# Patient Record
Sex: Female | Born: 1945
Health system: Southern US, Community
[De-identification: ages and names within clinical notes are randomized; demographics above are authoritative.]

## PROBLEM LIST (undated history)

## (undated) DIAGNOSIS — Z961 Presence of intraocular lens: Secondary | ICD-10-CM

## (undated) DIAGNOSIS — H43813 Vitreous degeneration, bilateral: Secondary | ICD-10-CM

## (undated) DIAGNOSIS — N3010 Interstitial cystitis (chronic) without hematuria: Secondary | ICD-10-CM

## (undated) DIAGNOSIS — H527 Unspecified disorder of refraction: Secondary | ICD-10-CM

## (undated) DIAGNOSIS — Z86010 Personal history of colonic polyps: Secondary | ICD-10-CM

## (undated) DIAGNOSIS — K5792 Diverticulitis of intestine, part unspecified, without perforation or abscess without bleeding: Secondary | ICD-10-CM

## (undated) DIAGNOSIS — H40003 Preglaucoma, unspecified, bilateral: Secondary | ICD-10-CM

## (undated) DIAGNOSIS — F329 Major depressive disorder, single episode, unspecified: Secondary | ICD-10-CM

## (undated) DIAGNOSIS — K219 Gastro-esophageal reflux disease without esophagitis: Secondary | ICD-10-CM

## (undated) DIAGNOSIS — H35372 Puckering of macula, left eye: Secondary | ICD-10-CM

## (undated) DIAGNOSIS — N951 Menopausal and female climacteric states: Secondary | ICD-10-CM

## (undated) DIAGNOSIS — T7840XA Allergy, unspecified, initial encounter: Secondary | ICD-10-CM

## (undated) DIAGNOSIS — M542 Cervicalgia: Secondary | ICD-10-CM

## (undated) DIAGNOSIS — M1990 Unspecified osteoarthritis, unspecified site: Secondary | ICD-10-CM

## (undated) DIAGNOSIS — C4360 Malignant melanoma of unspecified upper limb, including shoulder: Secondary | ICD-10-CM

## (undated) HISTORY — DX: Interstitial cystitis (chronic) without hematuria: N30.10

## (undated) HISTORY — DX: Preglaucoma, unspecified, bilateral: H40.003

## (undated) HISTORY — DX: Personal history of colonic polyps: Z86.010

## (undated) HISTORY — DX: Malignant melanoma of unspecified upper limb, including shoulder: C43.60

## (undated) HISTORY — DX: Diverticulitis of intestine, part unspecified, without perforation or abscess without bleeding: K57.92

## (undated) HISTORY — PX: COLONOSCOPY: SHX174

## (undated) HISTORY — DX: Unspecified disorder of refraction: H52.7

## (undated) HISTORY — DX: Gastro-esophageal reflux disease without esophagitis: K21.9

## (undated) HISTORY — PX: TONSILLECTOMY: SHX5217

## (undated) HISTORY — DX: Vitreous degeneration, bilateral: H43.813

## (undated) HISTORY — PX: INCISION AND DRAINAGE ABSCESS ANAL: SUR669

## (undated) HISTORY — DX: Unspecified osteoarthritis, unspecified site: M19.90

## (undated) HISTORY — PX: UPPER GASTROINTESTINAL ENDOSCOPY: SHX188

## (undated) HISTORY — DX: Presence of intraocular lens: Z96.1

## (undated) HISTORY — PX: POLYPECTOMY: SHX149

## (undated) HISTORY — PX: VAGINAL HYSTERECTOMY: SUR661

## (undated) HISTORY — DX: Puckering of macula, left eye: H35.372

## (undated) HISTORY — DX: Menopausal and female climacteric states: N95.1

## (undated) HISTORY — DX: Allergy, unspecified, initial encounter: T78.40XA

## (undated) HISTORY — DX: Major depressive disorder, single episode, unspecified: F32.9

## (undated) HISTORY — DX: Cervicalgia: M54.2

---

## 1998-08-01 ENCOUNTER — Ambulatory Visit (HOSPITAL_COMMUNITY): Admission: RE | Admit: 1998-08-01 | Discharge: 1998-08-01 | Payer: Self-pay | Admitting: *Deleted

## 1998-10-14 ENCOUNTER — Ambulatory Visit (HOSPITAL_COMMUNITY): Admission: RE | Admit: 1998-10-14 | Discharge: 1998-10-14 | Payer: Self-pay | Admitting: Internal Medicine

## 1999-06-25 ENCOUNTER — Ambulatory Visit (HOSPITAL_BASED_OUTPATIENT_CLINIC_OR_DEPARTMENT_OTHER): Admission: RE | Admit: 1999-06-25 | Discharge: 1999-06-25 | Payer: Self-pay | Admitting: *Deleted

## 1999-10-21 ENCOUNTER — Ambulatory Visit (HOSPITAL_COMMUNITY): Admission: RE | Admit: 1999-10-21 | Discharge: 1999-10-21 | Payer: Self-pay | Admitting: Internal Medicine

## 1999-10-21 ENCOUNTER — Encounter: Payer: Self-pay | Admitting: Internal Medicine

## 2000-07-06 ENCOUNTER — Encounter: Payer: Self-pay | Admitting: Internal Medicine

## 2000-07-15 ENCOUNTER — Encounter (INDEPENDENT_AMBULATORY_CARE_PROVIDER_SITE_OTHER): Payer: Self-pay | Admitting: Specialist

## 2000-07-15 ENCOUNTER — Ambulatory Visit (HOSPITAL_COMMUNITY): Admission: RE | Admit: 2000-07-15 | Discharge: 2000-07-15 | Payer: Self-pay | Admitting: *Deleted

## 2000-10-25 ENCOUNTER — Encounter: Payer: Self-pay | Admitting: Internal Medicine

## 2000-10-25 ENCOUNTER — Ambulatory Visit (HOSPITAL_COMMUNITY): Admission: RE | Admit: 2000-10-25 | Discharge: 2000-10-25 | Payer: Self-pay | Admitting: Internal Medicine

## 2003-03-28 ENCOUNTER — Other Ambulatory Visit: Admission: RE | Admit: 2003-03-28 | Discharge: 2003-03-28 | Payer: Self-pay | Admitting: *Deleted

## 2003-04-12 ENCOUNTER — Ambulatory Visit (HOSPITAL_COMMUNITY): Admission: RE | Admit: 2003-04-12 | Discharge: 2003-04-12 | Payer: Self-pay | Admitting: *Deleted

## 2004-11-04 ENCOUNTER — Ambulatory Visit: Payer: Self-pay | Admitting: Internal Medicine

## 2005-03-26 ENCOUNTER — Ambulatory Visit: Payer: Self-pay | Admitting: Internal Medicine

## 2005-04-13 ENCOUNTER — Ambulatory Visit: Payer: Self-pay | Admitting: Internal Medicine

## 2005-04-28 ENCOUNTER — Ambulatory Visit: Payer: Self-pay | Admitting: Family Medicine

## 2005-12-30 ENCOUNTER — Ambulatory Visit: Payer: Self-pay | Admitting: Internal Medicine

## 2006-04-01 ENCOUNTER — Ambulatory Visit: Payer: Self-pay | Admitting: Internal Medicine

## 2006-04-08 ENCOUNTER — Ambulatory Visit: Payer: Self-pay | Admitting: Internal Medicine

## 2007-02-06 ENCOUNTER — Encounter: Payer: Self-pay | Admitting: Internal Medicine

## 2007-04-07 ENCOUNTER — Ambulatory Visit: Payer: Self-pay | Admitting: Internal Medicine

## 2007-04-07 LAB — CONVERTED CEMR LAB
ALT: 30 units/L (ref 0–40)
AST: 30 units/L (ref 0–37)
Albumin: 4.2 g/dL (ref 3.5–5.2)
Alkaline Phosphatase: 69 units/L (ref 39–117)
BUN: 12 mg/dL (ref 6–23)
Basophils Absolute: 0 10*3/uL (ref 0.0–0.1)
Basophils Relative: 0.6 % (ref 0.0–1.0)
Bilirubin, Direct: 0.1 mg/dL (ref 0.0–0.3)
CO2: 31 meq/L (ref 19–32)
Calcium: 9.6 mg/dL (ref 8.4–10.5)
Chloride: 106 meq/L (ref 96–112)
Cholesterol: 212 mg/dL (ref 0–200)
Creatinine, Ser: 0.8 mg/dL (ref 0.4–1.2)
Direct LDL: 110.5 mg/dL
Eosinophils Absolute: 0.3 10*3/uL (ref 0.0–0.6)
Eosinophils Relative: 4.5 % (ref 0.0–5.0)
GFR calc Af Amer: 94 mL/min
GFR calc non Af Amer: 78 mL/min
Glucose, Bld: 82 mg/dL (ref 70–99)
HCT: 40.3 % (ref 36.0–46.0)
HDL: 68.6 mg/dL (ref >39.0)
Hemoglobin: 13.7 g/dL (ref 12.0–15.0)
Lymphocytes Relative: 28.5 % (ref 12.0–46.0)
MCHC: 34 g/dL (ref 30.0–36.0)
MCV: 93.3 fL (ref 78.0–100.0)
Monocytes Absolute: 0.5 10*3/uL (ref 0.2–0.7)
Monocytes Relative: 7.2 % (ref 3.0–11.0)
Neutro Abs: 4.4 10*3/uL (ref 1.4–7.7)
Neutrophils Relative %: 59.2 % (ref 43.0–77.0)
Platelets: 357 10*3/uL (ref 150–400)
Potassium: 3.9 meq/L (ref 3.5–5.1)
RBC: 4.32 M/uL (ref 3.87–5.11)
RDW: 11.9 % (ref 11.5–14.6)
Sodium: 143 meq/L (ref 135–145)
TSH: 2.48 microintl units/mL (ref 0.35–5.50)
Total Bilirubin: 0.9 mg/dL (ref 0.3–1.2)
Total CHOL/HDL Ratio: 3.1
Total Protein: 7.4 g/dL (ref 6.0–8.3)
Triglycerides: 75 mg/dL (ref 0–149)
VLDL: 15 mg/dL (ref 0–40)
WBC: 7.3 10*3/uL (ref 4.5–10.5)

## 2007-04-13 ENCOUNTER — Ambulatory Visit: Payer: Self-pay | Admitting: Internal Medicine

## 2007-04-21 ENCOUNTER — Encounter: Payer: Self-pay | Admitting: Internal Medicine

## 2007-04-21 DIAGNOSIS — M542 Cervicalgia: Secondary | ICD-10-CM

## 2007-04-21 DIAGNOSIS — K612 Anorectal abscess: Secondary | ICD-10-CM

## 2007-04-21 DIAGNOSIS — J309 Allergic rhinitis, unspecified: Secondary | ICD-10-CM | POA: Insufficient documentation

## 2007-04-21 DIAGNOSIS — Z860100 Personal history of colon polyps, unspecified: Secondary | ICD-10-CM

## 2007-04-21 DIAGNOSIS — N951 Menopausal and female climacteric states: Secondary | ICD-10-CM

## 2007-04-21 DIAGNOSIS — C4360 Malignant melanoma of unspecified upper limb, including shoulder: Secondary | ICD-10-CM

## 2007-04-21 HISTORY — DX: Menopausal and female climacteric states: N95.1

## 2007-04-21 HISTORY — DX: Personal history of colon polyps, unspecified: Z86.0100

## 2007-04-21 HISTORY — DX: Personal history of colonic polyps: Z86.010

## 2007-04-21 HISTORY — DX: Cervicalgia: M54.2

## 2007-04-21 HISTORY — DX: Malignant melanoma of unspecified upper limb, including shoulder: C43.60

## 2007-08-30 ENCOUNTER — Encounter: Payer: Self-pay | Admitting: Internal Medicine

## 2007-12-25 ENCOUNTER — Encounter: Payer: Self-pay | Admitting: Internal Medicine

## 2008-01-30 ENCOUNTER — Encounter: Payer: Self-pay | Admitting: Internal Medicine

## 2008-02-08 ENCOUNTER — Encounter: Payer: Self-pay | Admitting: Internal Medicine

## 2008-02-13 ENCOUNTER — Encounter: Payer: Self-pay | Admitting: Internal Medicine

## 2008-02-13 ENCOUNTER — Ambulatory Visit (HOSPITAL_COMMUNITY): Admission: RE | Admit: 2008-02-13 | Discharge: 2008-02-13 | Payer: Self-pay | Admitting: *Deleted

## 2008-02-13 ENCOUNTER — Encounter (INDEPENDENT_AMBULATORY_CARE_PROVIDER_SITE_OTHER): Payer: Self-pay | Admitting: *Deleted

## 2008-02-19 ENCOUNTER — Encounter: Payer: Self-pay | Admitting: Internal Medicine

## 2008-03-26 ENCOUNTER — Encounter: Payer: Self-pay | Admitting: Internal Medicine

## 2008-03-29 ENCOUNTER — Ambulatory Visit: Payer: Self-pay | Admitting: Internal Medicine

## 2008-03-29 LAB — CONVERTED CEMR LAB
ALT: 33 units/L (ref 0–35)
AST: 26 units/L (ref 0–37)
Albumin: 3.8 g/dL (ref 3.5–5.2)
Alkaline Phosphatase: 72 units/L (ref 39–117)
BUN: 13 mg/dL (ref 6–23)
Basophils Absolute: 0 10*3/uL (ref 0.0–0.1)
Basophils Relative: 0.1 % (ref 0.0–1.0)
Bilirubin Urine: NEGATIVE
Bilirubin, Direct: 0.1 mg/dL (ref 0.0–0.3)
CO2: 31 meq/L (ref 19–32)
Calcium: 9.4 mg/dL (ref 8.4–10.5)
Chloride: 106 meq/L (ref 96–112)
Cholesterol: 225 mg/dL (ref 0–200)
Creatinine, Ser: 0.7 mg/dL (ref 0.4–1.2)
Direct LDL: 134.9 mg/dL
Eosinophils Absolute: 0.4 10*3/uL (ref 0.0–0.7)
Eosinophils Relative: 5.6 % — ABNORMAL HIGH (ref 0.0–5.0)
GFR calc Af Amer: 109 mL/min
GFR calc non Af Amer: 90 mL/min
Glucose, Bld: 100 mg/dL — ABNORMAL HIGH (ref 70–99)
Glucose, Urine, Semiquant: NEGATIVE
HCT: 40.3 % (ref 36.0–46.0)
HDL: 64.1 mg/dL (ref >39.0)
Hemoglobin: 13.8 g/dL (ref 12.0–15.0)
Ketones, urine, test strip: NEGATIVE
Lymphocytes Relative: 26.8 % (ref 12.0–46.0)
MCHC: 34.2 g/dL (ref 30.0–36.0)
MCV: 92.3 fL (ref 78.0–100.0)
Monocytes Absolute: 0.5 10*3/uL (ref 0.1–1.0)
Monocytes Relative: 7.1 % (ref 3.0–12.0)
Neutro Abs: 3.9 10*3/uL (ref 1.4–7.7)
Neutrophils Relative %: 60.4 % (ref 43.0–77.0)
Nitrite: NEGATIVE
Platelets: 273 10*3/uL (ref 150–400)
Potassium: 4 meq/L (ref 3.5–5.1)
Protein, U semiquant: NEGATIVE
RBC: 4.36 M/uL (ref 3.87–5.11)
RDW: 11.4 % — ABNORMAL LOW (ref 11.5–14.6)
Sodium: 142 meq/L (ref 135–145)
Specific Gravity, Urine: 1.015
TSH: 1.7 microintl units/mL (ref 0.35–5.50)
Total Bilirubin: 0.7 mg/dL (ref 0.3–1.2)
Total CHOL/HDL Ratio: 3.5
Total Protein: 7.3 g/dL (ref 6.0–8.3)
Triglycerides: 107 mg/dL (ref 0–149)
Urobilinogen, UA: 0.2
VLDL: 21 mg/dL (ref 0–40)
WBC: 6.5 10*3/uL (ref 4.5–10.5)
pH: 6.5

## 2008-04-08 ENCOUNTER — Ambulatory Visit: Payer: Self-pay | Admitting: Internal Medicine

## 2008-04-08 DIAGNOSIS — K219 Gastro-esophageal reflux disease without esophagitis: Secondary | ICD-10-CM | POA: Insufficient documentation

## 2008-04-08 HISTORY — DX: Gastro-esophageal reflux disease without esophagitis: K21.9

## 2008-04-18 ENCOUNTER — Telehealth: Payer: Self-pay | Admitting: Internal Medicine

## 2009-05-19 ENCOUNTER — Ambulatory Visit: Payer: Self-pay | Admitting: Internal Medicine

## 2009-05-26 ENCOUNTER — Ambulatory Visit: Payer: Self-pay | Admitting: Internal Medicine

## 2009-05-26 DIAGNOSIS — F329 Major depressive disorder, single episode, unspecified: Secondary | ICD-10-CM

## 2009-05-26 DIAGNOSIS — F418 Other specified anxiety disorders: Secondary | ICD-10-CM | POA: Insufficient documentation

## 2009-05-26 DIAGNOSIS — F3289 Other specified depressive episodes: Secondary | ICD-10-CM

## 2009-05-26 HISTORY — DX: Major depressive disorder, single episode, unspecified: F32.9

## 2009-05-26 HISTORY — DX: Other specified depressive episodes: F32.89

## 2009-06-02 ENCOUNTER — Telehealth: Payer: Self-pay | Admitting: Internal Medicine

## 2010-05-11 ENCOUNTER — Encounter: Payer: Self-pay | Admitting: Internal Medicine

## 2010-05-20 ENCOUNTER — Encounter: Payer: Self-pay | Admitting: Internal Medicine

## 2010-05-21 ENCOUNTER — Encounter: Payer: Self-pay | Admitting: Internal Medicine

## 2010-08-25 ENCOUNTER — Telehealth: Payer: Self-pay

## 2010-08-27 ENCOUNTER — Encounter: Payer: Self-pay | Admitting: Internal Medicine

## 2010-08-27 ENCOUNTER — Ambulatory Visit: Payer: Self-pay | Admitting: Internal Medicine

## 2010-08-27 LAB — CONVERTED CEMR LAB
ALT: 22 units/L (ref 0–35)
AST: 25 units/L (ref 0–37)
Albumin: 4.1 g/dL (ref 3.5–5.2)
Alkaline Phosphatase: 80 units/L (ref 39–117)
BUN: 12 mg/dL (ref 6–23)
Basophils Absolute: 0 10*3/uL (ref 0.0–0.1)
Basophils Relative: 0.7 % (ref 0.0–3.0)
Bilirubin, Direct: 0 mg/dL (ref 0.0–0.3)
CO2: 29 meq/L (ref 19–32)
Calcium: 9.4 mg/dL (ref 8.4–10.5)
Chloride: 105 meq/L (ref 96–112)
Cholesterol: 223 mg/dL — ABNORMAL HIGH (ref 0–200)
Creatinine, Ser: 0.7 mg/dL (ref 0.4–1.2)
Direct LDL: 139.4 mg/dL
Eosinophils Absolute: 0.4 10*3/uL (ref 0.0–0.7)
Eosinophils Relative: 5.7 % — ABNORMAL HIGH (ref 0.0–5.0)
GFR calc non Af Amer: 89.19 mL/min (ref >60)
Glucose, Bld: 91 mg/dL (ref 70–99)
HCT: 39.5 % (ref 36.0–46.0)
HDL: 57.5 mg/dL (ref >39.00)
Hemoglobin: 13.5 g/dL (ref 12.0–15.0)
Lymphocytes Relative: 27.9 % (ref 12.0–46.0)
Lymphs Abs: 1.9 10*3/uL (ref 0.7–4.0)
MCHC: 34.3 g/dL (ref 30.0–36.0)
MCV: 94.2 fL (ref 78.0–100.0)
Monocytes Absolute: 0.5 10*3/uL (ref 0.1–1.0)
Monocytes Relative: 7.1 % (ref 3.0–12.0)
Neutro Abs: 4.1 10*3/uL (ref 1.4–7.7)
Neutrophils Relative %: 58.6 % (ref 43.0–77.0)
Platelets: 271 10*3/uL (ref 150.0–400.0)
Potassium: 4.9 meq/L (ref 3.5–5.1)
RBC: 4.19 M/uL (ref 3.87–5.11)
RDW: 12 % (ref 11.5–14.6)
Sodium: 140 meq/L (ref 135–145)
TSH: 3.06 microintl units/mL (ref 0.35–5.50)
Total Bilirubin: 0.3 mg/dL (ref 0.3–1.2)
Total CHOL/HDL Ratio: 4
Total Protein: 6.8 g/dL (ref 6.0–8.3)
Triglycerides: 144 mg/dL (ref 0.0–149.0)
VLDL: 28.8 mg/dL (ref 0.0–40.0)
Vit D, 25-Hydroxy: 52 ng/mL (ref 30–89)
WBC: 7 10*3/uL (ref 4.5–10.5)

## 2010-09-24 ENCOUNTER — Telehealth: Payer: Self-pay | Admitting: Internal Medicine

## 2011-01-03 LAB — CONVERTED CEMR LAB
ALT: 23 units/L (ref 0–35)
AST: 24 units/L (ref 0–37)
Albumin: 3.9 g/dL (ref 3.5–5.2)
Alkaline Phosphatase: 66 units/L (ref 39–117)
BUN: 15 mg/dL (ref 6–23)
Basophils Absolute: 0.1 10*3/uL (ref 0.0–0.1)
Basophils Relative: 1.3 % (ref 0.0–3.0)
Bilirubin Urine: NEGATIVE
Bilirubin, Direct: 0.1 mg/dL (ref 0.0–0.3)
CO2: 30 meq/L (ref 19–32)
Calcium: 9.3 mg/dL (ref 8.4–10.5)
Chloride: 106 meq/L (ref 96–112)
Cholesterol: 207 mg/dL — ABNORMAL HIGH (ref 0–200)
Creatinine, Ser: 0.7 mg/dL (ref 0.4–1.2)
Direct LDL: 117.3 mg/dL
Eosinophils Absolute: 0.3 10*3/uL (ref 0.0–0.7)
Eosinophils Relative: 6.5 % — ABNORMAL HIGH (ref 0.0–5.0)
GFR calc non Af Amer: 89.54 mL/min (ref >60)
Glucose, Bld: 97 mg/dL (ref 70–99)
Glucose, Urine, Semiquant: NEGATIVE
HCT: 37.4 % (ref 36.0–46.0)
HDL: 68.7 mg/dL (ref >39.00)
Hemoglobin: 13.2 g/dL (ref 12.0–15.0)
Ketones, urine, test strip: NEGATIVE
Lymphocytes Relative: 30.7 % (ref 12.0–46.0)
Lymphs Abs: 1.6 10*3/uL (ref 0.7–4.0)
MCHC: 35.2 g/dL (ref 30.0–36.0)
MCV: 92.8 fL (ref 78.0–100.0)
Monocytes Absolute: 0.4 10*3/uL (ref 0.1–1.0)
Monocytes Relative: 7.6 % (ref 3.0–12.0)
Neutro Abs: 2.8 10*3/uL (ref 1.4–7.7)
Neutrophils Relative %: 53.9 % (ref 43.0–77.0)
Nitrite: NEGATIVE
Platelets: 236 10*3/uL (ref 150.0–400.0)
Potassium: 4.6 meq/L (ref 3.5–5.1)
Protein, U semiquant: NEGATIVE
RBC: 4.03 M/uL (ref 3.87–5.11)
RDW: 12.4 % (ref 11.5–14.6)
Sodium: 137 meq/L (ref 135–145)
Specific Gravity, Urine: 1.015
TSH: 2.03 microintl units/mL (ref 0.35–5.50)
Total Bilirubin: 0.6 mg/dL (ref 0.3–1.2)
Total CHOL/HDL Ratio: 3
Total Protein: 7.4 g/dL (ref 6.0–8.3)
Triglycerides: 124 mg/dL (ref 0.0–149.0)
Urobilinogen, UA: 0.2
VLDL: 24.8 mg/dL (ref 0.0–40.0)
WBC: 5.2 10*3/uL (ref 4.5–10.5)
pH: 6

## 2011-01-07 NOTE — Assessment & Plan Note (Signed)
Summary: emp/pt coming in fasting/cjr   Vital Signs:  Patient profile:   65 year old female Height:      61.75 inches Weight:      150 pounds BMI:     27.76 Temp:     98.1 degrees F oral BP sitting:   120 / 72  (right arm) Cuff size:   regular  Vitals Entered By: Duard Brady LPN (August 27, 2010 8:44 AM) CC: cpx - doing well  Is Patient Diabetic? No   CC:  cpx - doing well .  History of Present Illness: 65 year old patient who is in today for a wellness exam.  She is followed by gynecology and has a scheduled appointment next month.  She has had a mammogram this year.  She has a history of colonic polyps allergic rhinitis and gastroesophageal reflux disease.  She remains on low-dose hormone replacement therapy and is now on Estrace 1 mg 3 times per week.  She anticipates this p.m., tapered at her next GYN visit. Here for Medicare AWV:  1.   Risk factors based on Past M, S, F history:  no significant cardiovascular risk factors. 2.   Physical Activities: enjoys a 3-minute exercise regimen, almost every day 3.   Depression/mood: history depression, which is now clinically stable has required treatment in the past.  Presently takes alprazolam p.r.n. anxiety 4.   Hearing: no impairment 5.   ADL's: independent in all aspects of daily living 6.   Fall Risk: low 7.   Home Safety: proms identifying 8.   Height, weight, &visual acuity:height and weight stable.  No problems with visual acuity 9.   Counseling: regular exercise, calcium supplementation with vitamin D.  All encouraged 10.   Labs ordered based on risk factors: laboratory profile, including lipid panel will be reviewed 11.           Referral Coordination- follow-up GYN as scheduled 12.           Care Plan- colonoscopy due in 3 years 13.            Cognitive Assessment- alert and oriented normal affect.  No memory disturbance.  No difficulty handling all executive functions   Allergies: 1)  ! Biaxin 2)  Amoxicillin  (Amoxicillin) 3)  Erythromycin Ethylsuccinate (Erythromycin Ethylsuccinate) 4)  Paxil (Paroxetine Hcl) 5)  Periostat (Doxycycline Hyclate)  Past History:  Past Medical History: Reviewed history from 05/26/2009 and no changes required. 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 details of her father's health unknown mother died age 21 from bladder cancer; also status post resection for lung cancer  One brother:  history of hypertension one sister.  History hypertension, obesity, and dyslipidemia, CNS hemorrhage  Aunt:  breast cancer, cerebral hemorrhage  Social History: Reviewed history from 04/08/2008 and no changes required. Married discontinued tobacco 20 years ago retired May 2009  Review of Systems  The patient denies anorexia, fever, weight loss, weight gain, vision loss, decreased hearing, hoarseness, chest pain, syncope, dyspnea on exertion, peripheral edema, prolonged cough, headaches, hemoptysis, abdominal pain, melena, hematochezia, severe indigestion/heartburn, hematuria, incontinence, genital sores, muscle weakness, suspicious skin lesions, transient blindness, difficulty walking, depression, unusual weight change, abnormal bleeding, enlarged lymph nodes, angioedema, and breast masses.    Physical Exam  General:  Well-developed,well-nourished,in no  acute distress; alert,appropriate and cooperative throughout examination Head:  Normocephalic and atraumatic without obvious abnormalities. No apparent alopecia or balding. Eyes:  No corneal or conjunctival inflammation noted. EOMI. Perrla. Funduscopic exam benign, without hemorrhages, exudates or papilledema. Vision grossly normal. Ears:   External ear exam shows no significant lesions or deformities.  Otoscopic examination reveals clear canals, tympanic membranes are intact bilaterally without bulging, retraction, inflammation or discharge. Hearing is grossly normal bilaterally. Nose:  External nasal examination shows no deformity or inflammation. Nasal mucosa are pink and moist without lesions or exudates. Mouth:  Oral mucosa and oropharynx without lesions or exudates.  Teeth in good repair. Neck:  No deformities, masses, or tenderness noted. Chest Wall:  No deformities, masses, or tenderness noted. Breasts:  No mass, nodules, thickening, tenderness, bulging, retraction, inflamation, nipple discharge or skin changes noted.   Lungs:  Normal respiratory effort, chest expands symmetrically. Lungs are clear to auscultation, no crackles or wheezes. Heart:  Normal rate and regular rhythm. S1 and S2 normal without gallop, murmur, click, rub or other extra sounds. Abdomen:  Bowel sounds positive,abdomen soft and non-tender without masses, organomegaly or hernias noted. Msk:  No deformity or scoliosis noted of thoracic or lumbar spine.   Pulses:  R and L carotid,radial,femoral,dorsalis pedis and posterior tibial pulses are full and equal bilaterally Extremities:  No clubbing, cyanosis, edema, or deformity noted with normal full range of motion of all joints.   Neurologic:  No cranial nerve deficits noted. Station and gait are normal. Plantar reflexes are down-going bilaterally. DTRs are symmetrical throughout. Sensory, motor and coordinative functions appear intact. Skin:  Intact without suspicious lesions or rashes Cervical Nodes:  No lymphadenopathy noted Axillary Nodes:  No palpable lymphadenopathy Inguinal Nodes:  No significant adenopathy Psych:  Cognition and judgment appear intact. Alert and cooperative with normal attention span and concentration. No apparent delusions, illusions, hallucinations   Impression &  Recommendations:  Problem # 1:  PHYSICAL EXAMINATION (ICD-V70.0)  Orders: EKG w/ Interpretation (93000) Medicare -1st Annual Wellness Visit [phone]) Venipuncture (60454) TLB-Lipid Panel (80061-LIPID) TLB-BMP (Basic Metabolic Panel-BMET) (80048-METABOL) TLB-CBC Platelet - w/Differential (85025-CBCD) TLB-Hepatic/Liver Function Pnl (80076-HEPATIC) TLB-TSH (Thyroid Stimulating Hormone) (84443-TSH)  Complete Medication List: 1)  Estrace 1 Mg Tabs (Estradiol) .... Take 1 tablet by mouth once a day 2)  Xanax 0.5 Mg Tabs (Alprazolam) .... Take 1 tablet by mouth once a day 3)  Prevacid 30 Mg Cpdr (Lansoprazole) .... One daily 4)  Flonase 50 Mcg/act Susp (Fluticasone propionate) .... Use daily 5)  Detrol La 4 Mg Xr24h-cap (Tolterodine tartrate) .... One daily  Other Orders: T-Vitamin D (25-Hydroxy) [phone])  Patient Instructions: 1)  Please schedule a follow-up appointment in 1 year. 2)  Avoid foods high in acid (tomatoes, citrus juices, spicy foods). Avoid eating within two hours of lying down or before exercising. Do not over eat; try smaller more frequent meals. Elevate head of bed twelve inches when sleeping. 3)  It is important that you exercise regularly at least 20 minutes 5 times a week. If you develop chest pain, have severe difficulty breathing, or feel very tired , stop exercising immediately and seek medical attention. 4)  Take calcium +Vitamin D daily. Prescriptions: DETROL LA 4 MG XR24H-CAP (TOLTERODINE TARTRATE) one daily  #90 x 4   Entered and Authorized by:   Gordy Savers  MD   Signed by:   Gordy Savers  MD on 08/27/2010   Method used:   Print then Give to Patient   RxID:  [id number] FLONASE 50 MCG/ACT  SUSP (FLUTICASONE PROPIONATE) use daily  #3 x 4   Entered and Authorized by:   Gordy Savers  MD   Signed by:   Gordy Savers  MD on 08/27/2010   Method used:   Print then Give to Patient   RxID:   [id number] PREVACID 30 MG   CPDR (LANSOPRAZOLE) one daily  #90 x 4   Entered and Authorized by:   Gordy Savers  MD   Signed by:   Gordy Savers  MD on 08/27/2010   Method used:   Print then Give to Patient   RxID:   [id number] Prudy Feeler 0.5 MG TABS (ALPRAZOLAM) Take 1 tablet by mouth once a day  #90 x 4   Entered and Authorized by:   Gordy Savers  MD   Signed by:   Gordy Savers  MD on 08/27/2010   Method used:   Print then Give to Patient   RxID:   [id number] ESTRACE 1 MG TABS (ESTRADIOL) Take 1 tablet by mouth once a day  #90 x 4   Entered and Authorized by:   Gordy Savers  MD   Signed by:   Gordy Savers  MD on 08/27/2010   Method used:   Print then Give to Patient   RxID:   [id number]   Appended Document: emp/pt coming in fasting/cjr  Flu Vaccine Consent Questions     Do you have a history of severe allergic reactions to this vaccine? no    Any prior history of allergic reactions to egg and/or gelatin? no    Do you have a sensitivity to the preservative Thimersol? no    Do you have a past history of Guillan-Barre Syndrome? no    Do you currently have an acute febrile illness? no    Have you ever had a severe reaction to latex? no    Vaccine information given and explained to patient? yes    Are you currently pregnant? no    Lot Number:[id number]   Exp Date:06/05/2011   Site Given  Left Deltoid IM    Allergies: 1)  ! Biaxin 2)  Amoxicillin (Amoxicillin) 3)  Erythromycin Ethylsuccinate (Erythromycin Ethylsuccinate) 4)  Paxil (Paroxetine Hcl) 5)  Periostat (Doxycycline Hyclate)   Complete Medication List: 1)  Estrace 1 Mg Tabs (Estradiol) .... Take 1 tablet by mouth once a day 2)  Xanax 0.5 Mg Tabs (Alprazolam) .... Take 1 tablet by mouth once a day 3)  Prevacid 30 Mg Cpdr (Lansoprazole) .... One daily 4)  Flonase 50 Mcg/act Susp (Fluticasone propionate) .... Use daily 5)  Detrol La 4 Mg Xr24h-cap (Tolterodine tartrate) .... One daily  Other  Orders: Flu Vaccine 59yrs + MEDICARE PATIENTS (O2703) Administration Flu vaccine - MCR (J0093)  Appended Document: Orders Update    Clinical Lists Changes  Orders: Added new Service order of Specimen Handling (81829) - Signed

## 2011-01-07 NOTE — Progress Notes (Signed)
Summary: Pt req to have Vit D lvl added to emp labs on 08/27/10  Phone Note Call from Patient Call back at [facility] Phone [phone] Call back at [phone] cell    pt says to call either number.    Caller: Patient Summary of Call: Pt called and is req to get Vit D lvl added to emp labs on 08/27/10. Pls advise. Pt said that theire last Vit D lvl was checked by [employer] June 2010 and it was lvl 16 and she was put on 50k units per week for 6 weeks.  Initial call taken by: Lucy Antigua,  August 25, 2010 11:42 AM  Follow-up for Phone Call        spoke with pt - will do at cpx on 9/22 . KIK Follow-up by: Duard Brady LPN,  August 25, 2010 12:11 PM

## 2011-01-07 NOTE — Letter (Signed)
Summary: [facility]  [facility]   Imported By: Maryln Gottron 05/19/2010 15:57:52  _____________________________________________________________________  External Attachment:    Type:   Image     Comment:   External Document

## 2011-01-07 NOTE — Procedures (Signed)
Summary: Upper GI Endoscopy/[facility]  Upper GI Endoscopy/[facility]   Imported By: Maryln Gottron 05/27/2010 11:27:58  _____________________________________________________________________  External Attachment:    Type:   Image     Comment:   External Document

## 2011-01-07 NOTE — Progress Notes (Signed)
Summary: bloodwork results  Phone Note Call from Patient Call back at Home Phone [phone]   Caller: Patient Call For: Gordy Savers  MD Summary of Call: pt needs blood work results Initial call taken by: Heron Sabins,  September 24, 2010 12:19 PM  Follow-up for Phone Call        pt informed Follow-up by: Willy Eddy, LPN,  September 24, 2010 12:43 PM    all ok including vit d level

## 2011-01-19 ENCOUNTER — Encounter: Payer: Self-pay | Admitting: Internal Medicine

## 2011-01-20 ENCOUNTER — Encounter: Payer: Self-pay | Admitting: Internal Medicine

## 2011-01-20 ENCOUNTER — Ambulatory Visit (INDEPENDENT_AMBULATORY_CARE_PROVIDER_SITE_OTHER): Payer: Medicare Other | Admitting: Internal Medicine

## 2011-01-20 DIAGNOSIS — J069 Acute upper respiratory infection, unspecified: Secondary | ICD-10-CM

## 2011-01-20 DIAGNOSIS — J309 Allergic rhinitis, unspecified: Secondary | ICD-10-CM

## 2011-01-20 NOTE — Progress Notes (Signed)
  Subjective:    Patient ID: Victoria Bryant, female    DOB: 24-Jul-1946, 65 y.o.   MRN: [id number]  HPI  And 65 year old patient who is seen today with aseveral day history of chest congestion and some minimal wheezing.  She does have a history of allergic rhinitis and has been using nasal steroids chronically.  No productive cough.  No fever no shortness of breath.  She has been using some Robitussin.    Review of Systems  Constitutional: Negative.   HENT: Negative for hearing loss, congestion, sore throat, rhinorrhea, dental problem, sinus pressure and tinnitus.   Eyes: Negative for pain, discharge and visual disturbance.  Respiratory: Positive for wheezing. Negative for cough and shortness of breath.   Cardiovascular: Negative for chest pain, palpitations and leg swelling.  Gastrointestinal: Negative for nausea, vomiting, abdominal pain, diarrhea, constipation, blood in stool and abdominal distention.  Genitourinary: Negative for dysuria, urgency, frequency, hematuria, flank pain, vaginal bleeding, vaginal discharge, difficulty urinating, vaginal pain and pelvic pain.  Musculoskeletal: Negative for joint swelling, arthralgias and gait problem.  Skin: Negative for rash.  Neurological: Negative for dizziness, syncope, speech difficulty, weakness, numbness and headaches.  Hematological: Negative for adenopathy.  Psychiatric/Behavioral: Negative for behavioral problems, dysphoric mood and agitation. The patient is not nervous/anxious.        Objective:   Physical Exam  Constitutional: She is oriented to person, place, and time. She appears well-developed and well-nourished.  HENT:  Head: Normocephalic.  Right Ear: External ear normal.  Left Ear: External ear normal.  Mouth/Throat: Oropharynx is clear and moist.  Eyes: Conjunctivae and EOM are normal. Pupils are equal, round, and reactive to light.  Neck: Normal range of motion. Neck supple. No thyromegaly present.  Cardiovascular:  Normal rate, regular rhythm, normal heart sounds and intact distal pulses.   Pulmonary/Chest: Effort normal and breath sounds normal. No respiratory distress. She has no wheezes. She has no rales. She exhibits no tenderness.  Abdominal: Soft. Bowel sounds are normal. She exhibits no mass. There is no tenderness.  Musculoskeletal: Normal range of motion.  Lymphadenopathy:    She has no cervical adenopathy.  Neurological: She is alert and oriented to person, place, and time.  Skin: Skin is warm and dry. No rash noted.  Psychiatric: She has a normal mood and affect. Her behavior is normal.          Assessment & Plan:  Viral URI- Will treat symptomatically will continue Robitussin will add a nonsedating antihistamine Allergic rhinitis

## 2011-01-20 NOTE — Patient Instructions (Signed)
Get plenty of rest, Drink lots of  clear liquids, and use Tylenol or ibuprofen for fever and discomfort.    Call or return to clinic prn if these symptoms worsen or fail to improve as anticipated.  

## 2011-04-20 NOTE — Letter (Signed)
Apr 13, 2007     RE:  RACHAL, DVORSKY  MRN:  [id number]  /  DOB:  08/20/1946   To whom it may concern:   Ms. Schermerhorn is a patient followed in our internal medicine practice.  She is 65 years old and has had in the past a history of neck pain.  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  She was last seen in our office  on Apr 13, 2007.  No complaints of neck pain.  She did have a cervical  spine x-ray series performed on Apr 12, 2006.  This revealed some mild  spondylosis with mild disk space narrowing and was really unremarkable.  The patient's cervical spine x-rays revealed no significant pathology,  and were compatible with her age.  Again, at the present time, she has  no symptoms referable to her neck.   I hope the above information is helpful.  If further details are  required, please do not hesitate to contact this office.    Sincerely,      Gordy Savers, MD  Electronically Signed    PFK/MedQ  DD: 04/13/2007  DT: 04/13/2007  Job #: [id number]

## 2011-04-20 NOTE — Op Note (Signed)
Victoria Bryant, Victoria Bryant            ACCOUNT NO.:  [id number]   MEDICAL RECORD NO.:  [id number]          PATIENT TYPE:  AMB   LOCATION:  ENDO                         FACILITY:  [facility]   PHYSICIAN:  Georgiana Spinner, M.D.    DATE OF BIRTH:  1946/01/17   DATE OF PROCEDURE:  02/13/2008  DATE OF DISCHARGE:                               OPERATIVE REPORT   PROCEDURE:  Upper endoscopy.   INDICATIONS:  GERD.   ANESTHESIA:  Demerol 75 mg; Versed 7.5 mg.   PROCEDURE:  With the patient mildly sedated in the left lateral  decubitus position, the Pentax videoscopic endoscope was inserted in the  mouth and passed under direct vision through the esophagus where there  was a [address] of what appeared to be Barrett's esophagus,  photographed and biopsied.  We entered into the stomach.  Fundus showed  erythematous changes scattered throughout the fundus that were  photographed and also biopsied.  We advanced to body, antrum, duodenal  bulb, second portion of duodenum, and all appeared normal.  From this  point the endoscope was slowly withdrawn, taking circumferential views  of the duodenal mucosa until the endoscope had been pulled back into the  stomach and placed in retroflexion to view the stomach from below.  The  endoscope was straightened and withdrawn, taking circumferential views  of the remaining gastric and esophageal mucosa.  The patient's vital  signs and pulse oximeter remained stable.  The patient tolerated the  procedure well without apparent complications.   FINDINGS:  Small island of what appeared to be Barrett's esophagus in  the distal esophagus photographed and biopsied.  Erythematous changes of  possible gastritis seen in the fundus of the stomach, somewhat scattered  in this in this area as well, also photographed and biopsied.  Await  biopsy reports.  The patient will call me for results and follow up with  me as an outpatient.  Proceed to colonoscopy as planned.      ______________________________  Georgiana Spinner, M.D.     GMO/MEDQ  D:  02/13/2008  T:  02/14/2008  Job:  [id number]   cc:   Gordy Savers, MD  [address] [place]  [place] [address]

## 2011-04-20 NOTE — Assessment & Plan Note (Signed)
[facility]                            [facility] OFFICE NOTE   NAME:Victoria Bryant, Victoria Bryant                   MRN:          [id number]  DATE:04/13/2007                            DOB:          05/23/1946    A 65 year old white female seen today for a wellness exam.  She is  followed by gynecology, also urology, and Dr. Virginia Rochester; is scheduled for  another colonoscopy in 2009.  She has a history of menopausal syndrome,  colonic polyps.  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   Medical regimen includes:  1. Hormone replacement therapy.  2. P.r.n. Xanax.   REVIEW OF SYSTEMS:  Exam is negative.  Did have a colonoscopy in 2004.   FAMILY HISTORY:  Unchanged.  Positive for hypertension and dyslipidemia.   EXAMINATION:  Revealed a healthy-appearing female, in no acute distress.  She was mildly overweight, blood pressure 130/80.  SKIN:  Negative.  FUNDI, EAR, NOSE, AND THROAT:  Clear.  NECK:  No adenopathy or bruits.  The right carotid upstroke was perhaps  slightly diminished.  CHEST:  Clear.  BREAST:  Negative.  CARDIOVASCULAR:  Normal heart sounds, no murmurs.  ABDOMEN:  Benign, no organomegaly.  EXTREMITIES:  Negative with intact peripheral pulses.  NEURO:  Negative.   IMPRESSION:  1. Colonic polyps.  2. Menopausal syndrome.  3. History of seasonal allergic rhinitis.   DISPOSITION:  Medical regimen unchanged, will recheck in one year.  Prescriptions dispensed.     Gordy Savers, MD  Electronically Signed    PFK/MedQ  DD: 04/13/2007  DT: 04/13/2007  Job #: [id number]

## 2011-04-20 NOTE — Op Note (Signed)
Victoria Bryant, FERGESON            ACCOUNT NO.:  [id number]   MEDICAL RECORD NO.:  [id number]          PATIENT TYPE:  AMB   LOCATION:  ENDO                         FACILITY:  [facility]   PHYSICIAN:  Georgiana Spinner, M.D.    DATE OF BIRTH:  [birth year]   DATE OF PROCEDURE:  DATE OF DISCHARGE:                               OPERATIVE REPORT   PROCEDURE:  Colonoscopy.   INDICATIONS:  Colon cancer screening and colon polyps.   ANESTHESIA:  Demerol 50 mg and Versed 5 mg.   PROCEDURE:  With the patient mildly sedated in the left lateral  decubitus position, the Pentax videoscopic colonoscope was inserted in  the rectum and passed under direct vision with pressure applied to reach  the cecum, identified by crow's foot of the cecum, ileocecal valve and  we entered into the terminal ileum through the ileocecal valve which  also appeared normal and was photographed.  From this point, the  colonoscope was slowly withdrawn, taking circumferential views of  colonic mucosa and stopping only in the rectum which appeared normal on  direct and showed hemorrhoids on retroflexed view.  The endoscope was  straightened and withdrawn.  The patient's vital signs and pulse  oximeter remained stable.  The patient tolerated the procedure well  without apparent complications.   FINDINGS:  Internal hemorrhoids.  Question of possibly early melanosis  coli.  Question laxative abuse.  We will discuss with the patient as an  outpatient.           ______________________________  Georgiana Spinner, M.D.     GMO/MEDQ  D:  02/13/2008  T:  02/14/2008  Job:  [id number]   cc:   Gordy Savers, MD  [address] [place]  [place] [address]

## 2011-04-23 NOTE — Op Note (Signed)
   NAME:  Victoria Bryant, Victoria Bryant                      ACCOUNT NO.:  [id number]   MEDICAL RECORD NO.:  [id number]                   PATIENT TYPE:  AMB   LOCATION:  ENDO                                 FACILITY:  [facility]   PHYSICIAN:  Georgiana Spinner, M.D.                 DATE OF BIRTH:  1946-10-30   DATE OF PROCEDURE:  04/12/2003  DATE OF DISCHARGE:                                 OPERATIVE REPORT   PROCEDURE:  Colonoscopy.   INDICATIONS FOR PROCEDURE:  History of colon polyps removed in the past.   ANESTHESIA:  Demerol 90, Versed 9 mg.   DESCRIPTION OF PROCEDURE:  With the patient mildly sedated in the left  lateral decubitus position, the Olympus videoscopic colonoscope was inserted  into the rectum and passed under direct vision to the cecum identified by  the ileocecal valve and appendiceal orifice. The prep was slightly  suboptimal. The patient spent an hour taking the prep only but visualization  was adequate. From this point, the colonoscope was slowly withdrawn taking  circumferential views of the entire colonic mucosa stopping then only in the  rectum which appeared normal on direct and showed large internal hemorrhoids  on retroflexed view. The endoscope was straightened and withdrawn. The  patient's vital signs and pulse oximeter remained stable. The patient  tolerated the procedure well without apparent complications.   FINDINGS:  Internal hemorrhoids, otherwise, unremarkable examination.   PLAN:  Repeat examination in five years.                                               Georgiana Spinner, M.D.    GMO/MEDQ  D:  04/12/2003  T:  04/12/2003  Job:  [id number]   cc:   Gordy Savers, M.D. [facility]

## 2011-04-23 NOTE — Procedures (Signed)
[facility]  Patient:    Victoria Bryant, BASTONE                   MRN: [id number] Proc. Date: 07/15/00 Adm. Date:  [id number] Attending:  Sabino Gasser CC:         Gordy Savers, M.D.   Procedure Report  PROCEDURE:  Upper endoscopy with biopsy.  INDICATIONS FOR PROCEDURE:  Abdominal pain.  ANESTHESIA:  Demerol 75 mg, Versed 6 mg given intravenously in divided dose.  See my clinical noted attached. The patient presented with abdominal pain not relieved by proton pump inhibitor different from her reflux pain.  DESCRIPTION OF PROCEDURE:  With the patient mildly sedated in the left lateral decubitus position, the Olympus video endoscope was inserted in the mouth and passed under direct vision through the esophagus which appeared normal until we reached the distal esophagus where there was a question of a small segment Barretts. The esophagus photographed and subsequently biopsied. We entered into the stomach and the fundus, body and antrum were well visualized. Photographs were taken. We entered into the duodenal bulb and second portion of the duodenum both of which appeared normal and were photographed. From this point, the endoscope was slowly withdrawn taking circumferential views of the entire duodenal mucosa until the endoscope was then pulled back into the stomach, placed in retroflexion to view the stomach from below and this showed a hiatal hernia. The endoscope was straightened and pulled back from distal to proximal stomach taking circumferential views of the entire gastric mucosa and there were changes in the fundus between the folds of villous type nature resembling intestinal metaplasia. This was photographed and biopsied and once obtained, the endoscope was then withdrawn taking circumferential views of the remaining gastric and esophageal mucosa.  The patients vital signs and pulse oximeter remained stable. The patient tolerated the  procedure well and there were no apparent complications.  FINDINGS:  Question of gastritis in the fundus of the stomach, photographed and biopsies taken. Await biopsy report. The patient will call me for results and follow-up with me as an outpatient. Continue present therapy. The patient continues to have abdominal pain.  PLAN:  As above. Proceed also to colonoscopy at the patients request. DD: 07/15/00 TD:  07/16/00 Job: [id number]

## 2011-04-23 NOTE — Procedures (Signed)
[facility]  Patient:    Victoria Bryant, Victoria Bryant                   MRN: [id number] Proc. Date: 07/15/00 Adm. Date:  [id number] Disc. Date: [id number] Attending:  Sabino Gasser                           Procedure Report  PROCEDURE PERFORMED:  Colonoscopy with biopsy.  ENDOSCOPIST:  Sabino Gasser, M.D.  INDICATIONS:  Colon polyps for followup.  ANESTHESIA:  Demerol an additional 25 mg and Versed 3 mg given intravenously in divided doses.  DESCRIPTION OF PROCEDURE:  With the patient mildly sedated in the left lateral decubitus position, the Olympus videoscopic colonoscope was inserted into the rectum and passed under direct vision to the cecum.  The cecum identified by the ileocecal valve and appendiceal orifice both of which were photographed. From this point, the colonoscope was slowly withdrawn taking circumferential views of the entire colonic mucosa stopping only then at approximately 10 cm from the anal verge at which point a small polyp was seen, photographed and removed using hot biopsy forceps technique with a setting of 20/20 blended current.  The endoscope was then withdrawn after taking a retroflex view of the rectum which appeared normal.  The patients vital signs and pulse oximeter remained stable.  The patient tolerated the procedure well without apparent complications.  FINDINGS:  Small polyp at 10 cm, await biopsy result.  PLAN:  The patient will call me for results and follow up with me as an outpatient. DD:  07/15/00 TD:  07/16/00 Job: [id number]

## 2011-09-20 ENCOUNTER — Encounter: Payer: Self-pay | Admitting: Internal Medicine

## 2011-09-20 ENCOUNTER — Ambulatory Visit (INDEPENDENT_AMBULATORY_CARE_PROVIDER_SITE_OTHER): Payer: Medicare Other | Admitting: Internal Medicine

## 2011-09-20 VITALS — BP 104/70 | HR 72 | Temp 98.2°F | Resp 16 | Ht 63.0 in | Wt 154.0 lb

## 2011-09-20 DIAGNOSIS — J309 Allergic rhinitis, unspecified: Secondary | ICD-10-CM

## 2011-09-20 DIAGNOSIS — Z136 Encounter for screening for cardiovascular disorders: Secondary | ICD-10-CM

## 2011-09-20 DIAGNOSIS — Z86010 Personal history of colonic polyps: Secondary | ICD-10-CM

## 2011-09-20 DIAGNOSIS — Z0000 Encounter for general adult medical examination without abnormal findings: Secondary | ICD-10-CM

## 2011-09-20 DIAGNOSIS — Z23 Encounter for immunization: Secondary | ICD-10-CM

## 2011-09-20 DIAGNOSIS — F329 Major depressive disorder, single episode, unspecified: Secondary | ICD-10-CM

## 2011-09-20 DIAGNOSIS — N951 Menopausal and female climacteric states: Secondary | ICD-10-CM

## 2011-09-20 DIAGNOSIS — K219 Gastro-esophageal reflux disease without esophagitis: Secondary | ICD-10-CM

## 2011-09-20 LAB — CBC WITH DIFFERENTIAL/PLATELET
Basophils Relative: 0.7 % (ref 0.0–3.0)
Eosinophils Absolute: 0.4 10*3/uL (ref 0.0–0.7)
Eosinophils Relative: 5.5 % — ABNORMAL HIGH (ref 0.0–5.0)
HCT: 39.9 % (ref 36.0–46.0)
Lymphs Abs: 2.2 10*3/uL (ref 0.7–4.0)
MCHC: 33.4 g/dL (ref 30.0–36.0)
MCV: 93.2 fl (ref 78.0–100.0)
Monocytes Absolute: 0.5 10*3/uL (ref 0.1–1.0)
Neutrophils Relative %: 56.7 % (ref 43.0–77.0)
Platelets: 275 10*3/uL (ref 150.0–400.0)
RBC: 4.28 Mil/uL (ref 3.87–5.11)
WBC: 7.4 10*3/uL (ref 4.5–10.5)

## 2011-09-20 LAB — COMPREHENSIVE METABOLIC PANEL
AST: 21 U/L (ref 0–37)
Albumin: 4.1 g/dL (ref 3.5–5.2)
Alkaline Phosphatase: 79 U/L (ref 39–117)
Glucose, Bld: 85 mg/dL (ref 70–99)
Potassium: 4.4 mEq/L (ref 3.5–5.1)
Sodium: 142 mEq/L (ref 135–145)
Total Bilirubin: 0.5 mg/dL (ref 0.3–1.2)
Total Protein: 7.3 g/dL (ref 6.0–8.3)

## 2011-09-20 LAB — LIPID PANEL
HDL: 62.7 mg/dL (ref >39.00)
Triglycerides: 167 mg/dL — ABNORMAL HIGH (ref 0.0–149.0)
VLDL: 33.4 mg/dL (ref 0.0–40.0)

## 2011-09-20 MED ORDER — FLUTICASONE PROPIONATE 50 MCG/ACT NA SUSP: 1.0000 | Freq: Every day | NASAL | Status: DC

## 2011-09-20 MED ORDER — ESTRADIOL 1 MG PO TABS: 1.0000 mg | ORAL_TABLET | Freq: Every day | ORAL | Status: DC

## 2011-09-20 MED ORDER — TOLTERODINE TARTRATE ER 4 MG PO CP24: 4.0000 mg | ORAL_CAPSULE | Freq: Every day | ORAL | Status: DC

## 2011-09-20 MED ORDER — PANTOPRAZOLE SODIUM 40 MG PO TBEC: 40.0000 mg | DELAYED_RELEASE_TABLET | Freq: Every day | ORAL | Status: DC

## 2011-09-20 MED ORDER — MELOXICAM 7.5 MG PO TABS: 7.5000 mg | ORAL_TABLET | Freq: Every day | ORAL | Status: AC

## 2011-09-20 MED ORDER — ALPRAZOLAM 0.5 MG PO TABS: 0.5000 mg | ORAL_TABLET | Freq: Every day | ORAL | Status: DC

## 2011-09-20 MED ORDER — MELOXICAM 7.5 MG PO TABS: 7.5000 mg | ORAL_TABLET | Freq: Every day | ORAL | Status: DC

## 2011-09-20 NOTE — Progress Notes (Signed)
Subjective:    Victoria Bryant ID: Victoria Bryant, female    DOB: 1946-06-13, 65 y.o.   MRN: [id number]  HPI  65 year old Victoria Bryant who is seen today for a preventative health examination. She enjoys excellent health. Her only complaint today is pain involving her left second toe this occurred after a considerable activity on her feet. She does have a history of hammertoe deformity and did have this cooperative on in the past. She has a history of anxiety depression and some chronic neck pain. She has a history of colonic polyps and allergic rhinitis she has remote history of a malignant melanoma. She had a mammogram last week and is scheduled for gynecologic followup tomorrow.  Here for Medicare AWV:   1. Risk factors based on Past M, S, F history: no significant cardiovascular risk factors.  2. Physical Activities: enjoys a 3-minute exercise regimen, almost every day  3. Depression/mood: history depression, which is now clinically stable has required treatment in the past. Presently takes alprazolam p.r.n. anxiety  4. Hearing: no impairment  5. ADL's: independent in all aspects of daily living  6. Fall Risk: low  7. Home Safety: No problems  identified  8. Height, weight, &visual acuity:height and weight stable. No problems with visual acuity  9. Counseling: regular exercise, calcium supplementation with vitamin D. All encouraged  10. Labs ordered based on risk factors: laboratory profile, including lipid panel will be reviewed  11. Referral Coordination- follow-up GYN as scheduled  12. Care Plan- colonoscopy due in 2 years  13. Cognitive Assessment- alert and oriented normal affect. No memory disturbance. No difficulty handling all executive functions   Allergies:  1) ! Biaxin  2) Amoxicillin (Amoxicillin)  3) Erythromycin Ethylsuccinate (Erythromycin Ethylsuccinate)  4) Paxil (Paroxetine Hcl)  5) Periostat (Doxycycline Hyclate)   Past History:  Past Medical History:  Reviewed history  from 05/26/2009 and no changes required.  Allergic rhinitis  Colonic polyps, hx of  GERD  Palpitations  Depression   Past Surgical History:  Hysterectomy 1976  Tonsillectomy  colonoscopy and upper gastroduodenoscopy 2009,  EGD 2011  melanoma resection, left arm, 1991  history rectal abscess   Family History:  Reviewed history from 05/26/2009 and no changes required.  details of her father's health unknown  mother died age 44 from bladder cancer; also status post resection for lung cancer  One brother: history of hypertension  one sister. History hypertension, obesity, and dyslipidemia, CNS hemorrhage  Aunt: breast cancer, cerebral hemorrhage  Social History:  Reviewed history from 04/08/2008 and no changes required.  Married  discontinued tobacco 20 years ago  retired May 2009    Past Medical History  Diagnosis Date  . COLONIC POLYPS, HX OF 04/21/2007  . DEPRESSION 05/26/2009  . GERD 04/08/2008  . 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 MENOPAUSAL SYNDROME 04/21/2007  . NECK PAIN, CHRONIC 04/21/2007   Past Surgical History  Procedure Date  . Abdominal hysterectomy   . Tonsillectomy   . Incision and drainage abscess anal     reports that she quit smoking about 37 years ago. She has never used smokeless tobacco. She reports that she does not drink alcohol or use illicit drugs. family history is not on file. Allergies  Allergen Reactions  . Amoxicillin     REACTION: unspecified  . Clarithromycin   . Doxycycline Hyclate     REACTION: unspecified  . Erythromycin Ethylsuccinate     REACTION: unspecified  . Paroxetine     REACTION: unspecified  Review of Systems  Constitutional: Negative for fever, appetite change, fatigue and unexpected weight change.  HENT: Negative for hearing loss, ear pain, nosebleeds, congestion, sore throat, mouth sores, trouble swallowing, neck stiffness, dental problem, voice change, sinus pressure and tinnitus.   Eyes: Negative for  photophobia, pain, redness and visual disturbance.  Respiratory: Negative for cough, chest tightness and shortness of breath.   Cardiovascular: Negative for chest pain, palpitations and leg swelling.  Gastrointestinal: Negative for nausea, vomiting, abdominal pain, diarrhea, constipation, blood in stool, abdominal distention and rectal pain.  Genitourinary: Negative for dysuria, urgency, frequency, hematuria, flank pain, vaginal bleeding, vaginal discharge, difficulty urinating, genital sores, vaginal pain, menstrual problem and pelvic pain.  Musculoskeletal: Negative for back pain and arthralgias.       Left second toe pain recent onset  Skin: Negative for rash.  Neurological: Negative for dizziness, syncope, speech difficulty, weakness, light-headedness, numbness and headaches.  Hematological: Negative for adenopathy. Does not bruise/bleed easily.  Psychiatric/Behavioral: Negative for suicidal ideas, behavioral problems, self-injury, dysphoric mood and agitation. The Victoria Bryant is not nervous/anxious.        Objective:   Physical Exam  Constitutional: She is oriented to person, place, and time. She appears well-developed and well-nourished.  HENT:  Head: Normocephalic and atraumatic.  Right Ear: External ear normal.  Left Ear: External ear normal.  Mouth/Throat: Oropharynx is clear and moist.  Eyes: Conjunctivae and EOM are normal.  Neck: Normal range of motion. Neck supple. No JVD present. No thyromegaly present.  Cardiovascular: Normal rate, regular rhythm, normal heart sounds and intact distal pulses.   No murmur heard.      Dorsalis pedis pulses faint posterior tibial pulses full  Pulmonary/Chest: Effort normal and breath sounds normal. She has no wheezes. She has no rales.  Abdominal: Soft. Bowel sounds are normal. She exhibits no distension and no mass. There is no tenderness. There is no rebound and no guarding.  Musculoskeletal: Normal range of motion. She exhibits no edema and no  tenderness.  Neurological: She is alert and oriented to person, place, and time. She has normal reflexes. No cranial nerve deficit. She exhibits normal muscle tone. Coordination normal.  Skin: Skin is warm and dry. No rash noted.  Psychiatric: She has a normal mood and affect. Her behavior is normal.          Assessment & Plan:   Preventive health examination History of gastroesophageal reflux disease stable Allergic rhinitis stable History of colonic polyps. Followup colonoscopy in 2 years  Gynecologic followup tomorrow. We'll consider taper and discontinuation of hormone replacement therapy

## 2011-09-20 NOTE — Patient Instructions (Signed)
It is important that you exercise regularly, at least 20 minutes 3 to 4 times per week.  If you develop chest pain or shortness of breath seek  medical attention.  Avoids foods high in acid such as tomatoes citrus juices, and spicy foods.  Avoid eating within two hours of lying down or before exercising.  Do not overheat.  Try smaller more frequent meals.  If symptoms persist, elevate the head of her bed 12 inches while sleeping.  Take a calcium supplement, plus [phone] units of vitamin D  Return in one year for follow-up

## 2011-09-21 LAB — HM PAP SMEAR: HM Pap smear: NORMAL

## 2011-11-03 ENCOUNTER — Telehealth: Payer: Self-pay | Admitting: Internal Medicine

## 2011-11-03 NOTE — Telephone Encounter (Signed)
Pt called req to get lab results from Oct CPX. Pls call. Pt req that these results be mailed to home address.

## 2011-11-03 NOTE — Telephone Encounter (Signed)
Mailed. KIK

## 2012-04-26 DIAGNOSIS — M545 Low back pain: Secondary | ICD-10-CM | POA: Diagnosis not present

## 2012-04-26 DIAGNOSIS — M76899 Other specified enthesopathies of unspecified lower limb, excluding foot: Secondary | ICD-10-CM | POA: Diagnosis not present

## 2012-05-24 DIAGNOSIS — M545 Low back pain: Secondary | ICD-10-CM | POA: Diagnosis not present

## 2012-05-29 DIAGNOSIS — M5430 Sciatica, unspecified side: Secondary | ICD-10-CM | POA: Diagnosis not present

## 2012-05-31 DIAGNOSIS — M5430 Sciatica, unspecified side: Secondary | ICD-10-CM | POA: Diagnosis not present

## 2012-06-02 DIAGNOSIS — M5430 Sciatica, unspecified side: Secondary | ICD-10-CM | POA: Diagnosis not present

## 2012-06-05 DIAGNOSIS — M5430 Sciatica, unspecified side: Secondary | ICD-10-CM | POA: Diagnosis not present

## 2012-06-07 DIAGNOSIS — M5430 Sciatica, unspecified side: Secondary | ICD-10-CM | POA: Diagnosis not present

## 2012-06-09 DIAGNOSIS — M5430 Sciatica, unspecified side: Secondary | ICD-10-CM | POA: Diagnosis not present

## 2012-06-12 DIAGNOSIS — M5430 Sciatica, unspecified side: Secondary | ICD-10-CM | POA: Diagnosis not present

## 2012-06-14 DIAGNOSIS — M5430 Sciatica, unspecified side: Secondary | ICD-10-CM | POA: Diagnosis not present

## 2012-06-16 DIAGNOSIS — M5430 Sciatica, unspecified side: Secondary | ICD-10-CM | POA: Diagnosis not present

## 2012-06-19 DIAGNOSIS — M5430 Sciatica, unspecified side: Secondary | ICD-10-CM | POA: Diagnosis not present

## 2012-06-22 DIAGNOSIS — M5430 Sciatica, unspecified side: Secondary | ICD-10-CM | POA: Diagnosis not present

## 2012-06-28 DIAGNOSIS — M5430 Sciatica, unspecified side: Secondary | ICD-10-CM | POA: Diagnosis not present

## 2012-06-30 DIAGNOSIS — M5430 Sciatica, unspecified side: Secondary | ICD-10-CM | POA: Diagnosis not present

## 2012-07-06 DIAGNOSIS — M5430 Sciatica, unspecified side: Secondary | ICD-10-CM | POA: Diagnosis not present

## 2012-07-07 DIAGNOSIS — M25559 Pain in unspecified hip: Secondary | ICD-10-CM | POA: Diagnosis not present

## 2012-09-12 DIAGNOSIS — Z1231 Encounter for screening mammogram for malignant neoplasm of breast: Secondary | ICD-10-CM | POA: Diagnosis not present

## 2012-09-25 ENCOUNTER — Encounter: Payer: Self-pay | Admitting: Internal Medicine

## 2012-09-25 ENCOUNTER — Ambulatory Visit (INDEPENDENT_AMBULATORY_CARE_PROVIDER_SITE_OTHER): Payer: Medicare Other | Admitting: Internal Medicine

## 2012-09-25 VITALS — BP 120/80 | HR 66 | Temp 98.2°F | Resp 18 | Ht 62.5 in | Wt 152.0 lb

## 2012-09-25 DIAGNOSIS — F3289 Other specified depressive episodes: Secondary | ICD-10-CM

## 2012-09-25 DIAGNOSIS — E785 Hyperlipidemia, unspecified: Secondary | ICD-10-CM | POA: Diagnosis not present

## 2012-09-25 DIAGNOSIS — J309 Allergic rhinitis, unspecified: Secondary | ICD-10-CM

## 2012-09-25 DIAGNOSIS — N951 Menopausal and female climacteric states: Secondary | ICD-10-CM | POA: Diagnosis not present

## 2012-09-25 DIAGNOSIS — Z23 Encounter for immunization: Secondary | ICD-10-CM | POA: Diagnosis not present

## 2012-09-25 DIAGNOSIS — F329 Major depressive disorder, single episode, unspecified: Secondary | ICD-10-CM

## 2012-09-25 DIAGNOSIS — Z0000 Encounter for general adult medical examination without abnormal findings: Secondary | ICD-10-CM

## 2012-09-25 DIAGNOSIS — Z860100 Personal history of colon polyps, unspecified: Secondary | ICD-10-CM

## 2012-09-25 LAB — CBC WITH DIFFERENTIAL/PLATELET
Eosinophils Relative: 4.8 % (ref 0.0–5.0)
HCT: 40.7 % (ref 36.0–46.0)
Hemoglobin: 13.3 g/dL (ref 12.0–15.0)
Lymphs Abs: 2.1 10*3/uL (ref 0.7–4.0)
MCV: 93.3 fl (ref 78.0–100.0)
Monocytes Absolute: 0.5 10*3/uL (ref 0.1–1.0)
Monocytes Relative: 6.8 % (ref 3.0–12.0)
Neutro Abs: 4.7 10*3/uL (ref 1.4–7.7)
Platelets: 264 10*3/uL (ref 150.0–400.0)
WBC: 7.8 10*3/uL (ref 4.5–10.5)

## 2012-09-25 LAB — POCT URINALYSIS DIPSTICK
Bilirubin, UA: NEGATIVE
Glucose, UA: NEGATIVE
Ketones, UA: NEGATIVE
Leukocytes, UA: NEGATIVE
Protein, UA: NEGATIVE
Spec Grav, UA: 1.01

## 2012-09-25 LAB — BASIC METABOLIC PANEL
BUN: 12 mg/dL (ref 6–23)
CO2: 30 mEq/L (ref 19–32)
Calcium: 9.5 mg/dL (ref 8.4–10.5)
Creatinine, Ser: 0.7 mg/dL (ref 0.4–1.2)
GFR: 85.79 mL/min (ref >60.00)
Glucose, Bld: 84 mg/dL (ref 70–99)

## 2012-09-25 LAB — HEPATIC FUNCTION PANEL
AST: 18 U/L (ref 0–37)
Bilirubin, Direct: 0 mg/dL (ref 0.0–0.3)
Total Bilirubin: 0.5 mg/dL (ref 0.3–1.2)

## 2012-09-25 LAB — TSH: TSH: 2.86 u[IU]/mL (ref 0.35–5.50)

## 2012-09-25 MED ORDER — FLUTICASONE PROPIONATE 50 MCG/ACT NA SUSP: 1.0000 | Freq: Every day | NASAL | Status: DC

## 2012-09-25 MED ORDER — PANTOPRAZOLE SODIUM 40 MG PO TBEC: 40.0000 mg | DELAYED_RELEASE_TABLET | Freq: Every day | ORAL | Status: DC

## 2012-09-25 MED ORDER — TOLTERODINE TARTRATE ER 4 MG PO CP24: 4.0000 mg | ORAL_CAPSULE | Freq: Every day | ORAL | Status: DC

## 2012-09-25 MED ORDER — ESTRADIOL 0.05 MG/24HR TD PTTW: 1.0000 | MEDICATED_PATCH | TRANSDERMAL | Status: DC

## 2012-09-25 MED ORDER — ALPRAZOLAM 0.5 MG PO TABS: 0.5000 mg | ORAL_TABLET | Freq: Every day | ORAL | Status: DC

## 2012-09-25 MED ORDER — MELOXICAM 15 MG PO TABS: 15.0000 mg | ORAL_TABLET | Freq: Every day | ORAL | Status: DC

## 2012-09-25 NOTE — Addendum Note (Signed)
Addended by: Rita Ohara R on: 09/25/2012 01:52 PM   Modules accepted: Orders

## 2012-09-25 NOTE — Progress Notes (Signed)
Patient ID: Victoria Bryant, female   DOB: [birth year], 66 y.o.   MRN: [id number]  Subjective:    Patient ID: Victoria Bryant, female    DOB: 17-Dec-1945, 66 y.o.   MRN: [id number]  HPI  34 -year-old patient who is seen today for a preventative health examination. She enjoys excellent health.  She does have a history of hammertoe deformity and did have surgery since her last annual exam. She has a history of anxiety depression and some chronic neck pain. She has a history of colonic polyps and allergic rhinitis she has remote history of a malignant melanoma. She had a mammogram last week and is scheduled for gynecologic followup soon  Here for Medicare AWV:   1. Risk factors based on Past M, S, F history: no significant cardiovascular risk factors.  2. Physical Activities: enjoys a 3-minute exercise regimen, almost every day  3. Depression/mood: history depression, which is now clinically stable has required treatment in the past. Presently takes alprazolam p.r.n. anxiety  4. Hearing: no impairment  5. ADL's: independent in all aspects of daily living  6. Fall Risk: low  7. Home Safety: No problems  identified  8. Height, weight, &visual acuity:height and weight stable. No problems with visual acuity  9. Counseling: regular exercise, calcium supplementation with vitamin D. All encouraged  10. Labs ordered based on risk factors: laboratory profile, including lipid panel will be reviewed  11. Referral Coordination- follow-up GYN as scheduled  12. Care Plan- colonoscopy due in 2 years  13. Cognitive Assessment- alert and oriented normal affect. No memory disturbance. No difficulty handling all executive functions   Allergies:  1) ! Biaxin  2) Amoxicillin (Amoxicillin)  3) Erythromycin Ethylsuccinate (Erythromycin Ethylsuccinate)  4) Paxil (Paroxetine Hcl)  5) Periostat (Doxycycline Hyclate)   Past History:  Past Medical History:  Reviewed history from 05/26/2009 and no changes required.   Allergic rhinitis  Colonic polyps, hx of  GERD  Palpitations  Depression   Past Surgical History:  Hysterectomy 1976  Tonsillectomy  colonoscopy and upper gastroduodenoscopy 2009,  EGD 2011  melanoma resection, left arm, 1991  history rectal abscess  Surgery for hammertoe deformity  Family History:  Reviewed history from 05/26/2009 and no changes required.  details of her father's health unknown  mother died age 68 from bladder cancer; also status post resection for lung cancer  One brother: history of hypertension  one sister. History hypertension, obesity, and dyslipidemia, CNS hemorrhage  Aunt: breast cancer, cerebral hemorrhage  Social History:  Reviewed history from 04/08/2008 and no changes required.  Married  discontinued tobacco 20 years ago  retired May 2009    Past Medical History  Diagnosis Date  . COLONIC POLYPS, HX OF 04/21/2007  . DEPRESSION 05/26/2009  . GERD 04/08/2008  . 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 MENOPAUSAL SYNDROME 04/21/2007  . NECK PAIN, CHRONIC 04/21/2007   Past Surgical History  Procedure Date  . Abdominal hysterectomy   . Tonsillectomy   . Incision and drainage abscess anal     reports that she quit smoking about 38 years ago. She has never used smokeless tobacco. She reports that she does not drink alcohol or use illicit drugs. family history is not on file. Allergies  Allergen Reactions  . Amoxicillin     REACTION: unspecified  . Clarithromycin   . Doxycycline Hyclate     REACTION: unspecified  . Erythromycin Ethylsuccinate     REACTION: unspecified  . Paroxetine     REACTION: unspecified  Review of Systems  Constitutional: Negative for fever, appetite change, fatigue and unexpected weight change.  HENT: Negative for hearing loss, ear pain, nosebleeds, congestion, sore throat, mouth sores, trouble swallowing, neck stiffness, dental problem, voice change, sinus pressure and tinnitus.   Eyes: Negative for  photophobia, pain, redness and visual disturbance.  Respiratory: Negative for cough, chest tightness and shortness of breath.   Cardiovascular: Negative for chest pain, palpitations and leg swelling.  Gastrointestinal: Negative for nausea, vomiting, abdominal pain, diarrhea, constipation, blood in stool, abdominal distention and rectal pain.  Genitourinary: Negative for dysuria, urgency, frequency, hematuria, flank pain, vaginal bleeding, vaginal discharge, difficulty urinating, genital sores, vaginal pain, menstrual problem and pelvic pain.  Musculoskeletal: Negative for back pain and arthralgias.       Left second toe pain recent onset  Skin: Negative for rash.  Neurological: Negative for dizziness, syncope, speech difficulty, weakness, light-headedness, numbness and headaches.  Hematological: Negative for adenopathy. Does not bruise/bleed easily.  Psychiatric/Behavioral: Negative for suicidal ideas, behavioral problems, self-injury, dysphoric mood and agitation. The patient is not nervous/anxious.        Objective:   Physical Exam  Constitutional: She is oriented to person, place, and time. She appears well-developed and well-nourished.  HENT:  Head: Normocephalic and atraumatic.  Right Ear: External ear normal.  Left Ear: External ear normal.  Mouth/Throat: Oropharynx is clear and moist.  Eyes: Conjunctivae normal and EOM are normal.  Neck: Normal range of motion. Neck supple. No JVD present. No thyromegaly present.  Cardiovascular: Normal rate, regular rhythm, normal heart sounds and intact distal pulses.   No murmur heard.      Dorsalis pedis pulses faint posterior tibial pulses full  Pulmonary/Chest: Effort normal and breath sounds normal. She has no wheezes. She has no rales.  Abdominal: Soft. Bowel sounds are normal. She exhibits no distension and no mass. There is no tenderness. There is no rebound and no guarding.  Musculoskeletal: Normal range of motion. She exhibits no edema  and no tenderness.  Neurological: She is alert and oriented to person, place, and time. She has normal reflexes. No cranial nerve deficit. She exhibits normal muscle tone. Coordination normal.  Skin: Skin is warm and dry. No rash noted.  Psychiatric: She has a normal mood and affect. Her behavior is normal.          Assessment & Plan:   Preventive health examination History of gastroesophageal reflux disease stable Allergic rhinitis stable History of colonic polyps. Followup colonoscopy in 2 years  Gynecologic followup . We'll consider taper and discontinuation of hormone replacement therapy

## 2012-09-25 NOTE — Patient Instructions (Signed)
Limit your sodium (Salt) intake    It is important that you exercise regularly, at least 20 minutes 3 to 4 times per week.  If you develop chest pain or shortness of breath seek  medical attention.  Take a calcium supplement, plus 800-1200 units of vitamin D  Return in one year for follow-up  

## 2012-09-26 ENCOUNTER — Encounter: Payer: Self-pay | Admitting: Internal Medicine

## 2012-09-27 ENCOUNTER — Encounter: Payer: Self-pay | Admitting: Internal Medicine

## 2012-10-05 ENCOUNTER — Telehealth: Payer: Self-pay

## 2012-10-05 MED ORDER — ESTRADIOL 0.05 MG/24HR TD PTTW: 1.0000 | MEDICATED_PATCH | TRANSDERMAL | Status: DC

## 2012-10-05 MED ORDER — MELOXICAM 15 MG PO TABS: 15.0000 mg | ORAL_TABLET | Freq: Every day | ORAL | Status: DC

## 2012-10-05 NOTE — Telephone Encounter (Signed)
Please change prescription to twice weekly

## 2012-10-05 NOTE — Telephone Encounter (Signed)
Faxed to meds by mail.

## 2012-10-05 NOTE — Telephone Encounter (Signed)
rev'd fax from mail order - "[facility] - [place] , [place]"  Estradiol Patch -clarification - rx'd for 1 patch qwk - they atate it is usually rx'd for change twice weekly Please advise  Also pt requesting 90 rx for mobic

## 2012-10-27 ENCOUNTER — Encounter: Payer: Medicare Other | Admitting: Internal Medicine

## 2013-02-07 DIAGNOSIS — J309 Allergic rhinitis, unspecified: Secondary | ICD-10-CM | POA: Diagnosis not present

## 2013-04-16 DIAGNOSIS — Z86010 Personal history of colonic polyps: Secondary | ICD-10-CM | POA: Diagnosis not present

## 2013-04-16 DIAGNOSIS — K2270 Barrett's esophagus without dysplasia: Secondary | ICD-10-CM | POA: Diagnosis not present

## 2013-04-19 ENCOUNTER — Encounter: Payer: Self-pay | Admitting: Internal Medicine

## 2013-04-19 DIAGNOSIS — Z86010 Personal history of colonic polyps: Secondary | ICD-10-CM | POA: Diagnosis not present

## 2013-04-19 DIAGNOSIS — K228 Other specified diseases of esophagus: Secondary | ICD-10-CM | POA: Diagnosis not present

## 2013-04-19 DIAGNOSIS — K5730 Diverticulosis of large intestine without perforation or abscess without bleeding: Secondary | ICD-10-CM | POA: Diagnosis not present

## 2013-04-19 DIAGNOSIS — Z09 Encounter for follow-up examination after completed treatment for conditions other than malignant neoplasm: Secondary | ICD-10-CM | POA: Diagnosis not present

## 2013-09-11 DIAGNOSIS — N899 Noninflammatory disorder of vagina, unspecified: Secondary | ICD-10-CM | POA: Diagnosis not present

## 2013-09-11 DIAGNOSIS — N951 Menopausal and female climacteric states: Secondary | ICD-10-CM | POA: Diagnosis not present

## 2013-09-13 DIAGNOSIS — Z1231 Encounter for screening mammogram for malignant neoplasm of breast: Secondary | ICD-10-CM | POA: Diagnosis not present

## 2013-09-13 DIAGNOSIS — Z780 Asymptomatic menopausal state: Secondary | ICD-10-CM | POA: Diagnosis not present

## 2013-09-26 ENCOUNTER — Ambulatory Visit (INDEPENDENT_AMBULATORY_CARE_PROVIDER_SITE_OTHER): Payer: Medicare Other | Admitting: Internal Medicine

## 2013-09-26 ENCOUNTER — Encounter: Payer: Self-pay | Admitting: Internal Medicine

## 2013-09-26 VITALS — BP 122/80 | HR 66 | Temp 98.1°F | Resp 20 | Ht 61.5 in | Wt 154.0 lb

## 2013-09-26 DIAGNOSIS — E559 Vitamin D deficiency, unspecified: Secondary | ICD-10-CM

## 2013-09-26 DIAGNOSIS — Z0000 Encounter for general adult medical examination without abnormal findings: Secondary | ICD-10-CM | POA: Diagnosis not present

## 2013-09-26 DIAGNOSIS — Z23 Encounter for immunization: Secondary | ICD-10-CM | POA: Diagnosis not present

## 2013-09-26 DIAGNOSIS — E785 Hyperlipidemia, unspecified: Secondary | ICD-10-CM | POA: Diagnosis not present

## 2013-09-26 DIAGNOSIS — J309 Allergic rhinitis, unspecified: Secondary | ICD-10-CM | POA: Diagnosis not present

## 2013-09-26 DIAGNOSIS — K219 Gastro-esophageal reflux disease without esophagitis: Secondary | ICD-10-CM

## 2013-09-26 DIAGNOSIS — Z86010 Personal history of colonic polyps: Secondary | ICD-10-CM

## 2013-09-26 DIAGNOSIS — I499 Cardiac arrhythmia, unspecified: Secondary | ICD-10-CM | POA: Diagnosis not present

## 2013-09-26 LAB — CBC WITH DIFFERENTIAL/PLATELET
Basophils Absolute: 0 10*3/uL (ref 0.0–0.1)
Eosinophils Absolute: 0.3 10*3/uL (ref 0.0–0.7)
HCT: 38.3 % (ref 36.0–46.0)
Hemoglobin: 13 g/dL (ref 12.0–15.0)
Lymphocytes Relative: 31.2 % (ref 12.0–46.0)
Lymphs Abs: 2 10*3/uL (ref 0.7–4.0)
MCHC: 34 g/dL (ref 30.0–36.0)
Monocytes Relative: 7.1 % (ref 3.0–12.0)
Neutro Abs: 3.7 10*3/uL (ref 1.4–7.7)
Neutrophils Relative %: 56.5 % (ref 43.0–77.0)
Platelets: 267 10*3/uL (ref 150.0–400.0)
RDW: 12.6 % (ref 11.5–14.6)

## 2013-09-26 LAB — LIPID PANEL
Cholesterol: 237 mg/dL — ABNORMAL HIGH (ref 0–200)
HDL: 56.7 mg/dL (ref >39.00)
VLDL: 24.6 mg/dL (ref 0.0–40.0)

## 2013-09-26 LAB — COMPREHENSIVE METABOLIC PANEL
AST: 19 U/L (ref 0–37)
Alkaline Phosphatase: 74 U/L (ref 39–117)
BUN: 11 mg/dL (ref 6–23)
Creatinine, Ser: 0.6 mg/dL (ref 0.4–1.2)
Total Bilirubin: 0.6 mg/dL (ref 0.3–1.2)

## 2013-09-26 LAB — TSH: TSH: 2.75 u[IU]/mL (ref 0.35–5.50)

## 2013-09-26 LAB — LDL CHOLESTEROL, DIRECT: Direct LDL: 164.2 mg/dL

## 2013-09-26 MED ORDER — PANTOPRAZOLE SODIUM 40 MG PO TBEC: 40.0000 mg | DELAYED_RELEASE_TABLET | Freq: Every day | ORAL | Status: DC

## 2013-09-26 MED ORDER — ESTRADIOL 0.05 MG/24HR TD PTTW: 1.0000 | MEDICATED_PATCH | TRANSDERMAL | Status: DC

## 2013-09-26 MED ORDER — TOLTERODINE TARTRATE ER 4 MG PO CP24: 4.0000 mg | ORAL_CAPSULE | Freq: Every day | ORAL | Status: DC

## 2013-09-26 MED ORDER — FLUTICASONE PROPIONATE 50 MCG/ACT NA SUSP: 1.0000 | Freq: Every day | NASAL | Status: DC

## 2013-09-26 MED ORDER — MELOXICAM 15 MG PO TABS: 15.0000 mg | ORAL_TABLET | Freq: Every day | ORAL | Status: DC

## 2013-09-26 MED ORDER — ALPRAZOLAM 0.5 MG PO TABS: 0.5000 mg | ORAL_TABLET | Freq: Every day | ORAL | Status: DC

## 2013-09-26 NOTE — Progress Notes (Signed)
Patient ID: Victoria Bryant, female   DOB: Feb 21, 1946, 67 y.o.   MRN: [id number] Patient ID: Victoria Bryant, female   DOB: Mar 01, 1946, 67 y.o.   MRN: [id number]  Subjective:    Patient ID: Victoria Bryant, female    DOB: December 14, 1945, 67 y.o.   MRN: [id number]  HPI  74 -year-old patient who is seen today for a preventative health examination. She enjoys excellent health.  She does have a history of hammertoe deformity and did have surgery since her last annual exam. She has a history of anxiety depression and some chronic neck pain. She has a history of colonic polyps and allergic rhinitis she has remote history of a malignant melanoma. She had a mammogram last week and is scheduled for gynecologic followup soon  Here for Medicare AWV:   1. Risk factors based on Past M, S, F history: no significant cardiovascular risk factors.  2. Physical Activities: enjoys a 3-minute exercise regimen, almost every day  3. Depression/mood: history depression, which is now clinically stable has required treatment in the past. Presently takes alprazolam p.r.n. anxiety  4. Hearing: no impairment  5. ADL's: independent in all aspects of daily living  6. Fall Risk: low  7. Home Safety: No problems  identified  8. Height, weight, &visual acuity:height and weight stable. No problems with visual acuity  9. Counseling: regular exercise, calcium supplementation with vitamin D. All encouraged  10. Labs ordered based on risk factors: laboratory profile, including lipid panel will be reviewed  11. Referral Coordination- follow-up GYN as scheduled  12. Care Plan- colonoscopy due in 2 years  13. Cognitive Assessment- alert and oriented normal affect. No memory disturbance. No difficulty handling all executive functions   Allergies:  1) ! Biaxin  2) Amoxicillin (Amoxicillin)  3) Erythromycin Ethylsuccinate (Erythromycin Ethylsuccinate)  4) Paxil (Paroxetine Hcl)  5) Periostat (Doxycycline Hyclate)   Past History:   Past Medical History:  Reviewed history from 05/26/2009 and no changes required.  Allergic rhinitis  Colonic polyps, hx of  GERD  Palpitations  Depression   Past Surgical History:  Hysterectomy 1976  Tonsillectomy  colonoscopy and upper gastroduodenoscopy 2009, 2014 EGD 2011  melanoma resection, left arm, 1991  history rectal abscess  Surgery for hammertoe deformity  Family History:  Reviewed history from 05/26/2009 and no changes required.  details of her father's health unknown  mother died age 61 from bladder cancer; also status post resection for lung cancer  One brother: history of hypertension  one sister. History hypertension, obesity, and dyslipidemia, CNS hemorrhage  Aunt: breast cancer, cerebral hemorrhage  Social History:  Reviewed history from 04/08/2008 and no changes required.  Married  discontinued tobacco 20 years ago  retired May 2009    Past Medical History  Diagnosis Date  . COLONIC POLYPS, HX OF 04/21/2007  . DEPRESSION 05/26/2009  . GERD 04/08/2008  . 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 MENOPAUSAL SYNDROME 04/21/2007  . NECK PAIN, CHRONIC 04/21/2007   Past Surgical History  Procedure Laterality Date  . Abdominal hysterectomy    . Tonsillectomy    . Incision and drainage abscess anal      reports that she quit smoking about 39 years ago. She has never used smokeless tobacco. She reports that she does not drink alcohol or use illicit drugs. family history is not on file. Allergies  Allergen Reactions  . Clarithromycin   . Doxycycline Hyclate     REACTION: unspecified  . Erythromycin Ethylsuccinate  REACTION: unspecified  . Paroxetine     REACTION: unspecified     Review of Systems  Constitutional: Negative for fever, appetite change, fatigue and unexpected weight change.  HENT: Negative for congestion, dental problem, ear pain, hearing loss, mouth sores, nosebleeds, sinus pressure, sore throat, tinnitus, trouble swallowing and  voice change.   Eyes: Negative for photophobia, pain, redness and visual disturbance.  Respiratory: Negative for cough, chest tightness and shortness of breath.   Cardiovascular: Negative for chest pain, palpitations and leg swelling.  Gastrointestinal: Negative for nausea, vomiting, abdominal pain, diarrhea, constipation, blood in stool, abdominal distention and rectal pain.  Genitourinary: Negative for dysuria, urgency, frequency, hematuria, flank pain, vaginal bleeding, vaginal discharge, difficulty urinating, genital sores, vaginal pain, menstrual problem and pelvic pain.  Musculoskeletal: Negative for arthralgias, back pain and neck stiffness.       Left second toe pain recent onset  Skin: Negative for rash.  Neurological: Negative for dizziness, syncope, speech difficulty, weakness, light-headedness, numbness and headaches.  Hematological: Negative for adenopathy. Does not bruise/bleed easily.  Psychiatric/Behavioral: Negative for suicidal ideas, behavioral problems, self-injury, dysphoric mood and agitation. The patient is not nervous/anxious.        Objective:   Physical Exam  Constitutional: She is oriented to person, place, and time. She appears well-developed and well-nourished.  HENT:  Head: Normocephalic and atraumatic.  Right Ear: External ear normal.  Left Ear: External ear normal.  Mouth/Throat: Oropharynx is clear and moist.  Eyes: Conjunctivae and EOM are normal.  Neck: Normal range of motion. Neck supple. No JVD present. No thyromegaly present.  Cardiovascular: Normal rate, regular rhythm, normal heart sounds and intact distal pulses.   No murmur heard. Dorsalis pedis pulses faint ; decreased right posterior tibial pulse  Pulmonary/Chest: Effort normal and breath sounds normal. She has no wheezes. She has no rales.  Abdominal: Soft. Bowel sounds are normal. She exhibits no distension and no mass. There is no tenderness. There is no rebound and no guarding.   Musculoskeletal: Normal range of motion. She exhibits no edema and no tenderness.  Neurological: She is alert and oriented to person, place, and time. She has normal reflexes. No cranial nerve deficit. She exhibits normal muscle tone. Coordination normal.  Skin: Skin is warm and dry. No rash noted.  Psychiatric: She has a normal mood and affect. Her behavior is normal.          Assessment & Plan:   Preventive health examination History of gastroesophageal reflux disease stable Allergic rhinitis stable History of colonic polyps. Followup colonoscopy in 5 years  Gynecologic followup . We'll consider taper and discontinuation of hormone replacement therapy

## 2013-09-26 NOTE — Patient Instructions (Signed)
Limit your sodium (Salt) intake    It is important that you exercise regularly, at least 20 minutes 3 to 4 times per week.  If you develop chest pain or shortness of breath seek  medical attention.  You need to lose weight.  Consider a lower calorie diet and regular exercise.  Take a calcium supplement, plus 800-1200 units of vitamin D  Return in one year for follow-up  

## 2013-09-27 LAB — VITAMIN D 25 HYDROXY (VIT D DEFICIENCY, FRACTURES): Vit D, 25-Hydroxy: 45 ng/mL (ref 30–89)

## 2013-10-08 ENCOUNTER — Telehealth: Payer: Self-pay | Admitting: Internal Medicine

## 2013-10-08 NOTE — Telephone Encounter (Signed)
Spoke to pt told her lab results were normal except Cholesterol was elevated at 237. Pt verbalized understanding. Asked pt if she would like a copy? Pt stated yes. Told her I will mail copy to her. Pt verbalized understanding.

## 2013-10-08 NOTE — Telephone Encounter (Signed)
Pt would like blood work results °

## 2014-04-10 ENCOUNTER — Telehealth: Payer: Self-pay | Admitting: Internal Medicine

## 2014-04-10 NOTE — Telephone Encounter (Signed)
Patient Information:  Caller Name: Victoria Bryant  Phone: [phone]  Patient: Victoria Bryant, Victoria Bryant  Gender: Female  DOB: September 19, 1946  Age: 68 Years  PCP: Bluford Kaufmann (Family Practice > 17yrs old)  Office Follow Up:  Does the office need to follow up with this patient?: No  Instructions For The Office: N/A  RN Note:  Pulse 76 and regular at time of call.  BP 143/81 left arm at 1130.  Suggested to keep a diary of event frequency to show MD at office visit.  Requests appointment for 03/12/14 to see Dr Burnice Logan and because repair man is coming to home this afternoon.    Symptoms  Reason For Call & Symptoms: Heart palpitations or fluttering waking her at night.  Episodes are intermittent lasting 3-4 seconds but occuring multiple times during day.  Noted more fluttering when sitting or lying down.  Reviewed Health History In EMR: Yes  Reviewed Medications In EMR: Yes  Reviewed Allergies In EMR: Yes  Reviewed Surgeries / Procedures: Yes  Date of Onset of Symptoms: 03/13/2014  Treatments Tried: walking  Treatments Tried Worked: No  Guideline(s) Used:  Heart Rate and Heartbeat Questions  Disposition Per Guideline:   See Today in Office  Reason For Disposition Reached:   Age > 60 years  Advice Given:  Reassurance  Everybody has palpitations at some point in their lives. Sometimes it is simply a heightened awareness of the heart's normal beating.  Occasional extra heart beats are experienced by most everyone. Lack of sleep, stress, and caffeinated beverages can make palpitations worse.  Patients with anxiety or stress may describe a "rapid heartbeat" or "pounding" in their chest from their heart beating.  Health Basics  Sleep: Try to get sufficient amount of sleep. Lack of sleep can aggravate palpitations. Most people need 7-8 hours of sleep each night.  Exercise: Regular exercise will improve your overall health, improve your mood, and is a simple method to reduce stress.  Diet:  Eat a balanced healthy diet.  Liquid Intake: Drink adequate liquids, 6-8 glasses of water daily.  Call Back If:  Chest pain, lightheadedness, or difficulty breathing occurs  Heart beating more than 130 beats / minute  More than 3 extra or skipped beats / minute  You become worse.  Patient Will Follow Care Advice:  YES  Appointment Scheduled:  04/11/2014 10:30:00 Appointment Scheduled Provider:  Bluford Kaufmann (Family Practice > 55yrs old)

## 2014-04-10 NOTE — Telephone Encounter (Signed)
Noted  

## 2014-04-11 ENCOUNTER — Ambulatory Visit (INDEPENDENT_AMBULATORY_CARE_PROVIDER_SITE_OTHER): Payer: Medicare Other | Admitting: Internal Medicine

## 2014-04-11 ENCOUNTER — Ambulatory Visit: Payer: Self-pay | Admitting: Internal Medicine

## 2014-04-11 ENCOUNTER — Encounter: Payer: Self-pay | Admitting: Internal Medicine

## 2014-04-11 VITALS — BP 126/78 | HR 74 | Temp 98.2°F | Resp 20 | Ht 61.5 in | Wt 155.0 lb

## 2014-04-11 DIAGNOSIS — R002 Palpitations: Secondary | ICD-10-CM | POA: Diagnosis not present

## 2014-04-11 DIAGNOSIS — J309 Allergic rhinitis, unspecified: Secondary | ICD-10-CM | POA: Diagnosis not present

## 2014-04-11 DIAGNOSIS — F411 Generalized anxiety disorder: Secondary | ICD-10-CM | POA: Insufficient documentation

## 2014-04-11 MED ORDER — HYOSCYAMINE SULFATE 0.125 MG SL SUBL: 0.1250 mg | SUBLINGUAL_TABLET | Freq: Four times a day (QID) | SUBLINGUAL | Status: DC | PRN

## 2014-04-11 NOTE — Progress Notes (Signed)
Pre-visit discussion using our clinic review tool. No additional management support is needed unless otherwise documented below in the visit note.  

## 2014-04-11 NOTE — Progress Notes (Signed)
Subjective:    Patient ID: Victoria Bryant, female    DOB: 28-Aug-1946, 68 y.o.   MRN: [id number]  HPI   68 year old patient, who presents today with a chief complaint of palpitations.  These have been intermittent over the past 3 weeks.  She describes a brief fluttering of the heart that is nonsustained.  Symptoms seem to occur more at rest and relieved by exercise.  She states she has had these in the remote past, but have reoccurred over the past 3-4 weeks.  EKG performed today and was normal.  Denies any exertional chest pain. She does complain of increasing anxiety.  She does have a history of anxiety disorder and does use alprazolam when necessary.  She also complains of some abdominal fullness.  Has used Levsin for mild IBS.  Also in the past. She does have a history of allergic rhinitis, but has not used any decongestants.  Her caffeine load is very modest  Past Medical History  Diagnosis Date  . COLONIC POLYPS, HX OF 04/21/2007  . DEPRESSION 05/26/2009  . GERD 04/08/2008  . 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 MENOPAUSAL SYNDROME 04/21/2007  . NECK PAIN, CHRONIC 04/21/2007    History   Social History  . Marital Status: Married    Spouse Name: N/A    Number of Children: N/A  . Years of Education: N/A   Occupational History  . Not on file.   Social History Main Topics  . Smoking status: Former Smoker    Quit date: 12/06/1973  . Smokeless tobacco: Never Used  . Alcohol Use: No  . Drug Use: No  . Sexual Activity: Not on file   Other Topics Concern  . Not on file   Social History Narrative  . No narrative on file    Past Surgical History  Procedure Laterality Date  . Abdominal hysterectomy    . Tonsillectomy    . Incision and drainage abscess anal      No family history on file.  Allergies  Allergen Reactions  . Clarithromycin   . Doxycycline Hyclate     REACTION: unspecified  . Erythromycin Ethylsuccinate     REACTION: unspecified  . Paroxetine       REACTION: unspecified    Current Outpatient Prescriptions on File Prior to Visit  Medication Sig Dispense Refill  . ALPRAZolam (XANAX) 0.5 MG tablet Take 1 tablet (0.5 mg total) by mouth daily.  30 tablet  5  . estradiol (VIVELLE-DOT) 0.05 MG/24HR patch Place 1 patch (0.05 mg total) onto the skin 2 (two) times a week.  8 patch  5  . fluticasone (FLONASE) 50 MCG/ACT nasal spray Place 1 spray into the nose daily.  16 g  5  . meloxicam (MOBIC) 15 MG tablet Take 1 tablet (15 mg total) by mouth daily.  90 tablet  3  . pantoprazole (PROTONIX) 40 MG tablet Take 1 tablet (40 mg total) by mouth daily.  90 tablet  3  . [DISCONTINUED] tolterodine (DETROL LA) 4 MG 24 hr capsule Take 1 capsule (4 mg total) by mouth daily.  90 capsule  6   No current facility-administered medications on file prior to visit.    BP 126/78  Pulse 74  Temp(Src) 98.2 F (36.8 C) (Oral)  Resp 20  Ht 5' 1.5" (1.562 m)  Wt 155 lb (70.308 kg)  BMI 28.82 kg/m2  SpO2 96%       Review of Systems  Constitutional: Negative.   HENT: Negative  for congestion, dental problem, hearing loss, rhinorrhea, sinus pressure, sore throat and tinnitus.   Eyes: Negative for pain, discharge and visual disturbance.  Respiratory: Negative for cough and shortness of breath.   Cardiovascular: Positive for palpitations. Negative for chest pain and leg swelling.  Gastrointestinal: Negative for nausea, vomiting, abdominal pain, diarrhea, constipation, blood in stool and abdominal distention.  Genitourinary: Negative for dysuria, urgency, frequency, hematuria, flank pain, vaginal bleeding, vaginal discharge, difficulty urinating, vaginal pain and pelvic pain.  Musculoskeletal: Negative for arthralgias, gait problem and joint swelling.  Skin: Negative for rash.  Neurological: Negative for dizziness, syncope, speech difficulty, weakness, numbness and headaches.  Hematological: Negative for adenopathy.  Psychiatric/Behavioral: Negative for  behavioral problems, dysphoric mood and agitation. The patient is nervous/anxious.        Objective:   Physical Exam  Constitutional: She is oriented to person, place, and time. She appears well-developed and well-nourished.  HENT:  Head: Normocephalic.  Right Ear: External ear normal.  Left Ear: External ear normal.  Mouth/Throat: Oropharynx is clear and moist.  Eyes: Conjunctivae and EOM are normal. Pupils are equal, round, and reactive to light.  Neck: Normal range of motion. Neck supple. No thyromegaly present.  Cardiovascular: Normal rate, regular rhythm, normal heart sounds and intact distal pulses.   Pulmonary/Chest: Effort normal and breath sounds normal.  Abdominal: Soft. Bowel sounds are normal. She exhibits no mass. There is no tenderness.  Musculoskeletal: Normal range of motion.  Lymphadenopathy:    She has no cervical adenopathy.  Neurological: She is alert and oriented to person, place, and time.  Skin: Skin is warm and dry. No rash noted.  Psychiatric: She has a normal mood and affect. Her behavior is normal.          Assessment & Plan:   Palpitations.  Patient has a normal cardiac exam and normal EKG.  Patient reassured Mild anxiety disorder Nonspecific abdominal pain.  Patient requests a refill of levsin   Will return if symptoms intensify or fail to improve

## 2014-04-11 NOTE — Patient Instructions (Signed)
Palpitations   A palpitation is the feeling that your heartbeat is irregular or is faster than normal. It may feel like your heart is fluttering or skipping a beat. Palpitations are usually not a serious problem. However, in some cases, you may need further medical evaluation.  CAUSES   Palpitations can be caused by:   Smoking.   Caffeine or other stimulants, such as diet pills or energy drinks.   Alcohol.   Stress and anxiety.   Strenuous physical activity.   Fatigue.   Certain medicines.   Heart disease, especially if you have a history of arrhythmias. This includes atrial fibrillation, atrial flutter, or supraventricular tachycardia.   An improperly working pacemaker or defibrillator.  DIAGNOSIS   To find the cause of your palpitations, your caregiver will take your history and perform a physical exam. Tests may also be done, including:   Electrocardiography (ECG). This test records the heart's electrical activity.   Cardiac monitoring. This allows your caregiver to monitor your heart rate and rhythm in real time.   Holter monitor. This is a portable device that records your heartbeat and can help diagnose heart arrhythmias. It allows your caregiver to track your heart activity for several days, if needed.   Stress tests by exercise or by giving medicine that makes the heart beat faster.  TREATMENT   Treatment of palpitations depends on the cause of your symptoms and can vary greatly. Most cases of palpitations do not require any treatment other than time, relaxation, and monitoring your symptoms. Other causes, such as atrial fibrillation, atrial flutter, or supraventricular tachycardia, usually require further treatment.  HOME CARE INSTRUCTIONS    Avoid:   Caffeinated coffee, tea, soft drinks, diet pills, and energy drinks.   Chocolate.   Alcohol.   Stop smoking if you smoke.   Reduce your stress and anxiety. Things that can help you relax include:   A method that measures bodily functions so  you can learn to control them (biofeedback).   Yoga.   Meditation.   Physical activity such as swimming, jogging, or walking.   Get plenty of rest and sleep.  SEEK MEDICAL CARE IF:    You continue to have a fast or irregular heartbeat beyond 24 hours.   Your palpitations occur more often.  SEEK IMMEDIATE MEDICAL CARE IF:   You develop chest pain or shortness of breath.   You have a severe headache.   You feel dizzy, or you faint.  MAKE SURE YOU:   Understand these instructions.   Will watch your condition.   Will get help right away if you are not doing well or get worse.  Document Released: 11/19/2000 Document Revised: 03/19/2013 Document Reviewed: 01/21/2012  Exit[employer] Patient Information 2014 [employer]

## 2014-05-07 ENCOUNTER — Telehealth: Payer: Self-pay | Admitting: Internal Medicine

## 2014-05-07 NOTE — Telephone Encounter (Signed)
Patient Information:  Caller Name: Chanah  Phone: [phone]  Patient: Victoria Bryant, Victoria Bryant  Gender: Female  DOB: [birth year]  Age: 68 Years  PCP: Bluford Kaufmann (Family Practice > 73yrs old)  Office Follow Up:  Does the office need to follow up with this patient?: No  Instructions For The Office: N/A   Symptoms  Reason For Call & Symptoms: Calling about started with headache, congestion, thick drainage in throat with yellow sputum on 05/02/14. She started with cough on 05/05/14 and fever on 05/06/14. She is needing to sleep with head elevated. She is having frequent urination and accidents. Neck muscles hurt and she feels wheezy and is starting with all over body aches and fatigue.  Reviewed Health History In EMR: Yes  Reviewed Medications In EMR: Yes  Reviewed Allergies In EMR: Yes  Reviewed Surgeries / Procedures: Yes  Date of Onset of Symptoms: 05/30/2014  Treatments Tried: Tylenol 250mg  1 tab PO - did not help with headache and eye pressure.  Treatments Tried Worked: Yes  Guideline(s) Used:  Sinus Pain and Congestion  Urination Pain - Female  Disposition Per Guideline:   Go to Office Now  Reason For Disposition Reached:   Fever > 100.5 F (38.1 C)  Advice Given:  For a Runny Nose With Profuse Discharge:  Nasal mucus and discharge helps to wash viruses and bacteria out of the nose and sinuses.  Blowing the nose is all that is needed.  Medicines for a Stuffy or Runny Nose:  Most cold medicines that are available over-the-counter (OTC) are not helpful.  Pain and Fever Medicines:  Acetaminophen (e.g., Tylenol):  Regular Strength Tylenol: Take 650 mg (two 325 mg pills) by mouth every 4-6 hours as needed. Each Regular Strength Tylenol pill has 325 mg of acetaminophen.  Extra Strength Tylenol: Take 1,000 mg (two 500 mg pills) every 8 hours as needed. Each Extra Strength Tylenol pill has 500 mg of acetaminophen.  Hydration:  Drink plenty of liquids (6-8 glasses of water  daily). If the air in your home is dry, use a cool mist humidifier  Expected Course:  Sinus congestion from viral upper respiratory infections (colds) usually lasts 5-10 days.  Occasionally a cold can worsen and turn into bacterial sinusitis. Clues to this are sinus symptoms lasting longer than 10 days, fever lasting longer than 3 days, and worsening pain. Bacterial sinusitis may need antibiotic treatment.  Call Back If:   Severe pain lasts longer than 2 hours after pain medicine  Sinus pain lasts longer than 1 day after starting treatment using nasal washes  Sinus congestion (fullness) lasts longer than 10 days  Fever lasts longer than 3 days  You become worse.  Fluids:   Drink extra fluids. Drink 8-10 glasses of liquids a day (Reason: to produce a dilute, non-irritating urine).  Cranberry Juice:   Some people think that drinking cranberry juice may help in fighting urinary tract infections. However, there is no good research that has ever proved this.  Dosage Cranberry Juice Cocktail: 8 oz (240 ml) twice a day.  Dosage 100% Cranberry Juice: 1 oz (30 ml) twice a day.  Call Back If:  You become worse.  Patient Will Follow Care Advice:No - refused appointment today 05/07/14 at another location or to go to UC. Insists on being seen in the office on 05/08/14 by Dr. Beverely Risen   Appointment Scheduled:  05/08/2014 10:15:00 Appointment Scheduled Provider:  Bluford Kaufmann (Family Practice > 33yrs old)

## 2014-05-08 ENCOUNTER — Ambulatory Visit (INDEPENDENT_AMBULATORY_CARE_PROVIDER_SITE_OTHER): Payer: Medicare Other | Admitting: Internal Medicine

## 2014-05-08 ENCOUNTER — Encounter: Payer: Self-pay | Admitting: Internal Medicine

## 2014-05-08 VITALS — BP 130/74 | HR 91 | Temp 98.9°F | Resp 20 | Ht 61.5 in | Wt 154.0 lb

## 2014-05-08 DIAGNOSIS — J309 Allergic rhinitis, unspecified: Secondary | ICD-10-CM

## 2014-05-08 DIAGNOSIS — N390 Urinary tract infection, site not specified: Secondary | ICD-10-CM | POA: Diagnosis not present

## 2014-05-08 DIAGNOSIS — K219 Gastro-esophageal reflux disease without esophagitis: Secondary | ICD-10-CM

## 2014-05-08 DIAGNOSIS — R300 Dysuria: Secondary | ICD-10-CM

## 2014-05-08 LAB — POCT URINALYSIS DIPSTICK
BILIRUBIN UA: NEGATIVE
Glucose, UA: NEGATIVE
Ketones, UA: NEGATIVE
NITRITE UA: NEGATIVE
Protein, UA: NEGATIVE
Spec Grav, UA: 1.01
Urobilinogen, UA: 0.2
pH, UA: 6.5

## 2014-05-08 MED ORDER — HYDROCODONE-HOMATROPINE 5-1.5 MG/5ML PO SYRP: 5.0000 mL | ORAL_SOLUTION | Freq: Four times a day (QID) | ORAL | Status: DC | PRN

## 2014-05-08 MED ORDER — AMOXICILLIN-POT CLAVULANATE 875-125 MG PO TABS: 1.0000 | ORAL_TABLET | Freq: Two times a day (BID) | ORAL | Status: DC

## 2014-05-08 NOTE — Progress Notes (Signed)
Subjective:    Patient ID: Victoria Bryant, female    DOB: 09-08-1946, 68 y.o.   MRN: [id number]  HPI 68 year old patient has chronic medical problems include allergic rhinitis and gastroesophageal reflux disease.  She has been quite ill for the past 5 days.  She's had fever, nasal chest congestion, and productive cough.  She has developed a worsening headaches and facial pain.  More recently has had some burning dysuria.  Past Medical History  Diagnosis Date  . COLONIC POLYPS, HX OF 04/21/2007  . DEPRESSION 05/26/2009  . GERD 04/08/2008  . 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 MENOPAUSAL SYNDROME 04/21/2007  . NECK PAIN, CHRONIC 04/21/2007    History   Social History  . Marital Status: Married    Spouse Name: N/A    Number of Children: N/A  . Years of Education: N/A   Occupational History  . Not on file.   Social History Main Topics  . Smoking status: Former Smoker    Quit date: 12/06/1973  . Smokeless tobacco: Never Used  . Alcohol Use: No  . Drug Use: No  . Sexual Activity: Not on file   Other Topics Concern  . Not on file   Social History Narrative  . No narrative on file    Past Surgical History  Procedure Laterality Date  . Abdominal hysterectomy    . Tonsillectomy    . Incision and drainage abscess anal      No family history on file.  Allergies  Allergen Reactions  . Clarithromycin   . Doxycycline Hyclate     REACTION: unspecified  . Erythromycin Ethylsuccinate     REACTION: unspecified  . Paroxetine     REACTION: unspecified    Current Outpatient Prescriptions on File Prior to Visit  Medication Sig Dispense Refill  . ALPRAZolam (XANAX) 0.5 MG tablet Take 1 tablet (0.5 mg total) by mouth daily.  30 tablet  5  . estradiol (VIVELLE-DOT) 0.05 MG/24HR patch Place 1 patch (0.05 mg total) onto the skin 2 (two) times a week.  8 patch  5  . fluticasone (FLONASE) 50 MCG/ACT nasal spray Place 1 spray into the nose daily.  16 g  5  . hyoscyamine  (LEVSIN/SL) 0.125 MG SL tablet Place 1 tablet (0.125 mg total) under the tongue every 6 (six) hours as needed.  60 tablet  1  . meloxicam (MOBIC) 15 MG tablet Take 1 tablet (15 mg total) by mouth daily.  90 tablet  3  . pantoprazole (PROTONIX) 40 MG tablet Take 1 tablet (40 mg total) by mouth daily.  90 tablet  3  . tolterodine (DETROL LA) 4 MG 24 hr capsule Take 4 mg by mouth daily as needed.      . [DISCONTINUED] tolterodine (DETROL LA) 4 MG 24 hr capsule Take 1 capsule (4 mg total) by mouth daily.  90 capsule  6   No current facility-administered medications on file prior to visit.    BP 130/74  Pulse 91  Temp(Src) 98.9 F (37.2 C) (Oral)  Resp 20  Ht 5' 1.5" (1.562 m)  Wt 154 lb (69.854 kg)  BMI 28.63 kg/m2  SpO2 95%       Review of Systems  Constitutional: Positive for fever, activity change, appetite change and fatigue.  HENT: Positive for congestion, rhinorrhea and sinus pressure. Negative for dental problem, hearing loss, sore throat and tinnitus.   Eyes: Negative for pain, discharge and visual disturbance.  Respiratory: Positive for cough and wheezing.  Negative for shortness of breath.   Cardiovascular: Negative for chest pain, palpitations and leg swelling.  Gastrointestinal: Negative for nausea, vomiting, abdominal pain, diarrhea, constipation, blood in stool and abdominal distention.  Genitourinary: Negative for dysuria, urgency, frequency, hematuria, flank pain, vaginal bleeding, vaginal discharge, difficulty urinating, vaginal pain and pelvic pain.  Musculoskeletal: Negative for arthralgias, gait problem and joint swelling.  Skin: Negative for rash.  Neurological: Positive for headaches. Negative for dizziness, syncope, speech difficulty, weakness and numbness.  Hematological: Negative for adenopathy.  Psychiatric/Behavioral: Negative for behavioral problems, dysphoric mood and agitation. The patient is not nervous/anxious.        Objective:   Physical Exam    Constitutional: She is oriented to person, place, and time. She appears well-developed and well-nourished.  Appears unwell, but in no acute distress Afebrile  HENT:  Head: Normocephalic.  Right Ear: External ear normal.  Left Ear: External ear normal.  Mouth/Throat: Oropharynx is clear and moist.  Eyes: Conjunctivae and EOM are normal. Pupils are equal, round, and reactive to light.  Neck: Normal range of motion. Neck supple. No thyromegaly present.  Cardiovascular: Normal rate, regular rhythm, normal heart sounds and intact distal pulses.   Pulse 90  Pulmonary/Chest: Effort normal and breath sounds normal.  O2 saturation 95% Few scattered faint wheezes Few crackles right base  Abdominal: Soft. Bowel sounds are normal. She exhibits no mass. There is no tenderness.  Musculoskeletal: Normal range of motion.  Lymphadenopathy:    She has no cervical adenopathy.  Neurological: She is alert and oriented to person, place, and time.  Skin: Skin is warm and dry. No rash noted.  Psychiatric: She has a normal mood and affect. Her behavior is normal.          Assessment & Plan:   URI Sinusitis Early UTI with dysuria and mild pyuria Allergic rhinitis  Will treat with  Augmentin for 7 days expectorants Patient requests antitussive

## 2014-05-08 NOTE — Progress Notes (Signed)
Pre-visit discussion using our clinic review tool. No additional management support is needed unless otherwise documented below in the visit note.  

## 2014-05-08 NOTE — Patient Instructions (Signed)
    Use saline irrigation, warm  moist compresses and over-the-counter decongestants only as directed.  Call if there is no improvement in 5 to 7 days, or sooner if you develop increasing pain, fever, or any new symptoms.  Take your antibiotic as prescribed until ALL of it is gone, but stop if you develop a rash, swelling, or any side effects of the medication.  Contact our office as soon as possible if  there are side effects of the medication. 

## 2014-05-27 DIAGNOSIS — H43399 Other vitreous opacities, unspecified eye: Secondary | ICD-10-CM | POA: Diagnosis not present

## 2014-07-16 ENCOUNTER — Ambulatory Visit: Payer: Medicare Other | Admitting: Internal Medicine

## 2014-08-16 ENCOUNTER — Telehealth: Payer: Self-pay | Admitting: Internal Medicine

## 2014-08-16 NOTE — Telephone Encounter (Signed)
Pt needs her cpx before end dec 2015. Can I create 30 min slot?

## 2014-08-16 NOTE — Telephone Encounter (Signed)
Yes, you can

## 2014-08-19 NOTE — Telephone Encounter (Signed)
Pt has been sch

## 2014-09-05 LAB — HM MAMMOGRAPHY

## 2014-09-17 DIAGNOSIS — Z124 Encounter for screening for malignant neoplasm of cervix: Secondary | ICD-10-CM | POA: Diagnosis not present

## 2014-09-17 DIAGNOSIS — N3010 Interstitial cystitis (chronic) without hematuria: Secondary | ICD-10-CM | POA: Diagnosis not present

## 2014-09-17 DIAGNOSIS — L292 Pruritus vulvae: Secondary | ICD-10-CM | POA: Diagnosis not present

## 2014-09-17 DIAGNOSIS — Z1231 Encounter for screening mammogram for malignant neoplasm of breast: Secondary | ICD-10-CM | POA: Diagnosis not present

## 2014-09-17 DIAGNOSIS — N951 Menopausal and female climacteric states: Secondary | ICD-10-CM | POA: Diagnosis not present

## 2014-09-17 DIAGNOSIS — Z23 Encounter for immunization: Secondary | ICD-10-CM | POA: Diagnosis not present

## 2014-09-27 ENCOUNTER — Ambulatory Visit (INDEPENDENT_AMBULATORY_CARE_PROVIDER_SITE_OTHER): Payer: Medicare Other | Admitting: Internal Medicine

## 2014-09-27 ENCOUNTER — Encounter: Payer: Self-pay | Admitting: Internal Medicine

## 2014-09-27 VITALS — BP 140/80 | HR 76 | Temp 98.5°F | Resp 20 | Ht 62.0 in | Wt 156.0 lb

## 2014-09-27 DIAGNOSIS — J3089 Other allergic rhinitis: Secondary | ICD-10-CM

## 2014-09-27 DIAGNOSIS — K219 Gastro-esophageal reflux disease without esophagitis: Secondary | ICD-10-CM | POA: Diagnosis not present

## 2014-09-27 DIAGNOSIS — M542 Cervicalgia: Secondary | ICD-10-CM | POA: Diagnosis not present

## 2014-09-27 DIAGNOSIS — Z86010 Personal history of colonic polyps: Secondary | ICD-10-CM

## 2014-09-27 DIAGNOSIS — N951 Menopausal and female climacteric states: Secondary | ICD-10-CM

## 2014-09-27 DIAGNOSIS — E785 Hyperlipidemia, unspecified: Secondary | ICD-10-CM

## 2014-09-27 DIAGNOSIS — R300 Dysuria: Secondary | ICD-10-CM

## 2014-09-27 DIAGNOSIS — Z0000 Encounter for general adult medical examination without abnormal findings: Secondary | ICD-10-CM

## 2014-09-27 DIAGNOSIS — R7989 Other specified abnormal findings of blood chemistry: Secondary | ICD-10-CM

## 2014-09-27 DIAGNOSIS — R946 Abnormal results of thyroid function studies: Secondary | ICD-10-CM | POA: Diagnosis not present

## 2014-09-27 LAB — COMPREHENSIVE METABOLIC PANEL
ALT: 19 U/L (ref 0–35)
AST: 19 U/L (ref 0–37)
Albumin: 3.7 g/dL (ref 3.5–5.2)
Alkaline Phosphatase: 75 U/L (ref 39–117)
BILIRUBIN TOTAL: 0.5 mg/dL (ref 0.2–1.2)
BUN: 10 mg/dL (ref 6–23)
CALCIUM: 9.4 mg/dL (ref 8.4–10.5)
CO2: 26 mEq/L (ref 19–32)
Chloride: 103 mEq/L (ref 96–112)
Creatinine, Ser: 0.8 mg/dL (ref 0.4–1.2)
GFR: 78.92 mL/min (ref >60.00)
Glucose, Bld: 88 mg/dL (ref 70–99)
Potassium: 4.6 mEq/L (ref 3.5–5.1)
Sodium: 137 mEq/L (ref 135–145)
Total Protein: 7.6 g/dL (ref 6.0–8.3)

## 2014-09-27 LAB — CBC WITH DIFFERENTIAL/PLATELET
BASOS PCT: 0.5 % (ref 0.0–3.0)
Basophils Absolute: 0 10*3/uL (ref 0.0–0.1)
EOS PCT: 3.2 % (ref 0.0–5.0)
Eosinophils Absolute: 0.3 10*3/uL (ref 0.0–0.7)
HEMATOCRIT: 40.8 % (ref 36.0–46.0)
HEMOGLOBIN: 13.7 g/dL (ref 12.0–15.0)
LYMPHS ABS: 2.7 10*3/uL (ref 0.7–4.0)
LYMPHS PCT: 28.3 % (ref 12.0–46.0)
MCHC: 33.6 g/dL (ref 30.0–36.0)
MCV: 91.3 fl (ref 78.0–100.0)
MONOS PCT: 7 % (ref 3.0–12.0)
Monocytes Absolute: 0.7 10*3/uL (ref 0.1–1.0)
NEUTROS ABS: 5.8 10*3/uL (ref 1.4–7.7)
Neutrophils Relative %: 61 % (ref 43.0–77.0)
Platelets: 269 10*3/uL (ref 150.0–400.0)
RBC: 4.46 Mil/uL (ref 3.87–5.11)
RDW: 12.6 % (ref 11.5–15.5)
WBC: 9.5 10*3/uL (ref 4.0–10.5)

## 2014-09-27 LAB — LIPID PANEL
Cholesterol: 230 mg/dL — ABNORMAL HIGH (ref 0–200)
HDL: 53.9 mg/dL (ref >39.00)
LDL CALC: 138 mg/dL — AB (ref 0–99)
NONHDL: 176.1
Total CHOL/HDL Ratio: 4
Triglycerides: 189 mg/dL — ABNORMAL HIGH (ref 0.0–149.0)
VLDL: 37.8 mg/dL (ref 0.0–40.0)

## 2014-09-27 LAB — POCT URINALYSIS DIPSTICK
BILIRUBIN UA: NEGATIVE
Blood, UA: NEGATIVE
GLUCOSE UA: NEGATIVE
Ketones, UA: NEGATIVE
NITRITE UA: NEGATIVE
Protein, UA: NEGATIVE
Spec Grav, UA: 1.01
Urobilinogen, UA: 0.2
pH, UA: 6

## 2014-09-27 LAB — TSH: TSH: 3.33 u[IU]/mL (ref 0.35–4.50)

## 2014-09-27 MED ORDER — PANTOPRAZOLE SODIUM 40 MG PO TBEC: 40.0000 mg | DELAYED_RELEASE_TABLET | Freq: Every day | ORAL | Status: DC

## 2014-09-27 MED ORDER — FLUTICASONE PROPIONATE 50 MCG/ACT NA SUSP: 1.0000 | Freq: Every day | NASAL | Status: DC

## 2014-09-27 MED ORDER — ALPRAZOLAM 0.5 MG PO TABS: 0.5000 mg | ORAL_TABLET | Freq: Every day | ORAL | Status: DC

## 2014-09-27 MED ORDER — HYOSCYAMINE SULFATE 0.125 MG SL SUBL: 0.1250 mg | SUBLINGUAL_TABLET | Freq: Four times a day (QID) | SUBLINGUAL | Status: DC | PRN

## 2014-09-27 MED ORDER — MELOXICAM 15 MG PO TABS: 15.0000 mg | ORAL_TABLET | Freq: Every day | ORAL | Status: DC

## 2014-09-27 NOTE — Progress Notes (Signed)
Patient ID: Victoria Bryant, female   DOB: 05-18-1946, 68 y.o.   MRN: [id number] Patient ID: Victoria Bryant, female   DOB: 02-Nov-1946, 68 y.o.   MRN: [id number]  Subjective:    Patient ID: Victoria Bryant, female    DOB: 1946/01/08, 68 y.o.   MRN: [id number]  HPI 68 year old patient who is seen today for a preventative health examination.  She enjoys excellent health.  She does have a history of hammertoe deformity and did have surgery. She has a history of anxiety depression and some chronic neck pain. She has a history of colonic polyps and allergic rhinitis she has remote history of a malignant melanoma.  She has annual mammograms and gynecologic evaluation.  She has also been seen by GI.  This year and has had upper and lower endoscopy by Dr. Amedeo Plenty  Here for Medicare AWV:   1. Risk factors based on Past M, S, F history: no significant cardiovascular risk factors.  2. Physical Activities: enjoys a 3-minute exercise regimen, almost every day  3. Depression/mood: history depression, which is now clinically stable has required treatment in the past. Presently takes alprazolam p.r.n. anxiety  4. Hearing: no impairment  5. ADL's: independent in all aspects of daily living  6. Fall Risk: low  7. Home Safety: No problems  identified  8. Height, weight, &visual acuity:height and weight stable. No problems with visual acuity  9. Counseling: regular exercise, calcium supplementation with vitamin D. All encouraged  10. Labs ordered based on risk factors: laboratory profile, including lipid panel will be reviewed  11. Referral Coordination- follow-up GYN as scheduled  12. Care Plan- colonoscopy due in 2 years  68. Cognitive Assessment- alert and oriented normal affect. No memory disturbance. No difficulty handling all executive functions  14.  Preventive services include annual health assessment.  She'll receive annual mammograms and gynecologic evaluations.  She is seen by GI periodically for  surveillance due to the history of colonic polyps.  Patient was provided with a written and personalized care plan 15.  Provider list includes gynecology primary care GI and dermatology, as well as radiology   Allergies:  1) ! Biaxin  2) Amoxicillin (Amoxicillin)  3) Erythromycin Ethylsuccinate (Erythromycin Ethylsuccinate)  4) Paxil (Paroxetine Hcl)  5) Periostat (Doxycycline Hyclate)   Past History:  Past Medical History:   Allergic rhinitis  Colonic polyps, hx of  GERD  Palpitations  Depression   Past Surgical History:  Hysterectomy 1976  Tonsillectomy  colonoscopy and upper gastroduodenoscopy 2009, 2014 EGD 2011  melanoma resection, left arm, 1991  history rectal abscess  Surgery for hammertoe deformity  Family History:   details of her father's health unknown  mother died age 85 from bladder cancer; also status post resection for lung cancer  One brother: history of hypertension  one sister. History hypertension, obesity, and dyslipidemia, CNS hemorrhage  Aunt: breast cancer, cerebral hemorrhage  Social History:   Married  discontinued tobacco 20 years ago  retired May 2009    Past Medical History  Diagnosis Date  . COLONIC POLYPS, HX OF 04/21/2007  . DEPRESSION 05/26/2009  . GERD 04/08/2008  . 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 MENOPAUSAL SYNDROME 04/21/2007  . NECK PAIN, CHRONIC 04/21/2007   Past Surgical History  Procedure Laterality Date  . Abdominal hysterectomy    . Tonsillectomy    . Incision and drainage abscess anal      reports that she quit smoking about 40 years ago. She has never used smokeless  tobacco. She reports that she does not drink alcohol or use illicit drugs. family history is not on file. Allergies  Allergen Reactions  . Clarithromycin   . Doxycycline Hyclate     REACTION: unspecified  . Erythromycin Ethylsuccinate     REACTION: unspecified  . Paroxetine     REACTION: unspecified     Review of Systems   Constitutional: Negative for fever, appetite change, fatigue and unexpected weight change.  HENT: Positive for sore throat. Negative for congestion, dental problem, ear pain, hearing loss, mouth sores, nosebleeds, sinus pressure, tinnitus, trouble swallowing and voice change.   Eyes: Negative for photophobia, pain, redness and visual disturbance.  Respiratory: Negative for cough, chest tightness and shortness of breath.   Cardiovascular: Negative for chest pain, palpitations and leg swelling.  Gastrointestinal: Negative for nausea, vomiting, abdominal pain, diarrhea, constipation, blood in stool, abdominal distention and rectal pain.  Genitourinary: Negative for dysuria, urgency, frequency, hematuria, flank pain, vaginal bleeding, vaginal discharge, difficulty urinating, genital sores, vaginal pain, menstrual problem and pelvic pain.  Musculoskeletal: Positive for arthralgias. Negative for back pain and neck stiffness.       Left second toe pain recent onset  Skin: Negative for rash.  Neurological: Negative for dizziness, syncope, speech difficulty, weakness, light-headedness, numbness and headaches.  Hematological: Negative for adenopathy. Does not bruise/bleed easily.  Psychiatric/Behavioral: Negative for suicidal ideas, behavioral problems, self-injury, dysphoric mood and agitation. The patient is not nervous/anxious.        Objective:   Physical Exam  Constitutional: She is oriented to person, place, and time. She appears well-developed and well-nourished.  HENT:  Head: Normocephalic and atraumatic.  Right Ear: External ear normal.  Left Ear: External ear normal.  Mouth/Throat: Oropharynx is clear and moist.  Eyes: Conjunctivae and EOM are normal.  Neck: Normal range of motion. Neck supple. No JVD present. No thyromegaly present.  Cardiovascular: Normal rate, regular rhythm, normal heart sounds and intact distal pulses.   No murmur heard. Dorsalis pedis pulses faint ; decreased  right posterior tibial pulse  Pulmonary/Chest: Effort normal and breath sounds normal. She has no wheezes. She has no rales.  Abdominal: Soft. Bowel sounds are normal. She exhibits no distension and no mass. There is no tenderness. There is no rebound and no guarding.  Musculoskeletal: Normal range of motion. She exhibits no edema and no tenderness.  Neurological: She is alert and oriented to person, place, and time. She has normal reflexes. No cranial nerve deficit. She exhibits normal muscle tone. Coordination normal.  Skin: Skin is warm and dry. No rash noted.  Psychiatric: She has a normal mood and affect. Her behavior is normal.          Assessment & Plan:   Preventive health examination History of gastroesophageal reflux disease stable Allergic rhinitis stable History of colonic polyps. Followup colonoscopy in 5 years  Gynecologic followup .

## 2014-09-27 NOTE — Progress Notes (Signed)
Pre visit review using our clinic review tool, if applicable. No additional management support is needed unless otherwise documented below in the visit note. 

## 2014-09-27 NOTE — Patient Instructions (Addendum)
Limit your sodium (Salt) intake    It is important that you exercise regularly, at least 20 minutes 3 to 4 times per week.  If you develop chest pain or shortness of breath seek  medical attention.  Take a calcium supplement, plus 800-1200 units of vitamin D  Health Maintenance Adopting a healthy lifestyle and getting preventive care can go a long way to promote health and wellness. Talk with your health care provider about what schedule of regular examinations is right for you. This is a good chance for you to check in with your provider about disease prevention and staying healthy. In between checkups, there are plenty of things you can do on your own. Experts have done a lot of research about which lifestyle changes and preventive measures are most likely to keep you healthy. Ask your health care provider for more information. WEIGHT AND DIET  Eat a healthy diet  Be sure to include plenty of vegetables, fruits, low-fat dairy products, and lean protein.  Do not eat a lot of foods high in solid fats, added sugars, or salt.  Get regular exercise. This is one of the most important things you can do for your health.  Most adults should exercise for at least 150 minutes each week. The exercise should increase your heart rate and make you sweat (moderate-intensity exercise).  Most adults should also do strengthening exercises at least twice a week. This is in addition to the moderate-intensity exercise.  Maintain a healthy weight  Body mass index (BMI) is a measurement that can be used to identify possible weight problems. It estimates body fat based on height and weight. Your health care provider can help determine your BMI and help you achieve or maintain a healthy weight.  For females 20 years of age and older:   A BMI below 18.5 is considered underweight.  A BMI of 18.5 to 24.9 is normal.  A BMI of 25 to 29.9 is considered overweight.  A BMI of 30 and above is considered obese.   Watch levels of cholesterol and blood lipids  You should start having your blood tested for lipids and cholesterol at 69 years of age, then have this test every 5 years.  You may need to have your cholesterol levels checked more often if:  Your lipid or cholesterol levels are high.  You are older than 69 years of age.  You are at high risk for heart disease.  CANCER SCREENING   Lung Cancer  Lung cancer screening is recommended for adults 55-80 years old who are at high risk for lung cancer because of a history of smoking.  A yearly low-dose CT scan of the lungs is recommended for people who:  Currently smoke.  Have quit within the past 15 years.  Have at least a 30-pack-year history of smoking. A pack year is smoking an average of one pack of cigarettes a day for 1 year.  Yearly screening should continue until it has been 15 years since you quit.  Yearly screening should stop if you develop a health problem that would prevent you from having lung cancer treatment.  Breast Cancer  Practice breast self-awareness. This means understanding how your breasts normally appear and feel.  It also means doing regular breast self-exams. Let your health care provider know about any changes, no matter how small.  If you are in your 20s or 30s, you should have a clinical breast exam (CBE) by a health care provider every 1-3 years   as part of a regular health exam.  If you are 40 or older, have a CBE every year. Also consider having a breast X-ray (mammogram) every year.  If you have a family history of breast cancer, talk to your health care provider about genetic screening.  If you are at high risk for breast cancer, talk to your health care provider about having an MRI and a mammogram every year.  Breast cancer gene (BRCA) assessment is recommended for women who have family members with BRCA-related cancers. BRCA-related cancers  include:  Breast.  Ovarian.  Tubal.  Peritoneal cancers.  Results of the assessment will determine the need for genetic counseling and BRCA1 and BRCA2 testing. Cervical Cancer Routine pelvic examinations to screen for cervical cancer are no longer recommended for nonpregnant women who are considered low risk for cancer of the pelvic organs (ovaries, uterus, and vagina) and who do not have symptoms. A pelvic examination may be necessary if you have symptoms including those associated with pelvic infections. Ask your health care provider if a screening pelvic exam is right for you.   The Pap test is the screening test for cervical cancer for women who are considered at risk.  If you had a hysterectomy for a problem that was not cancer or a condition that could lead to cancer, then you no longer need Pap tests.  If you are older than 65 years, and you have had normal Pap tests for the past 10 years, you no longer need to have Pap tests.  If you have had past treatment for cervical cancer or a condition that could lead to cancer, you need Pap tests and screening for cancer for at least 20 years after your treatment.  If you no longer get a Pap test, assess your risk factors if they change (such as having a new sexual partner). This can affect whether you should start being screened again.  Some women have medical problems that increase their chance of getting cervical cancer. If this is the case for you, your health care provider may recommend more frequent screening and Pap tests.  The human papillomavirus (HPV) test is another test that may be used for cervical cancer screening. The HPV test looks for the virus that can cause cell changes in the cervix. The cells collected during the Pap test can be tested for HPV.  The HPV test can be used to screen women 30 years of age and older. Getting tested for HPV can extend the interval between normal Pap tests from three to five years.  An HPV  test also should be used to screen women of any age who have unclear Pap test results.  After 69 years of age, women should have HPV testing as often as Pap tests.  Colorectal Cancer  This type of cancer can be detected and often prevented.  Routine colorectal cancer screening usually begins at 69 years of age and continues through 69 years of age.  Your health care provider may recommend screening at an earlier age if you have risk factors for colon cancer.  Your health care provider may also recommend using home test kits to check for hidden blood in the stool.  A small camera at the end of a tube can be used to examine your colon directly (sigmoidoscopy or colonoscopy). This is done to check for the earliest forms of colorectal cancer.  Routine screening usually begins at age 50.  Direct examination of the colon should be repeated every   5-10 years through 69 years of age. However, you may need to be screened more often if early forms of precancerous polyps or small growths are found. Skin Cancer  Check your skin from head to toe regularly.  Tell your health care provider about any new moles or changes in moles, especially if there is a change in a mole's shape or color.  Also tell your health care provider if you have a mole that is larger than the size of a pencil eraser.  Always use sunscreen. Apply sunscreen liberally and repeatedly throughout the day.  Protect yourself by wearing long sleeves, pants, a wide-brimmed hat, and sunglasses whenever you are outside. HEART DISEASE, DIABETES, AND HIGH BLOOD PRESSURE   Have your blood pressure checked at least every 1-2 years. High blood pressure causes heart disease and increases the risk of stroke.  If you are between 55 years and 79 years old, ask your health care provider if you should take aspirin to prevent strokes.  Have regular diabetes screenings. This involves taking a blood sample to check your fasting blood sugar  level.  If you are at a normal weight and have a low risk for diabetes, have this test once every three years after 69 years of age.  If you are overweight and have a high risk for diabetes, consider being tested at a younger age or more often. PREVENTING INFECTION  Hepatitis B  If you have a higher risk for hepatitis B, you should be screened for this virus. You are considered at high risk for hepatitis B if:  You were born in a country where hepatitis B is common. Ask your health care provider which countries are considered high risk.  Your parents were born in a high-risk country, and you have not been immunized against hepatitis B (hepatitis B vaccine).  You have HIV or AIDS.  You use needles to inject street drugs.  You live with someone who has hepatitis B.  You have had sex with someone who has hepatitis B.  You get hemodialysis treatment.  You take certain medicines for conditions, including cancer, organ transplantation, and autoimmune conditions. Hepatitis C  Blood testing is recommended for:  Everyone born from 1945 through 1965.  Anyone with known risk factors for hepatitis C. Sexually transmitted infections (STIs)  You should be screened for sexually transmitted infections (STIs) including gonorrhea and chlamydia if:  You are sexually active and are younger than 69 years of age.  You are older than 69 years of age and your health care provider tells you that you are at risk for this type of infection.  Your sexual activity has changed since you were last screened and you are at an increased risk for chlamydia or gonorrhea. Ask your health care provider if you are at risk.  If you do not have HIV, but are at risk, it may be recommended that you take a prescription medicine daily to prevent HIV infection. This is called pre-exposure prophylaxis (PrEP). You are considered at risk if:  You are sexually active and do not regularly use condoms or know the HIV status  of your partner(s).  You take drugs by injection.  You are sexually active with a partner who has HIV. Talk with your health care provider about whether you are at high risk of being infected with HIV. If you choose to begin PrEP, you should first be tested for HIV. You should then be tested every 3 months for as long as you   are taking PrEP.  PREGNANCY   If you are premenopausal and you may become pregnant, ask your health care provider about preconception counseling.  If you may become pregnant, take 400 to 800 micrograms (mcg) of folic acid every day.  If you want to prevent pregnancy, talk to your health care provider about birth control (contraception). OSTEOPOROSIS AND MENOPAUSE   Osteoporosis is a disease in which the bones lose minerals and strength with aging. This can result in serious bone fractures. Your risk for osteoporosis can be identified using a bone density scan.  If you are 65 years of age or older, or if you are at risk for osteoporosis and fractures, ask your health care provider if you should be screened.  Ask your health care provider whether you should take a calcium or vitamin D supplement to lower your risk for osteoporosis.  Menopause may have certain physical symptoms and risks.  Hormone replacement therapy may reduce some of these symptoms and risks. Talk to your health care provider about whether hormone replacement therapy is right for you.  HOME CARE INSTRUCTIONS   Schedule regular health, dental, and eye exams.  Stay current with your immunizations.   Do not use any tobacco products including cigarettes, chewing tobacco, or electronic cigarettes.  If you are pregnant, do not drink alcohol.  If you are breastfeeding, limit how much and how often you drink alcohol.  Limit alcohol intake to no more than 1 drink per day for nonpregnant women. One drink equals 12 ounces of beer, 5 ounces of wine, or 1 ounces of hard liquor.  Do not use street  drugs.  Do not share needles.  Ask your health care provider for help if you need support or information about quitting drugs.  Tell your health care provider if you often feel depressed.  Tell your health care provider if you have ever been abused or do not feel safe at home. Document Released: 06/07/2011 Document Revised: 04/08/2014 Document Reviewed: 10/24/2013 [employer] Patient Information 2015 [employer]. This information is not intended to replace advice given to you by your health care provider. Make sure you discuss any questions you have with your health care provider.  

## 2014-10-21 ENCOUNTER — Ambulatory Visit: Payer: Medicare Other | Admitting: Family Medicine

## 2014-10-21 ENCOUNTER — Encounter: Payer: Self-pay | Admitting: Family Medicine

## 2014-10-21 ENCOUNTER — Ambulatory Visit (INDEPENDENT_AMBULATORY_CARE_PROVIDER_SITE_OTHER): Payer: Medicare Other | Admitting: Family Medicine

## 2014-10-21 VITALS — BP 144/83 | HR 85 | Temp 99.2°F | Resp 18 | Ht 62.0 in | Wt 155.0 lb

## 2014-10-21 DIAGNOSIS — R3915 Urgency of urination: Secondary | ICD-10-CM | POA: Diagnosis not present

## 2014-10-21 DIAGNOSIS — R1030 Lower abdominal pain, unspecified: Secondary | ICD-10-CM

## 2014-10-21 DIAGNOSIS — J209 Acute bronchitis, unspecified: Secondary | ICD-10-CM

## 2014-10-21 DIAGNOSIS — J018 Other acute sinusitis: Secondary | ICD-10-CM

## 2014-10-21 DIAGNOSIS — J029 Acute pharyngitis, unspecified: Secondary | ICD-10-CM

## 2014-10-21 DIAGNOSIS — R109 Unspecified abdominal pain: Secondary | ICD-10-CM | POA: Diagnosis not present

## 2014-10-21 LAB — POCT URINALYSIS DIPSTICK
BILIRUBIN UA: NEGATIVE
GLUCOSE UA: NEGATIVE
Ketones, UA: NEGATIVE
LEUKOCYTES UA: NEGATIVE
NITRITE UA: NEGATIVE
PH UA: 7
Protein, UA: NEGATIVE
Spec Grav, UA: 1.015
UROBILINOGEN UA: 0.2

## 2014-10-21 LAB — POCT RAPID STREP A (OFFICE): Rapid Strep A Screen: NEGATIVE

## 2014-10-21 MED ORDER — AZITHROMYCIN 250 MG PO TABS: ORAL_TABLET | ORAL | Status: DC

## 2014-10-21 MED ORDER — PREDNISONE 20 MG PO TABS: ORAL_TABLET | ORAL | Status: DC

## 2014-10-21 NOTE — Progress Notes (Signed)
Pre visit review using our clinic review tool, if applicable. No additional management support is needed unless otherwise documented below in the visit note. 

## 2014-10-21 NOTE — Progress Notes (Signed)
OFFICE NOTE  10/21/2014  CC:  Chief Complaint  Patient presents with  . Sore Throat  . Neck Pain  . Cough   HPI: Patient is a 68 y.o. Caucasian female who is here for ST and cough.   Has had several weeks of gradually worsening ST, cough, fatigue, nasal congestion/PND, feels wheezing.   Achy but no fever.  Taking OTC cough/cold med and benadryl. Got flu vaccine about 1 mo ago.  Not a smoker.  Pertinent PMH:  Past medical, surgical, social, and family history reviewed and no changes are noted since last office visit.  MEDS:  Outpatient Prescriptions Prior to Visit  Medication Sig Dispense Refill  . ALPRAZolam (XANAX) 0.5 MG tablet Take 1 tablet (0.5 mg total) by mouth daily. 30 tablet 5  . estradiol (VIVELLE-DOT) 0.05 MG/24HR patch Place 1 patch (0.05 mg total) onto the skin 2 (two) times a week. 8 patch 5  . fluticasone (FLONASE) 50 MCG/ACT nasal spray Place 1 spray into both nostrils daily. 16 g 5  . hyoscyamine (LEVSIN/SL) 0.125 MG SL tablet Place 1 tablet (0.125 mg total) under the tongue every 6 (six) hours as needed. 90 tablet 3  . meloxicam (MOBIC) 15 MG tablet Take 1 tablet (15 mg total) by mouth daily. 90 tablet 3  . pantoprazole (PROTONIX) 40 MG tablet Take 1 tablet (40 mg total) by mouth daily. 90 tablet 3  . tolterodine (DETROL LA) 4 MG 24 hr capsule Take 4 mg by mouth daily as needed.     No facility-administered medications prior to visit.    PE: Blood pressure 144/83, pulse 85, temperature 99.2 F (37.3 C), temperature source Temporal, resp. rate 18, height 5\' 2"  (1.575 m), weight 155 lb (70.308 kg), SpO2 94 %. VS: noted--normal. Gen: alert, NAD, NONTOXIC APPEARING. HEENT: eyes without injection, drainage, or swelling.  Ears: EACs clear, TMs with normal light reflex and landmarks.  Nose: Clear rhinorrhea, with some dried, crusty exudate adherent to mildly injected mucosa.  No purulent d/c.  No paranasal sinus TTP.  No facial swelling.  Throat and mouth without  focal lesion.  No pharyngial swelling, erythema, or exudate.   Neck: supple, no LAD.   LUNGS: CTA bilat, nonlabored resps.  Trace coarse end exp wheezing with mild post-exhalation coughing and mild prolongation of exp phase. CV: RRR, no m/r/g. EXT: no c/c/e SKIN: no rash  LAB: rapid strep: NEG CC UA trace intact blood  IMPRESSION AND PLAN:  Acute sinusitis/prolonged URI, with prolonged acute bronchitis. Sx's gradually worsening. Z-pack. Prednisone 40mg  qd x 5d, then 20mg  qd x 5d. Pt with mildly abnl UA at CPE exam 09/27/14, says she still has some lower abd discomfort and urinary urgency but also mentions hx of IC. UA done today: trace intact blood, will send for c/s.  An After Visit Summary was printed and given to the patient.  FOLLOW UP: prn

## 2014-10-21 NOTE — Addendum Note (Signed)
Addended by: Jannette Spanner on: 10/21/2014 11:43 AM   Modules accepted: Orders

## 2014-10-21 NOTE — Patient Instructions (Signed)
Buy OTC generic robitussin DM and use as directed on packaging.

## 2014-10-22 LAB — URINE CULTURE
COLONY COUNT: NO GROWTH
Organism ID, Bacteria: NO GROWTH

## 2014-10-23 LAB — CULTURE, GROUP A STREP: Organism ID, Bacteria: NORMAL

## 2014-11-20 ENCOUNTER — Encounter: Payer: Medicare Other | Admitting: Internal Medicine

## 2014-12-11 ENCOUNTER — Ambulatory Visit (INDEPENDENT_AMBULATORY_CARE_PROVIDER_SITE_OTHER): Payer: Medicare Other | Admitting: Nurse Practitioner

## 2014-12-11 ENCOUNTER — Encounter: Payer: Self-pay | Admitting: Nurse Practitioner

## 2014-12-11 VITALS — BP 129/77 | HR 79 | Temp 97.5°F | Ht 62.0 in | Wt 157.0 lb

## 2014-12-11 DIAGNOSIS — R599 Enlarged lymph nodes, unspecified: Secondary | ICD-10-CM

## 2014-12-11 DIAGNOSIS — J069 Acute upper respiratory infection, unspecified: Secondary | ICD-10-CM

## 2014-12-11 DIAGNOSIS — J029 Acute pharyngitis, unspecified: Secondary | ICD-10-CM | POA: Diagnosis not present

## 2014-12-11 DIAGNOSIS — R590 Localized enlarged lymph nodes: Secondary | ICD-10-CM

## 2014-12-11 MED ORDER — HYDROCODONE-HOMATROPINE 5-1.5 MG/5ML PO SYRP: 5.0000 mL | ORAL_SOLUTION | Freq: Every evening | ORAL | Status: DC | PRN

## 2014-12-11 NOTE — Progress Notes (Signed)
   Subjective:    Patient ID: Victoria Bryant, female    DOB: 03-10-1946, 69 y.o.   MRN: [id number]  Sore Throat  This is a new problem. The current episode started 1 to 4 weeks ago (1wk). The problem has been unchanged. Neither side of throat is experiencing more pain than the other. There has been no fever. The pain is mild. Associated symptoms include congestion, coughing, a plugged ear sensation and swollen glands. Pertinent negatives include no abdominal pain, ear pain, headaches, shortness of breath, trouble swallowing or vomiting. She has tried nothing for the symptoms.      Review of Systems  Constitutional: Negative for fever, chills and fatigue.  HENT: Positive for congestion, postnasal drip and sore throat. Negative for ear pain and trouble swallowing.   Respiratory: Positive for cough. Negative for choking, chest tightness, shortness of breath and wheezing.   Cardiovascular: Negative for chest pain.  Gastrointestinal: Negative for vomiting and abdominal pain.  Neurological: Negative for headaches.       Objective:   Physical Exam  Constitutional: She is oriented to person, place, and time. She appears well-developed and well-nourished. No distress.  HENT:  Head: Normocephalic and atraumatic.  Right Ear: External ear normal.  Left Ear: External ear normal.  Mouth/Throat: Oropharynx is clear and moist. No oropharyngeal exudate.  Bubbles & clear fluid bilat TM, bones visible  Eyes: Conjunctivae are normal. Right eye exhibits no discharge. Left eye exhibits no discharge.  Neck: Normal range of motion. Neck supple. No thyromegaly present.  Cardiovascular: Normal rate, regular rhythm and normal heart sounds.   No murmur heard. Pulmonary/Chest: Effort normal and breath sounds normal. No respiratory distress. She has no wheezes. She has no rales.  Lymphadenopathy:    She has cervical adenopathy (L anterior cervical adenopathy-base of nexk, tender, moveable, 3 nodes).    Neurological: She is alert and oriented to person, place, and time.  Skin: Skin is warm and dry.  Psychiatric: She has a normal mood and affect. Her behavior is normal. Thought content normal.  Vitals reviewed.         Assessment & Plan:  1. Sore throat Likely viral URI, post-nasal drip - Upper Respiratory Culture - POCT rapid strep A-neg  2. Upper respiratory infection with cough and congestion Clear BS - HYDROcodone-homatropine (HYCODAN) 5-1.5 MG/5ML syrup; Take 5 mLs by mouth at bedtime as needed for cough.  Dispense: 120 mL; Refill: 0 Sinus rinses  3. Lymphadenopathy of left cervical region Likely due to viral URI  See pt instructions F/u PRN

## 2014-12-11 NOTE — Patient Instructions (Addendum)
You have a cold virus causing your symptoms. The average duration of cold symptoms is 14 days. Start daily sinus rinses (neilmed Sinus Rinse) t decrease post nasal drip & cough.  Tylenol or ibuprophen for headache. Vicks vapor rub under nose to help breathe. Benzocaine throat lozenges for sore throat.  Use cough syrup for night time. It will make you drowsy. Sip fluids every hour. Rest.  Lymph node swelling should be resolved in about 2 weeks. We will call with throat culture results & start antibiotic therapy if needed. If you are not feeling better in  2 weeks or develop fever or chest pain, call us for re-evaluation. Feel better!  Upper Respiratory Infection, Adult An upper respiratory infection (URI) is also sometimes known as the common cold. The upper respiratory tract includes the nose, sinuses, throat, trachea, and bronchi. Bronchi are the airways leading to the lungs. Most people improve within 1 week, but symptoms can last up to 2 weeks. A residual cough may last even longer.  CAUSES Many different viruses can infect the tissues lining the upper respiratory tract. The tissues become irritated and inflamed and often become very moist. Mucus production is also common. A cold is contagious. You can easily spread the virus to others by oral contact. This includes kissing, sharing a glass, coughing, or sneezing. Touching your mouth or nose and then touching a surface, which is then touched by another person, can also spread the virus. SYMPTOMS  Symptoms typically develop 1 to 3 days after you come in contact with a cold virus. Symptoms vary from person to person. They may include:  Runny nose.  Sneezing.  Nasal congestion.  Sinus irritation.  Sore throat.  Loss of voice (laryngitis).  Cough.  Fatigue.  Muscle aches.  Loss of appetite.  Headache.  Low-grade fever. DIAGNOSIS  You might diagnose your own cold based on familiar symptoms, since most people get a cold 2 to 3  times a year. Your caregiver can confirm this based on your exam. Most importantly, your caregiver can check that your symptoms are not due to another disease such as strep throat, sinusitis, pneumonia, asthma, or epiglottitis. Blood tests, throat tests, and X-rays are not necessary to diagnose a common cold, but they may sometimes be helpful in excluding other more serious diseases. Your caregiver will decide if any further tests are required. RISKS AND COMPLICATIONS  You may be at risk for a more severe case of the common cold if you smoke cigarettes, have chronic heart disease (such as heart failure) or lung disease (such as asthma), or if you have a weakened immune system. The very young and very old are also at risk for more serious infections. Bacterial sinusitis, middle ear infections, and bacterial pneumonia can complicate the common cold. The common cold can worsen asthma and chronic obstructive pulmonary disease (COPD). Sometimes, these complications can require emergency medical care and may be life-threatening. PREVENTION  The best way to protect against getting a cold is to practice good hygiene. Avoid oral or hand contact with people with cold symptoms. Wash your hands often if contact occurs. There is no clear evidence that vitamin C, vitamin E, echinacea, or exercise reduces the chance of developing a cold. However, it is always recommended to get plenty of rest and practice good nutrition. TREATMENT  Treatment is directed at relieving symptoms. There is no cure. Antibiotics are not effective, because the infection is caused by a virus, not by bacteria. Treatment may include:  Increased fluid  intake. Sports drinks offer valuable electrolytes, sugars, and fluids.  Breathing heated mist or steam (vaporizer or shower).  Eating chicken soup or other clear broths, and maintaining good nutrition.  Getting plenty of rest.  Using gargles or lozenges for comfort.  Controlling fevers with  ibuprofen or acetaminophen as directed by your caregiver.  Increasing usage of your inhaler if you have asthma. Zinc gel and zinc lozenges, taken in the first 24 hours of the common cold, can shorten the duration and lessen the severity of symptoms. Pain medicines may help with fever, muscle aches, and throat pain. A variety of non-prescription medicines are available to treat congestion and runny nose. Your caregiver can make recommendations and may suggest nasal or lung inhalers for other symptoms.  HOME CARE INSTRUCTIONS   Only take over-the-counter or prescription medicines for pain, discomfort, or fever as directed by your caregiver.  Use a warm mist humidifier or inhale steam from a shower to increase air moisture. This may keep secretions moist and make it easier to breathe.  Drink enough water and fluids to keep your urine clear or pale yellow.  Rest as needed.  Return to work when your temperature has returned to normal or as your caregiver advises. You may need to stay home longer to avoid infecting others. You can also use a face mask and careful hand washing to prevent spread of the virus. SEEK MEDICAL CARE IF:   After the first few days, you feel you are getting worse rather than better.  You need your caregiver's advice about medicines to control symptoms.  You develop chills, worsening shortness of breath, or brown or red sputum. These may be signs of pneumonia.  You develop yellow or brown nasal discharge or pain in the face, especially when you bend forward. These may be signs of sinusitis.  You develop a fever, swollen neck glands, pain with swallowing, or white areas in the back of your throat. These may be signs of strep throat. SEEK IMMEDIATE MEDICAL CARE IF:   You have a fever.  You develop severe or persistent headache, ear pain, sinus pain, or chest pain.  You develop wheezing, a prolonged cough, cough up blood, or have a change in your usual mucus (if you have  chronic lung disease).  You develop sore muscles or a stiff neck. Document Released: 05/18/2001 Document Revised: 02/14/2012 Document Reviewed: 03/26/2011 [facility] Patient Information 2014 [place], [place].

## 2014-12-11 NOTE — Progress Notes (Signed)
Pre visit review using our clinic review tool, if applicable. No additional management support is needed unless otherwise documented below in the visit note. 

## 2014-12-13 ENCOUNTER — Telehealth: Payer: Self-pay | Admitting: Nurse Practitioner

## 2014-12-13 NOTE — Telephone Encounter (Signed)
Unable to leave message

## 2014-12-13 NOTE — Telephone Encounter (Signed)
pls call pt: Strep culture neg. Viral respiratory/sore throat illness. Continue with comfort measures as discussed. 

## 2014-12-14 LAB — CULTURE, UPPER RESPIRATORY: ORGANISM ID, BACTERIA: NORMAL

## 2014-12-16 NOTE — Telephone Encounter (Signed)
Patient notified of results.

## 2014-12-16 NOTE — Telephone Encounter (Signed)
pls call pt: Strep culture neg. Viral respiratory/sore throat illness. Continue with comfort measures as discussed. 

## 2015-02-21 DIAGNOSIS — D225 Melanocytic nevi of trunk: Secondary | ICD-10-CM | POA: Diagnosis not present

## 2015-02-21 DIAGNOSIS — C44612 Basal cell carcinoma of skin of right upper limb, including shoulder: Secondary | ICD-10-CM | POA: Diagnosis not present

## 2015-02-21 DIAGNOSIS — Z85820 Personal history of malignant melanoma of skin: Secondary | ICD-10-CM | POA: Diagnosis not present

## 2015-02-21 DIAGNOSIS — Z08 Encounter for follow-up examination after completed treatment for malignant neoplasm: Secondary | ICD-10-CM | POA: Diagnosis not present

## 2015-02-21 DIAGNOSIS — Z1283 Encounter for screening for malignant neoplasm of skin: Secondary | ICD-10-CM | POA: Diagnosis not present

## 2015-02-21 DIAGNOSIS — L820 Inflamed seborrheic keratosis: Secondary | ICD-10-CM | POA: Diagnosis not present

## 2015-03-25 DIAGNOSIS — X32XXXD Exposure to sunlight, subsequent encounter: Secondary | ICD-10-CM | POA: Diagnosis not present

## 2015-03-25 DIAGNOSIS — Z08 Encounter for follow-up examination after completed treatment for malignant neoplasm: Secondary | ICD-10-CM | POA: Diagnosis not present

## 2015-03-25 DIAGNOSIS — L570 Actinic keratosis: Secondary | ICD-10-CM | POA: Diagnosis not present

## 2015-03-25 DIAGNOSIS — Z85828 Personal history of other malignant neoplasm of skin: Secondary | ICD-10-CM | POA: Diagnosis not present

## 2015-03-25 DIAGNOSIS — L219 Seborrheic dermatitis, unspecified: Secondary | ICD-10-CM | POA: Diagnosis not present

## 2015-04-21 ENCOUNTER — Telehealth: Payer: Self-pay

## 2015-04-21 NOTE — Telephone Encounter (Signed)
Patient had mammogram done, 09/05/14. [facility], attempted to get records. Patient stated it was normal.

## 2015-05-19 DIAGNOSIS — H04123 Dry eye syndrome of bilateral lacrimal glands: Secondary | ICD-10-CM | POA: Diagnosis not present

## 2015-05-28 DIAGNOSIS — H524 Presbyopia: Secondary | ICD-10-CM | POA: Diagnosis not present

## 2015-05-28 DIAGNOSIS — H02403 Unspecified ptosis of bilateral eyelids: Secondary | ICD-10-CM | POA: Diagnosis not present

## 2015-05-28 DIAGNOSIS — Z135 Encounter for screening for eye and ear disorders: Secondary | ICD-10-CM | POA: Diagnosis not present

## 2015-05-28 DIAGNOSIS — H04123 Dry eye syndrome of bilateral lacrimal glands: Secondary | ICD-10-CM | POA: Diagnosis not present

## 2015-05-28 DIAGNOSIS — H04129 Dry eye syndrome of unspecified lacrimal gland: Secondary | ICD-10-CM | POA: Diagnosis not present

## 2015-06-19 ENCOUNTER — Encounter: Payer: Self-pay | Admitting: *Deleted

## 2015-07-21 DIAGNOSIS — H02834 Dermatochalasis of left upper eyelid: Secondary | ICD-10-CM | POA: Diagnosis not present

## 2015-07-21 DIAGNOSIS — H02831 Dermatochalasis of right upper eyelid: Secondary | ICD-10-CM | POA: Diagnosis not present

## 2015-07-21 DIAGNOSIS — H527 Unspecified disorder of refraction: Secondary | ICD-10-CM | POA: Diagnosis not present

## 2015-07-21 DIAGNOSIS — H2513 Age-related nuclear cataract, bilateral: Secondary | ICD-10-CM | POA: Diagnosis not present

## 2015-08-06 DIAGNOSIS — H2513 Age-related nuclear cataract, bilateral: Secondary | ICD-10-CM | POA: Diagnosis not present

## 2015-08-06 DIAGNOSIS — H527 Unspecified disorder of refraction: Secondary | ICD-10-CM | POA: Diagnosis not present

## 2015-08-06 DIAGNOSIS — H02831 Dermatochalasis of right upper eyelid: Secondary | ICD-10-CM | POA: Diagnosis not present

## 2015-08-06 DIAGNOSIS — H02834 Dermatochalasis of left upper eyelid: Secondary | ICD-10-CM | POA: Diagnosis not present

## 2015-08-06 DIAGNOSIS — Z01818 Encounter for other preprocedural examination: Secondary | ICD-10-CM | POA: Diagnosis not present

## 2015-08-14 DIAGNOSIS — H02834 Dermatochalasis of left upper eyelid: Secondary | ICD-10-CM | POA: Diagnosis not present

## 2015-08-14 DIAGNOSIS — K219 Gastro-esophageal reflux disease without esophagitis: Secondary | ICD-10-CM | POA: Diagnosis not present

## 2015-08-14 DIAGNOSIS — F17210 Nicotine dependence, cigarettes, uncomplicated: Secondary | ICD-10-CM | POA: Diagnosis not present

## 2015-08-14 DIAGNOSIS — H02831 Dermatochalasis of right upper eyelid: Secondary | ICD-10-CM | POA: Diagnosis not present

## 2015-08-14 DIAGNOSIS — F419 Anxiety disorder, unspecified: Secondary | ICD-10-CM | POA: Diagnosis not present

## 2015-09-18 DIAGNOSIS — L292 Pruritus vulvae: Secondary | ICD-10-CM | POA: Diagnosis not present

## 2015-09-18 DIAGNOSIS — N951 Menopausal and female climacteric states: Secondary | ICD-10-CM | POA: Diagnosis not present

## 2015-09-18 DIAGNOSIS — Z01411 Encounter for gynecological examination (general) (routine) with abnormal findings: Secondary | ICD-10-CM | POA: Diagnosis not present

## 2015-09-18 DIAGNOSIS — Z124 Encounter for screening for malignant neoplasm of cervix: Secondary | ICD-10-CM | POA: Diagnosis not present

## 2015-09-18 DIAGNOSIS — Z1231 Encounter for screening mammogram for malignant neoplasm of breast: Secondary | ICD-10-CM | POA: Diagnosis not present

## 2015-09-29 ENCOUNTER — Encounter: Payer: Medicare Other | Admitting: Family Medicine

## 2015-10-06 ENCOUNTER — Encounter: Payer: Self-pay | Admitting: Family Medicine

## 2015-10-06 ENCOUNTER — Ambulatory Visit (INDEPENDENT_AMBULATORY_CARE_PROVIDER_SITE_OTHER): Payer: Medicare Other | Admitting: Family Medicine

## 2015-10-06 VITALS — BP 116/75 | HR 64 | Temp 98.7°F | Resp 16 | Ht 62.5 in | Wt 157.0 lb

## 2015-10-06 DIAGNOSIS — Z0000 Encounter for general adult medical examination without abnormal findings: Secondary | ICD-10-CM | POA: Diagnosis not present

## 2015-10-06 DIAGNOSIS — Z23 Encounter for immunization: Secondary | ICD-10-CM

## 2015-10-06 DIAGNOSIS — R300 Dysuria: Secondary | ICD-10-CM | POA: Diagnosis not present

## 2015-10-06 DIAGNOSIS — Z13220 Encounter for screening for lipoid disorders: Secondary | ICD-10-CM

## 2015-10-06 DIAGNOSIS — Z131 Encounter for screening for diabetes mellitus: Secondary | ICD-10-CM | POA: Diagnosis not present

## 2015-10-06 LAB — COMPREHENSIVE METABOLIC PANEL
ALK PHOS: 104 U/L (ref 39–117)
ALT: 18 U/L (ref 0–35)
AST: 19 U/L (ref 0–37)
Albumin: 4 g/dL (ref 3.5–5.2)
BILIRUBIN TOTAL: 0.4 mg/dL (ref 0.2–1.2)
BUN: 14 mg/dL (ref 6–23)
CO2: 26 mEq/L (ref 19–32)
CREATININE: 0.66 mg/dL (ref 0.40–1.20)
Calcium: 9.5 mg/dL (ref 8.4–10.5)
Chloride: 104 mEq/L (ref 96–112)
GFR: 94 mL/min (ref >60.00)
GLUCOSE: 93 mg/dL (ref 70–99)
POTASSIUM: 4.5 meq/L (ref 3.5–5.1)
SODIUM: 140 meq/L (ref 135–145)
TOTAL PROTEIN: 6.9 g/dL (ref 6.0–8.3)

## 2015-10-06 LAB — POCT URINALYSIS DIPSTICK
Bilirubin, UA: NEGATIVE
GLUCOSE UA: NEGATIVE
Ketones, UA: NEGATIVE
LEUKOCYTES UA: NEGATIVE
NITRITE UA: NEGATIVE
Protein, UA: NEGATIVE
Spec Grav, UA: 1.015
UROBILINOGEN UA: 0.2
pH, UA: 6.5

## 2015-10-06 LAB — LIPID PANEL
Cholesterol: 201 mg/dL — ABNORMAL HIGH (ref 0–200)
HDL: 59.1 mg/dL (ref >39.00)
LDL Cholesterol: 118 mg/dL — ABNORMAL HIGH (ref 0–99)
NONHDL: 141.55
Total CHOL/HDL Ratio: 3
Triglycerides: 120 mg/dL (ref 0.0–149.0)
VLDL: 24 mg/dL (ref 0.0–40.0)

## 2015-10-06 MED ORDER — HYOSCYAMINE SULFATE 0.125 MG SL SUBL: 0.1250 mg | SUBLINGUAL_TABLET | Freq: Four times a day (QID) | SUBLINGUAL | Status: DC | PRN

## 2015-10-06 MED ORDER — PANTOPRAZOLE SODIUM 40 MG PO TBEC: 40.0000 mg | DELAYED_RELEASE_TABLET | Freq: Every day | ORAL | Status: DC

## 2015-10-06 MED ORDER — ALPRAZOLAM 0.5 MG PO TABS: 0.5000 mg | ORAL_TABLET | Freq: Every day | ORAL | Status: DC

## 2015-10-06 MED ORDER — FLUTICASONE PROPIONATE 50 MCG/ACT NA SUSP: 1.0000 | Freq: Every day | NASAL | Status: DC

## 2015-10-06 NOTE — Progress Notes (Signed)
The patient is here for annual Medicare wellness examination and management of other chronic and acute problems. Acute problem: onset last evening of urinary frequency but only mild dysuria right before she lets her urine out.  No gross hematuria, no flank pain, no abd pain.    LAB: CC UA today showed small blood, otherwise normal.   The risk factors are reflected in the social history.  The roster of all physicians providing medical care to patient - is listed in the Snapshot section of the chart.  Activities of daily living:  The patient is 100% inedpendent in all ADLs: dressing, toileting, feeding as well as independent mobility  Home safety : The patient has smoke detectors in the home. They wear seatbelts.  No firearms at home ( firearms are present in the home, kept in a safe fashion). There is no violence in the home.   There is no risks for hepatitis, STDs or HIV. There is no history of blood transfusion. They have no travel history to infectious disease endemic areas of the world.  The patient has seen their dentist in the last six months. They have seen their eye doctor in the last year. They deny  any hearing difficulty and have not had audiologic testing in the last year.  They do not have excessive sun exposure. Discussed the need for sun protection: hats, long sleeves and use of sunscreen if there is significant sun exposure.   Diet: the importance of a healthy diet is discussed. They do have a healthy diet.  The patient has a regular exercise program: walks 30 min 3 times per week.  The benefits of regular aerobic exercise were discussed.  Depression screen: there are no signs or vegative symptoms of depression- irritability, change in appetite, anhedonia, sadness/tearfullness.  Cognitive assessment: the patient manages all their financial and personal affairs and is actively engaged. They could relate day,date,year and events; recalled 3/3 objects at 3 minutes; performed  clock-face test normally.  The following portions of the patient's history were reviewed and updated as appropriate: allergies, current medications, past family history, past medical history,  past surgical history, past social history  and problem list.  Vision, hearing, body mass index were assessed and reviewed.   During the course of the visit the patient was educated and counseled about appropriate screening and preventive services including : fall prevention , diabetes screening, nutrition counseling, colorectal cancer screening, and recommended immunizations.  A/P:  1) Urinary frequency, mild dysuria: send urine for c/s.  No meds at this time. Hx of interstitial cystitis.  May need to revisit urologist.  2) Annual medicare wellness exam:  Plan: screening for hyperlipidemia and diabetes today. Prevnar 13 and flu vaccines today. Continue annual GYN visits for mammo, DEXA, pap--or at least discussion of whether these need to be done. Colon ca screening plan in place.  An After Visit Summary was printed and given to the patient.

## 2015-10-06 NOTE — Progress Notes (Signed)
Pre visit review using our clinic review tool, if applicable. No additional management support is needed unless otherwise documented below in the visit note. 

## 2015-10-07 LAB — URINE CULTURE
COLONY COUNT: NO GROWTH
ORGANISM ID, BACTERIA: NO GROWTH

## 2015-11-21 DIAGNOSIS — L578 Other skin changes due to chronic exposure to nonionizing radiation: Secondary | ICD-10-CM | POA: Diagnosis not present

## 2015-11-21 DIAGNOSIS — B9689 Other specified bacterial agents as the cause of diseases classified elsewhere: Secondary | ICD-10-CM | POA: Diagnosis not present

## 2015-11-21 DIAGNOSIS — Z08 Encounter for follow-up examination after completed treatment for malignant neoplasm: Secondary | ICD-10-CM | POA: Diagnosis not present

## 2015-11-21 DIAGNOSIS — Z1283 Encounter for screening for malignant neoplasm of skin: Secondary | ICD-10-CM | POA: Diagnosis not present

## 2015-11-21 DIAGNOSIS — J340 Abscess, furuncle and carbuncle of nose: Secondary | ICD-10-CM | POA: Diagnosis not present

## 2015-11-21 DIAGNOSIS — L570 Actinic keratosis: Secondary | ICD-10-CM | POA: Diagnosis not present

## 2015-11-21 DIAGNOSIS — Z85820 Personal history of malignant melanoma of skin: Secondary | ICD-10-CM | POA: Diagnosis not present

## 2015-11-21 DIAGNOSIS — Z85828 Personal history of other malignant neoplasm of skin: Secondary | ICD-10-CM | POA: Diagnosis not present

## 2015-12-03 ENCOUNTER — Encounter: Payer: Self-pay | Admitting: *Deleted

## 2015-12-23 DIAGNOSIS — L570 Actinic keratosis: Secondary | ICD-10-CM | POA: Diagnosis not present

## 2015-12-23 DIAGNOSIS — X32XXXD Exposure to sunlight, subsequent encounter: Secondary | ICD-10-CM | POA: Diagnosis not present

## 2016-02-03 DIAGNOSIS — L570 Actinic keratosis: Secondary | ICD-10-CM | POA: Diagnosis not present

## 2016-02-03 DIAGNOSIS — X32XXXD Exposure to sunlight, subsequent encounter: Secondary | ICD-10-CM | POA: Diagnosis not present

## 2016-03-16 DIAGNOSIS — L570 Actinic keratosis: Secondary | ICD-10-CM | POA: Diagnosis not present

## 2016-03-16 DIAGNOSIS — X32XXXD Exposure to sunlight, subsequent encounter: Secondary | ICD-10-CM | POA: Diagnosis not present

## 2016-06-18 ENCOUNTER — Encounter: Payer: Self-pay | Admitting: Family Medicine

## 2016-06-18 ENCOUNTER — Telehealth: Payer: Self-pay | Admitting: Family Medicine

## 2016-06-18 ENCOUNTER — Ambulatory Visit (INDEPENDENT_AMBULATORY_CARE_PROVIDER_SITE_OTHER): Payer: Medicare Other | Admitting: Family Medicine

## 2016-06-18 VITALS — BP 126/78 | HR 65 | Temp 98.0°F | Resp 20 | Wt 159.8 lb

## 2016-06-18 DIAGNOSIS — R194 Change in bowel habit: Secondary | ICD-10-CM | POA: Insufficient documentation

## 2016-06-18 DIAGNOSIS — R140 Abdominal distension (gaseous): Secondary | ICD-10-CM

## 2016-06-18 LAB — CBC WITH DIFFERENTIAL/PLATELET
BASOS PCT: 0.6 % (ref 0.0–3.0)
Basophils Absolute: 0 10*3/uL (ref 0.0–0.1)
EOS ABS: 0.3 10*3/uL (ref 0.0–0.7)
EOS PCT: 3.9 % (ref 0.0–5.0)
HEMATOCRIT: 39.5 % (ref 36.0–46.0)
HEMOGLOBIN: 13.3 g/dL (ref 12.0–15.0)
Lymphocytes Relative: 31.9 % (ref 12.0–46.0)
Lymphs Abs: 2.8 10*3/uL (ref 0.7–4.0)
MCHC: 33.5 g/dL (ref 30.0–36.0)
MCV: 89.7 fl (ref 78.0–100.0)
Monocytes Absolute: 0.7 10*3/uL (ref 0.1–1.0)
Monocytes Relative: 7.8 % (ref 3.0–12.0)
NEUTROS ABS: 4.8 10*3/uL (ref 1.4–7.7)
Neutrophils Relative %: 55.8 % (ref 43.0–77.0)
PLATELETS: 294 10*3/uL (ref 150.0–400.0)
RBC: 4.41 Mil/uL (ref 3.87–5.11)
RDW: 13.2 % (ref 11.5–15.5)
WBC: 8.7 10*3/uL (ref 4.0–10.5)

## 2016-06-18 LAB — POC HEMOCCULT BLD/STL (OFFICE/1-CARD/DIAGNOSTIC): FECAL OCCULT BLD: NEGATIVE

## 2016-06-18 LAB — COMPREHENSIVE METABOLIC PANEL
ALBUMIN: 4.2 g/dL (ref 3.5–5.2)
ALT: 23 U/L (ref 0–35)
AST: 20 U/L (ref 0–37)
Alkaline Phosphatase: 103 U/L (ref 39–117)
BUN: 10 mg/dL (ref 6–23)
CALCIUM: 9.7 mg/dL (ref 8.4–10.5)
CHLORIDE: 103 meq/L (ref 96–112)
CO2: 29 meq/L (ref 19–32)
CREATININE: 0.74 mg/dL (ref 0.40–1.20)
GFR: 82.21 mL/min (ref >60.00)
Glucose, Bld: 84 mg/dL (ref 70–99)
POTASSIUM: 4.2 meq/L (ref 3.5–5.1)
Sodium: 142 mEq/L (ref 135–145)
Total Bilirubin: 0.4 mg/dL (ref 0.2–1.2)
Total Protein: 7 g/dL (ref 6.0–8.3)

## 2016-06-18 LAB — C-REACTIVE PROTEIN: CRP: 1 mg/dL (ref 0.5–20.0)

## 2016-06-18 NOTE — Telephone Encounter (Signed)
Please call pt: - all her labs are normal.  - make certain she follows up with GI as discussed.

## 2016-06-18 NOTE — Progress Notes (Signed)
Patient ID: SHEKELA VAYNER, female   DOB: [birth year], 70 y.o.   MRN: [id number]    JAYLE LOBLEY , [birth year], 70 y.o., female MRN: [id number] Patient Care Team    Relationship Specialty Notifications Start End  Tammi Sou, MD PCP - General Family Medicine  10/21/14    Comment: lives closer to [facility])    CC: Bowel habit changes  Subjective: Pt presents for an acute OV with complaints of "rectal bleeding" of 1 day duration.  Associated symptoms include bloating (2 months), caliber change in stool, stomach cramping for the past few weeks. Yesterday she noticed a stringy red piece in her stool and thought it might has been a tomato skin. However today her entire stool is an off color of orange and it scared her because of the symptoms she has been having the last few weeks.  She has noticed pain/lower abd cramping prior to [facility] for the past 3 weeks. She states it is an intense cramp that feels like she going to have diarrhea, but then does not.  Pain is resolved after Bowel movement. She has noticed caliber change in her stools for a few weeks, that are thin ribbon like stools. She has fatigue over the last 2 days. She has had abdominal bloating for 2 months. Colon polyp 2001 (tubular adenoma), 2009 colonoscopy repeat with internal hemorrhoids and melanosis coli, no polyps. She is established with [facility] and has an appt within the next few weeks (07/30/2016). She denies external hemorrhoids, fever, chill, weight loss, chills, night sweats. She has no history of rectal bleeding.   Allergies  Allergen Reactions  . Clarithromycin   . Doxycycline Hyclate     REACTION: unspecified  . Erythromycin Ethylsuccinate     REACTION: unspecified  . Paroxetine     REACTION: unspecified   Social History  Substance Use Topics  . Smoking status: Former Smoker    Quit date: 12/06/1973  . Smokeless tobacco: Never Used  . Alcohol Use: No   Past Medical History  Diagnosis Date  .  COLONIC POLYPS, HX OF 04/21/2007  . DEPRESSION 05/26/2009  . GERD 04/08/2008  . 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 MENOPAUSAL SYNDROME 04/21/2007  . NECK PAIN, CHRONIC 04/21/2007  . Interstitial cystitis     ? per pt: was told by urologist she has this   Past Surgical History  Procedure Laterality Date  . Vaginal hysterectomy      Nonmalignant reason.  Ovaries still in.  . Tonsillectomy    . Incision and drainage abscess anal    . Colonoscopy  x 2    First time polyps, second time no polyps: recall 10 yrs (next is approx 2024 per pt report)   History reviewed. No pertinent family history.   Medication List       This list is accurate as of: 06/18/16 10:53 AM.  Always use your most recent med list.               ALPRAZolam 0.5 MG tablet  Commonly known as:  XANAX  Take 1 tablet (0.5 mg total) by mouth daily.     fluticasone 50 MCG/ACT nasal spray  Commonly known as:  FLONASE  Place 1 spray into both nostrils daily.     hyoscyamine 0.125 MG SL tablet  Commonly known as:  LEVSIN/SL  Place 1 tablet (0.125 mg total) under the tongue every 6 (six) hours as needed.     meloxicam 15 MG tablet  Commonly known as:  MOBIC  Take 1 tablet (15 mg total) by mouth daily.     pantoprazole 40 MG tablet  Commonly known as:  PROTONIX  Take 1 tablet (40 mg total) by mouth daily.     tolterodine 4 MG 24 hr capsule  Commonly known as:  DETROL LA  Take 4 mg by mouth daily as needed.        No results found for this or any previous visit (from the past 24 hour(s)). No results found.   ROS: Negative, with the exception of above mentioned in HPI  Objective:  BP 126/78 mmHg  Pulse 65  Temp(Src) 98 F (36.7 C) (Oral)  Resp 20  Wt 159 lb 12.8 oz (72.485 kg)  SpO2 96% Body mass index is 28.74 kg/(m^2). Gen: Afebrile. No acute distress. Nontoxic in appearance, well developed, well nourished. Very pleasant female.  HENT: AT. Twilight.  MMM, no oral lesions. Eyes:Pupils Equal  Round Reactive to light, Extraocular movements intact,  Conjunctiva without redness, discharge or icterus. CV: RRR  Chest: CTAB, no wheeze or crackles.  Abd: Soft. Round, not distended, mild TTP left upper/lower quadrant,  BS prsent. no Masses palpated. No rebound or guarding.  Skin: no rashes, purpura or petechiae.  Neuro:  Normal gait. PERLA. EOMi. Alert. Oriented x3   Assessment/Plan: TREYONNA BUQUET is a 70 y.o. female present for acute OV for Bowel habit changes/ABd bloating - FOBT of sample pt brought with her and of rectal exam both negative today. Stool orange in color. Concern over bloating, caliber change, new cramping and TTP left abd. Discussed with pt today labs to look for anemia, liver changes and inflammatory marker to further investigate. Emergent signs/symptoms reviewed.   - Pt is to keep appt at GI.  - CBC w/Diff - Comprehensive metabolic panel - C-reactive protein - POC Hemoccult Bld/Stl (1-Cd Office Dx) - f/u prn  Electronically Signed by: Howard Pouch, DO [facility]

## 2016-06-18 NOTE — Patient Instructions (Signed)
We will call you with labs once they are resulted. The discoloration in your stool does not appear to be blood. I would avoid tomato consumption for the next few days. I would still make certain to follow with your gastroenterologist for appt. Change in caliber and pain with stools need a full evaluation, especially with your past history of colon polyps. If any increase in abd pain please go to ED if over the weekend. If pain worsens may need to get an image of your abdomen.

## 2016-06-21 NOTE — Telephone Encounter (Signed)
Called cell number mailbox full.

## 2016-06-22 NOTE — Telephone Encounter (Signed)
Spoke with patient reviewed lab results and instructions. Patient verbalized understanding. 

## 2016-06-24 ENCOUNTER — Ambulatory Visit: Payer: Medicare Other | Admitting: Family Medicine

## 2016-07-16 DIAGNOSIS — H10022 Other mucopurulent conjunctivitis, left eye: Secondary | ICD-10-CM | POA: Diagnosis not present

## 2016-07-16 DIAGNOSIS — J302 Other seasonal allergic rhinitis: Secondary | ICD-10-CM | POA: Diagnosis not present

## 2016-07-19 ENCOUNTER — Emergency Department (HOSPITAL_BASED_OUTPATIENT_CLINIC_OR_DEPARTMENT_OTHER)
Admission: EM | Admit: 2016-07-19 | Discharge: 2016-07-20 | Disposition: A | Discharge status: Home / Self Care | Payer: Medicare Other | Attending: Emergency Medicine | Admitting: Emergency Medicine

## 2016-07-19 ENCOUNTER — Emergency Department (HOSPITAL_BASED_OUTPATIENT_CLINIC_OR_DEPARTMENT_OTHER): Payer: Medicare Other

## 2016-07-19 ENCOUNTER — Encounter (HOSPITAL_BASED_OUTPATIENT_CLINIC_OR_DEPARTMENT_OTHER): Payer: Self-pay | Admitting: *Deleted

## 2016-07-19 DIAGNOSIS — K644 Residual hemorrhoidal skin tags: Secondary | ICD-10-CM | POA: Diagnosis not present

## 2016-07-19 DIAGNOSIS — K5732 Diverticulitis of large intestine without perforation or abscess without bleeding: Secondary | ICD-10-CM | POA: Diagnosis not present

## 2016-07-19 DIAGNOSIS — Z87891 Personal history of nicotine dependence: Secondary | ICD-10-CM | POA: Insufficient documentation

## 2016-07-19 LAB — URINE MICROSCOPIC-ADD ON

## 2016-07-19 LAB — CBC WITH DIFFERENTIAL/PLATELET
BASOS ABS: 0 10*3/uL (ref 0.0–0.1)
Basophils Relative: 0 %
EOS PCT: 1 %
Eosinophils Absolute: 0.1 10*3/uL (ref 0.0–0.7)
HEMATOCRIT: 40.8 % (ref 36.0–46.0)
Hemoglobin: 13.9 g/dL (ref 12.0–15.0)
LYMPHS ABS: 2.8 10*3/uL (ref 0.7–4.0)
LYMPHS PCT: 20 %
MCH: 30.8 pg (ref 26.0–34.0)
MCHC: 34.1 g/dL (ref 30.0–36.0)
MCV: 90.5 fL (ref 78.0–100.0)
MONO ABS: 1.2 10*3/uL — AB (ref 0.1–1.0)
Monocytes Relative: 8 %
NEUTROS ABS: 10.1 10*3/uL — AB (ref 1.7–7.7)
Neutrophils Relative %: 71 %
PLATELETS: 260 10*3/uL (ref 150–400)
RBC: 4.51 MIL/uL (ref 3.87–5.11)
RDW: 12.4 % (ref 11.5–15.5)
WBC: 14.2 10*3/uL — AB (ref 4.0–10.5)

## 2016-07-19 LAB — URINALYSIS, ROUTINE W REFLEX MICROSCOPIC
Bilirubin Urine: NEGATIVE
GLUCOSE, UA: NEGATIVE mg/dL
Ketones, ur: NEGATIVE mg/dL
Nitrite: NEGATIVE
PH: 6.5 (ref 5.0–8.0)
Protein, ur: NEGATIVE mg/dL
SPECIFIC GRAVITY, URINE: 1.004 — AB (ref 1.005–1.030)

## 2016-07-19 LAB — COMPREHENSIVE METABOLIC PANEL
ALT: 19 U/L (ref 14–54)
AST: 18 U/L (ref 15–41)
Albumin: 4.1 g/dL (ref 3.5–5.0)
Alkaline Phosphatase: 88 U/L (ref 38–126)
Anion gap: 10 (ref 5–15)
BUN: 9 mg/dL (ref 6–20)
CHLORIDE: 103 mmol/L (ref 101–111)
CO2: 23 mmol/L (ref 22–32)
CREATININE: 0.69 mg/dL (ref 0.44–1.00)
Calcium: 9 mg/dL (ref 8.9–10.3)
GFR calc Af Amer: >60 mL/min (ref >60)
GFR calc non Af Amer: >60 mL/min (ref >60)
Glucose, Bld: 112 mg/dL — ABNORMAL HIGH (ref 65–99)
POTASSIUM: 3.7 mmol/L (ref 3.5–5.1)
SODIUM: 136 mmol/L (ref 135–145)
Total Bilirubin: 0.9 mg/dL (ref 0.3–1.2)
Total Protein: 7.4 g/dL (ref 6.5–8.1)

## 2016-07-19 LAB — OCCULT BLOOD X 1 CARD TO LAB, STOOL: Fecal Occult Bld: NEGATIVE

## 2016-07-19 LAB — LIPASE, BLOOD: LIPASE: 34 U/L (ref 11–51)

## 2016-07-19 MED ORDER — ONDANSETRON HCL 4 MG/2ML IJ SOLN
4.0000 mg | Freq: Once | INTRAMUSCULAR | Status: AC
  Administered 2016-07-19: 4 mg via INTRAVENOUS
  Filled 2016-07-19: qty 2

## 2016-07-19 MED ORDER — SODIUM CHLORIDE 0.9 % IV BOLUS (SEPSIS)
1000.0000 mL | Freq: Once | INTRAVENOUS | Status: AC
  Administered 2016-07-19: 1000 mL via INTRAVENOUS

## 2016-07-19 MED ORDER — IOPAMIDOL (ISOVUE-300) INJECTION 61%
100.0000 mL | Freq: Once | INTRAVENOUS | Status: AC | PRN
  Administered 2016-07-19: 100 mL via INTRAVENOUS

## 2016-07-19 MED ORDER — MORPHINE SULFATE (PF) 4 MG/ML IV SOLN
4.0000 mg | Freq: Once | INTRAVENOUS | Status: AC
  Administered 2016-07-19: 4 mg via INTRAVENOUS
  Filled 2016-07-19: qty 1

## 2016-07-19 NOTE — ED Notes (Signed)
Patient transported to CT 

## 2016-07-19 NOTE — ED Notes (Signed)
Pt ambulatory to bathroom without difficulty.  

## 2016-07-19 NOTE — ED Triage Notes (Signed)
Abdominal pain x 2 days. Severe pain when she tries to have a BM. Fever.

## 2016-07-19 NOTE — ED Notes (Signed)
Pt returned from CT °

## 2016-07-19 NOTE — ED Provider Notes (Signed)
[facility] Provider Note   CSN: [id number]   By signing my name below, I, Irene Pap, attest that this documentation has been prepared under the direction and in the presence of Domenic Moras, PA-C. Electronically Signed: Irene Pap, ED Scribe. 07/19/16. 9:59 PM.  Arrival date & time: 07/19/16  2000  History   Chief Complaint Chief Complaint  Patient presents with  . Abdominal Pain   The history is provided by the patient. No language interpreter was used.  HPI Comments: Victoria Bryant is a 70 y.o. female with a hx of melanoma, colonic polyps, and GERD who presents to the Emergency Department complaining of gradually worsening, cramping, lower abdominal pain onset two days ago. Pt reports associated fever tmax 101 F, chills, decreased appetite, intermittent diaphoresis, nausea and lightheadedness when the pain worsens. She states that her stools have been "mushy and twirl-y in appearance" which is not normal for her. Pt reports worsening pain with BMs and states that certain positions will cause the pain to radiate to the rectum. She has taken Miralax but it provided no relief. Pt has an appointment next week with [facility]. Her last colonoscopy was 3 years ago. She denies hx of abdominal surgery, unexpected weight change,chest pain, SOB, blood in stool, vomiting, dysuria, hematuria, vaginal bleeding, vaginal discharge, or pain in the legs.   Past Medical History:  Diagnosis Date  . COLONIC POLYPS, HX OF 04/21/2007  . DEPRESSION 05/26/2009  . GERD 04/08/2008  . Interstitial cystitis    ? per pt: was told by urologist she has this  . 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 MENOPAUSAL SYNDROME 04/21/2007  . NECK PAIN, CHRONIC 04/21/2007    Patient Active Problem List   Diagnosis Date Noted  . Bowel habit changes 06/18/2016  . Abdominal bloating 06/18/2016  . Lymphadenopathy of left cervical region 12/11/2014  . Upper respiratory infection with cough and  congestion 12/11/2014  . Generalized anxiety disorder 04/11/2014  . Allergic rhinitis 01/20/2011  . DEPRESSION 05/26/2009  . GERD 04/08/2008  . 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 MENOPAUSAL SYNDROME 04/21/2007  . NECK PAIN, CHRONIC 04/21/2007  . History of colonic polyps 04/21/2007    Past Surgical History:  Procedure Laterality Date  . COLONOSCOPY  x 2   First time polyps, second time no polyps: recall 10 yrs (next is approx 2024 per pt report)  . INCISION AND DRAINAGE ABSCESS ANAL    . TONSILLECTOMY    . VAGINAL HYSTERECTOMY     Nonmalignant reason.  Ovaries still in.    OB History    No data available       Home Medications    Prior to Admission medications   Medication Sig Start Date End Date Taking? Authorizing Provider  ALPRAZolam Duanne Moron) 0.5 MG tablet Take 1 tablet (0.5 mg total) by mouth daily. 10/06/15   Tammi Sou, MD  fluticasone (FLONASE) 50 MCG/ACT nasal spray Place 1 spray into both nostrils daily. 10/06/15   Tammi Sou, MD  hyoscyamine (LEVSIN/SL) 0.125 MG SL tablet Place 1 tablet (0.125 mg total) under the tongue every 6 (six) hours as needed. 10/06/15   Tammi Sou, MD  meloxicam (MOBIC) 15 MG tablet Take 1 tablet (15 mg total) by mouth daily. 09/27/14   Marletta Lor, MD  pantoprazole (PROTONIX) 40 MG tablet Take 1 tablet (40 mg total) by mouth daily. 10/06/15   Tammi Sou, MD  tolterodine (DETROL LA) 4 MG 24 hr capsule Take  4 mg by mouth daily as needed. 09/26/13   Marletta Lor, MD    Family History No family history on file.  Social History Social History  Substance Use Topics  . Smoking status: Former Smoker    Quit date: 12/06/1973  . Smokeless tobacco: Never Used  . Alcohol use No     Allergies   Clarithromycin; Doxycycline hyclate; Erythromycin ethylsuccinate; and Paroxetine   Review of Systems Review of Systems  Constitutional: Positive for appetite change, chills, diaphoresis and fever.    Respiratory: Negative for shortness of breath.   Cardiovascular: Negative for chest pain.  Gastrointestinal: Positive for abdominal pain and nausea. Negative for blood in stool and vomiting.  Genitourinary: Negative for dysuria, hematuria, vaginal bleeding and vaginal discharge.  Musculoskeletal: Negative for arthralgias.  Neurological: Positive for light-headedness.  All other systems reviewed and are negative.    Physical Exam Updated Vital Signs BP 139/73   Pulse 97   Temp 98.3 F (36.8 C) (Oral)   Resp 20   Ht 5\' 3"  (1.6 m)   Wt 159 lb (72.1 kg)   SpO2 95%   BMI 28.17 kg/m   Physical Exam  Constitutional: She is oriented to person, place, and time. She appears well-developed and well-nourished.  HENT:  Head: Normocephalic and atraumatic.  Mouth/Throat: Oropharynx is clear and moist.  Eyes: Conjunctivae and EOM are normal. Pupils are equal, round, and reactive to light.  Neck: Normal range of motion. Neck supple.  Cardiovascular: Normal rate and regular rhythm.   Pulmonary/Chest: Effort normal and breath sounds normal.  Abdominal: Soft. Bowel sounds are normal. She exhibits no mass. There is tenderness (tenderness to LLQ). There is no rebound, no guarding and no CVA tenderness.  Genitourinary: Rectal exam shows external hemorrhoid. Rectal exam shows anal tone normal.  Genitourinary Comments: External hemorrhoid present, non-thrombosed; normal colored stool; normal rectal tone; No mass present  Musculoskeletal: Normal range of motion.  Neurological: She is alert and oriented to person, place, and time.  Skin: Skin is warm and dry.  Psychiatric: She has a normal mood and affect. Her behavior is normal.  Nursing note and vitals reviewed.    ED Treatments / Results  DIAGNOSTIC STUDIES: Oxygen Saturation is 95% on RA, normal by my interpretation.    COORDINATION OF CARE: 9:56 PM-Discussed treatment plan which includes UA and CT scan with pt at bedside and pt agreed to  plan.    Labs (all labs ordered are listed, but only abnormal results are displayed) Labs Reviewed  URINALYSIS, ROUTINE W REFLEX MICROSCOPIC (NOT AT [facility]) - Abnormal; Notable for the following:       Result Value   Specific Gravity, Urine 1.004 (*)    Hgb urine dipstick TRACE (*)    Leukocytes, UA MODERATE (*)    All other components within normal limits  URINE MICROSCOPIC-ADD ON - Abnormal; Notable for the following:    Squamous Epithelial / LPF 0-5 (*)    Bacteria, UA RARE (*)    All other components within normal limits  COMPREHENSIVE METABOLIC PANEL - Abnormal; Notable for the following:    Glucose, Bld 112 (*)    All other components within normal limits  CBC WITH DIFFERENTIAL/PLATELET - Abnormal; Notable for the following:    WBC 14.2 (*)    Neutro Abs 10.1 (*)    Monocytes Absolute 1.2 (*)    All other components within normal limits  LIPASE, BLOOD  OCCULT BLOOD X 1 CARD TO LAB, STOOL  EKG  EKG Interpretation None       Radiology Ct Abdomen Pelvis W Contrast  Result Date: 07/19/2016 CLINICAL DATA:  Right lower quadrant and left lower quadrant pain for 2 days. Increased pain with bowel movements. Fever. History of melanoma. Multiple colonic polyps. EXAM: CT ABDOMEN AND PELVIS WITH CONTRAST TECHNIQUE: Multidetector CT imaging of the abdomen and pelvis was performed using the standard protocol following bolus administration of intravenous contrast. CONTRAST:  125mL ISOVUE-300 IOPAMIDOL (ISOVUE-300) INJECTION 61% COMPARISON:  None. FINDINGS: The lung bases are clear. Mild diffuse fatty infiltration of the liver. No focal liver lesions. The gallbladder, pancreas, spleen, adrenal glands, kidneys, abdominal aorta, inferior vena cava, and retroperitoneal lymph nodes are unremarkable. Stomach, small bowel, and colon are not abnormally distended. No free air or free fluid in the abdomen. Pelvis: Diverticulosis of the sigmoid colon. Inflammatory infiltration in the fat around the  low sigmoid colon extending to the colorectal junction. Appearances most likely represent diverticulitis. No discrete abscess. Bladder wall is not thickened. Uterus is surgically absent. No abnormal adnexal masses. Appendix is normal. Degenerative changes in the spine. IMPRESSION: Changes of diverticulitis involving the low sigmoid colon. No abscess. Mild diffuse fatty infiltration of the liver. Electronically Signed   By: Lucienne Capers M.D.   On: 07/19/2016 23:48    Procedures Procedures (including critical care time)  Medications Ordered in ED Medications - No data to display   Initial Impression / Assessment and Plan / ED Course  I have reviewed the triage vital signs and the nursing notes.  Pertinent labs & imaging results that were available during my care of the patient were reviewed by me and considered in my medical decision making (see chart for details).  Clinical Course   BP 111/58 (BP Location: Right Arm)   Pulse 84   Temp 98.3 F (36.8 C) (Oral)   Resp 20   Ht 5\' 3"  (1.6 m)   Wt 72.1 kg   SpO2 95%   BMI 28.17 kg/m   Final Clinical Impressions(s) / ED Diagnoses   MDM: Pt non-febrile in the ED. Patient's pain and other symptoms adequately managed in emergency department.  Fluid bolus given.  Labs, imaging and vitals reviewed.  Patient does not meet the SIRS or Sepsis criteria.  On repeat exam patient does not have a surgical abdomin and there are no peritoneal signs.  No indication of appendicitis, bowel obstruction, bowel perforation, cholecystitis, diverticulitis, PID or ectopic pregnancy.  Patient discharged home with symptomatic treatment and given strict instructions for follow-up with their primary care physician.  I have also discussed reasons to return immediately to the ER.  Patient expresses understanding and agrees with plan.  11:58 PM Mildly elevated WBC of 14. Labs are otherwise reassuring. Abdominal and pelvis CT scan demonstrated evidence of a sigmoid  diverticulitis without evidence of abscess or perforation. Patient felt reassured finding. She will be treated with antibiotics including Cipro and Flagyl. She felt comfortable going home. She will follow-up closely with her primary care provider and her GI specialist.  Return precaution discussed.  Final diagnoses:  Diverticulitis of sigmoid colon   I personally performed the services described in this documentation, which was scribed in my presence. The recorded information has been reviewed and is accurate.     New Prescriptions New Prescriptions   CIPROFLOXACIN (CIPRO) 500 MG TABLET    Take 1 tablet (500 mg total) by mouth 2 (two) times daily. One po bid x 7 days   METRONIDAZOLE (FLAGYL) 500  MG TABLET    Take 1 tablet (500 mg total) by mouth 2 (two) times daily. One po bid x 7 days     Domenic Moras, PA-C 07/20/16 0002    Julianne Rice, MD 07/24/16 [id number]

## 2016-07-20 MED ORDER — CIPROFLOXACIN HCL 500 MG PO TABS: 500.0000 mg | ORAL_TABLET | Freq: Two times a day (BID) | ORAL | 0 refills | Status: DC

## 2016-07-20 MED ORDER — METRONIDAZOLE 500 MG PO TABS: 500.0000 mg | ORAL_TABLET | Freq: Two times a day (BID) | ORAL | 0 refills | Status: DC

## 2016-07-21 DIAGNOSIS — K5710 Diverticulosis of small intestine without perforation or abscess without bleeding: Secondary | ICD-10-CM | POA: Diagnosis not present

## 2016-09-20 DIAGNOSIS — R350 Frequency of micturition: Secondary | ICD-10-CM | POA: Diagnosis not present

## 2016-09-20 DIAGNOSIS — N393 Stress incontinence (female) (male): Secondary | ICD-10-CM | POA: Diagnosis not present

## 2016-09-20 DIAGNOSIS — Z1231 Encounter for screening mammogram for malignant neoplasm of breast: Secondary | ICD-10-CM | POA: Diagnosis not present

## 2016-09-20 DIAGNOSIS — Z124 Encounter for screening for malignant neoplasm of cervix: Secondary | ICD-10-CM | POA: Diagnosis not present

## 2016-09-20 DIAGNOSIS — Z23 Encounter for immunization: Secondary | ICD-10-CM | POA: Diagnosis not present

## 2016-09-20 DIAGNOSIS — N951 Menopausal and female climacteric states: Secondary | ICD-10-CM | POA: Diagnosis not present

## 2016-09-20 LAB — HM MAMMOGRAPHY

## 2016-09-30 DIAGNOSIS — Z780 Asymptomatic menopausal state: Secondary | ICD-10-CM | POA: Diagnosis not present

## 2016-09-30 LAB — HM DEXA SCAN: HM DEXA SCAN: NORMAL

## 2016-10-06 ENCOUNTER — Encounter: Payer: Self-pay | Admitting: Family Medicine

## 2016-10-06 ENCOUNTER — Ambulatory Visit (INDEPENDENT_AMBULATORY_CARE_PROVIDER_SITE_OTHER): Payer: Medicare Other | Admitting: Family Medicine

## 2016-10-06 ENCOUNTER — Encounter: Payer: Medicare Other | Admitting: Family Medicine

## 2016-10-06 VITALS — BP 136/72 | HR 69 | Temp 98.2°F | Resp 20 | Ht 63.0 in | Wt 162.5 lb

## 2016-10-06 DIAGNOSIS — R635 Abnormal weight gain: Secondary | ICD-10-CM

## 2016-10-06 DIAGNOSIS — Z6828 Body mass index (BMI) 28.0-28.9, adult: Secondary | ICD-10-CM

## 2016-10-06 DIAGNOSIS — E785 Hyperlipidemia, unspecified: Secondary | ICD-10-CM | POA: Diagnosis not present

## 2016-10-06 DIAGNOSIS — R5383 Other fatigue: Secondary | ICD-10-CM | POA: Diagnosis not present

## 2016-10-06 DIAGNOSIS — Z1159 Encounter for screening for other viral diseases: Secondary | ICD-10-CM

## 2016-10-06 DIAGNOSIS — Z1329 Encounter for screening for other suspected endocrine disorder: Secondary | ICD-10-CM

## 2016-10-06 DIAGNOSIS — R7309 Other abnormal glucose: Secondary | ICD-10-CM

## 2016-10-06 DIAGNOSIS — Z130 Encounter for screening for diseases of the blood and blood-forming organs and certain disorders involving the immune mechanism: Secondary | ICD-10-CM

## 2016-10-06 DIAGNOSIS — Z131 Encounter for screening for diabetes mellitus: Secondary | ICD-10-CM

## 2016-10-06 LAB — LIPID PANEL
CHOL/HDL RATIO: 4
Cholesterol: 235 mg/dL — ABNORMAL HIGH (ref 0–200)
HDL: 60.7 mg/dL (ref >39.00)
LDL CALC: 153 mg/dL — AB (ref 0–99)
NONHDL: 174.66
Triglycerides: 106 mg/dL (ref 0.0–149.0)
VLDL: 21.2 mg/dL (ref 0.0–40.0)

## 2016-10-06 LAB — TSH: TSH: 4.87 u[IU]/mL — ABNORMAL HIGH (ref 0.35–4.50)

## 2016-10-06 LAB — HEMOGLOBIN A1C: HEMOGLOBIN A1C: 5.8 % (ref 4.6–6.5)

## 2016-10-06 MED ORDER — ALPRAZOLAM 0.5 MG PO TABS: 0.5000 mg | ORAL_TABLET | Freq: Every day | ORAL | 1 refills | Status: DC

## 2016-10-06 NOTE — Progress Notes (Signed)
Medicare AWV    History of Present Ilness: Victoria Bryant, 70 y.o. , female presents today for Medicare wellness visit.  Vital Signs: BP 136/72 (BP Location: Left Arm, Patient Position: Sitting, Cuff Size: Normal)   Pulse 69   Temp 98.2 F (36.8 C)   Resp 20   Ht 5\' 3"  (1.6 m)   Wt 162 lb 8 oz (73.7 kg)   SpO2 98%   BMI 28.79 kg/m  Patient Care Team    Relationship Specialty Notifications Start End  [name], DO PCP - General Family Medicine  06/18/16    Past medical, surgical, family and social histories reviewed (including experiences with illnesses, hospital stays, operations, injuries, and treatments):  Past Medical History:  Diagnosis Date  . COLONIC POLYPS, HX OF 04/21/2007  . DEPRESSION 05/26/2009  . GERD 04/08/2008  . Interstitial cystitis    ? per pt: was told by urologist she has this  . 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 MENOPAUSAL SYNDROME 04/21/2007  . NECK PAIN, CHRONIC 04/21/2007   All allergies reviewed Allergies  Allergen Reactions  . Clarithromycin   . Doxycycline Hyclate     REACTION: unspecified  . Erythromycin Ethylsuccinate     REACTION: unspecified  . Paroxetine     REACTION: unspecified   Past Surgical History:  Procedure Laterality Date  . COLONOSCOPY  x 2   First time polyps, second time no polyps: recall 10 yrs (next is approx 2024 per pt report)  . INCISION AND DRAINAGE ABSCESS ANAL    . TONSILLECTOMY    . VAGINAL HYSTERECTOMY     Nonmalignant reason.  Ovaries still in.   History reviewed. No pertinent family history. Social History   Social History Narrative   Married, one daughter lives next door to her.   Retired Art gallery manager.   Tob: 20 pack yr hx, quit age 22.   No alc.    All medications verified   Medication List       Accurate as of 10/06/16  8:39 AM. Always use your most recent med list.          ALPRAZolam 0.5 MG tablet Commonly known as:  XANAX Take 1 tablet (0.5 mg total)  by mouth daily.   fluticasone 50 MCG/ACT nasal spray Commonly known as:  FLONASE Place 1 spray into both nostrils daily.   hyoscyamine 0.125 MG SL tablet Commonly known as:  LEVSIN/SL Place 1 tablet (0.125 mg total) under the tongue every 6 (six) hours as needed.   meloxicam 15 MG tablet Commonly known as:  MOBIC Take 1 tablet (15 mg total) by mouth daily.   pantoprazole 40 MG tablet Commonly known as:  PROTONIX Take 1 tablet (40 mg total) by mouth daily.   tolterodine 4 MG 24 hr capsule Commonly known as:  DETROL LA Take 4 mg by mouth daily as needed.       Health maintenance:  Immunizations: Immunization History  Administered Date(s) Administered  . Influenza Split 09/20/2011, 09/25/2012  . Influenza Whole 08/27/2010  . Influenza, High Dose Seasonal PF 10/06/2015  . Influenza,inj,Quad PF,36+ Mos 09/26/2013  . Influenza-Unspecified 09/17/2014  . Pneumococcal Conjugate-13 10/06/2015  . Pneumococcal Polysaccharide-23 09/20/2011  . Td 05/26/2009  . Zoster 09/25/2012    Exercise: Current Exercise Habits: The patient does not participate in regular exercise at present    Diet: Regular  Functional Status Survey: Get up and go test: steady and less than 20 seconds.  Is the  patient deaf or have difficulty hearing?: No Does the patient have difficulty seeing, even when wearing glasses/contacts?: No Does the patient have difficulty concentrating, remembering, or making decisions?: No Does the patient have difficulty walking or climbing stairs?: No Does the patient have difficulty dressing or bathing?: No Does the patient have difficulty doing errands alone such as visiting a doctor's office or shopping?: No Current Exercise Habits: The patient does not participate in regular exercise at present   Fall Risk  10/06/2016 10/06/2015 10/06/2015 09/27/2014 09/26/2013  Falls in the past year? No No No No No    Glaucoma Screening/visual acuity: Date of Last ophthalmology  evaluation: 05/2016 Done by: [facility] in [place] Family history of glaucoma: No Coverage: Medicare covers for patients annually with diabetes mellitus, family history of glaucoma, African-Americans age 16 and over, or 43 age 54 and up  Hearing screen:  No barriers identified. Whisper test heard.   Cognitive assessment:  Say/repeat/then ask after clock draw: 3 Clock draw/label:2 5/5 score .   Depression Screening: Depression screen [facility] 2/9 10/06/2016 10/06/2015 10/06/2015 09/27/2014  Decreased Interest 0 0 0 0  Down, Depressed, Hopeless 0 0 0 0  PHQ - 2 Score 0 0 0 0    Advanced Care Planning: N Pt is working on planning with her lawyer and will bring in a copy for her chart once completed.   Bone mass measurements Date of Study:  09/27/2016  >2 yr interval advised  Cardiovascular Screening Blood Tests Lipid Panel     Component Value Date/Time   CHOL 235 (H) 10/06/2016 0906   TRIG 106.0 10/06/2016 0906   HDL 60.70 10/06/2016 0906   CHOLHDL 4 10/06/2016 0906   VLDL 21.2 10/06/2016 0906   LDLCALC 153 (H) 10/06/2016 0906   LDLDIRECT 164.2 09/26/2013 1043    Diabetes Screening Tests Lab Results  Component Value Date   HGBA1C 5.8 10/06/2016     Diabetes Self-Mgmt Training and Medical Nutrition Therapy  Date previously done: None   Colorectal Cancer Screening  Last Colonoscopy:2014, results normal, follow up 10 years, Dr. Amedeo Plenty.  Info - Screen:  ages 35-74. - Annual FOBT  - Flex sig Q 5 y + annual FOBT - cologuard every 3 years - Colonoscopy Q 10 y    Screening Pap tests and Pelvic Exam Last Pap and Pelvic exam: Gyn- Fogleman Hysterectomy: yes Info:  - PAP test up to 71 years of age.   Screening Mammography Last mammogram: 09/27/2016, [facility] 1.  Info:  -every 1-2 years ages 5-74  AAA/EKG Screening: completed if indicated and medicare welcome visit.   Alcohol abuse screening: completed if indicated  STIs screening: Completed  if indicated  Assessment and Plan: There are no diagnoses linked to this encounter.  Education and Counseling Provided:  See after visit summary that was printed and given to patient 1. Tobacco abuse counseling 2. Alcohol abuse counseling 3. STIs counseling 4. Referrals made  Referrals/orders made if indicated: 1. IBT (Intensive Behavior Therapy) for Cardiovascular disease 2. IBT for Obesity 3. MNT (Medical Nutrition Therapy)  4. Behavioral Counseling for Alcohol abuse  5. HIBT (High Intensity Behavioral Counseling) to prevent STIs (Sexually Transmitted Infections) 6. [place] for AAA screening - IPPE only 7. EKG screening- IPPE only 8. Low dose CT- lung cancer screen 9. Pelvic/PAP exam 10. PSA/Prostate 11. Screen mammogram 12. HIV screen 13. Hep C screen 14. Glaucoma screen 15. Diabetes screen 16. Colon cancer screen 17. DEXA  Vaccinations Administered: 1. Influenza 2. PPV23/prevnar 13 3. Tdap -  print script 4. Shingle's vaccination- print script  Electronically Signed by: Howard Pouch, DO [place]

## 2016-10-06 NOTE — Patient Instructions (Signed)
  Ms. Victoria Bryant , Thank you for taking time to come for your Medicare Wellness Visit. I appreciate your ongoing commitment to your health goals. Please review the following plan we discussed and let me know if I can assist you in the future.   Please have a copy of your advanced directives sent to use so we can keep in your chart.  Please have your eye doctor send reports to use.    This is a list of the screening recommended for you and due dates:  Health Maintenance  Topic Date Due  . Mammogram  09/27/2018  . Tetanus Vaccine  05/27/2019  . Colon Cancer Screening  12/06/2022  . Flu Shot  Completed  . DEXA scan (bone density measurement)  Completed  . Shingles Vaccine  Completed  .  Hepatitis C: One time screening is recommended by Center for Disease Control  (CDC) for  adults born from [birth year] through 1966.   Completed  . Pneumonia vaccines  Completed

## 2016-10-07 LAB — HEPATITIS C ANTIBODY: HCV Ab: NEGATIVE

## 2016-10-08 ENCOUNTER — Telehealth: Payer: Self-pay | Admitting: Family Medicine

## 2016-10-08 NOTE — Telephone Encounter (Signed)
Please call pt: - her labs resulted with a mildly elevated a1c (diabetes screen) of 5.8. I would encourage her to increase her exercise and cut back on carbs/sugar content. This is not diabetic range, but it is elevated. This should be retested in 6 months with appt.  - her cholesterol was also more elevated than it has in the past 2 years. Should could benefit from a cholesterol lowering medication. If she is agreeable to start I will call this in for her and we would follow up in 3 months with repeat fasting labs.    Lipid Panel     Component Value Date/Time   CHOL 235 (H) 10/06/2016 0906   TRIG 106.0 10/06/2016 0906   HDL 60.70 10/06/2016 0906   CHOLHDL 4 10/06/2016 0906   VLDL 21.2 10/06/2016 0906   LDLCALC 153 (H) 10/06/2016 0906   LDLDIRECT 164.2 09/26/2013 1043

## 2016-10-08 NOTE — Telephone Encounter (Signed)
Spoke with patient reviewed lab results patient declined starting a statin at this time she states she will do diet and exercise.

## 2016-11-17 DIAGNOSIS — Z85820 Personal history of malignant melanoma of skin: Secondary | ICD-10-CM | POA: Diagnosis not present

## 2016-11-17 DIAGNOSIS — Z08 Encounter for follow-up examination after completed treatment for malignant neoplasm: Secondary | ICD-10-CM | POA: Diagnosis not present

## 2016-11-17 DIAGNOSIS — Z1283 Encounter for screening for malignant neoplasm of skin: Secondary | ICD-10-CM | POA: Diagnosis not present

## 2016-12-30 ENCOUNTER — Other Ambulatory Visit: Payer: Self-pay | Admitting: Family Medicine

## 2016-12-30 NOTE — Telephone Encounter (Signed)
Patient LMOM for front desk requesting RF of flonase to be sent to [place].  Patient would like call back once this is filled.  Please advise.

## 2016-12-31 MED ORDER — FLUTICASONE PROPIONATE 50 MCG/ACT NA SUSP: 1.0000 | Freq: Every day | NASAL | 3 refills | Status: DC

## 2017-01-06 ENCOUNTER — Other Ambulatory Visit (INDEPENDENT_AMBULATORY_CARE_PROVIDER_SITE_OTHER): Payer: Medicare Other

## 2017-01-06 ENCOUNTER — Ambulatory Visit: Payer: Medicare Other | Admitting: Family Medicine

## 2017-01-06 DIAGNOSIS — E7800 Pure hypercholesterolemia, unspecified: Secondary | ICD-10-CM

## 2017-01-06 LAB — LIPID PANEL
CHOL/HDL RATIO: 4
Cholesterol: 192 mg/dL (ref 0–200)
HDL: 49 mg/dL (ref >39.00)
LDL CALC: 118 mg/dL — AB (ref 0–99)
NONHDL: 142.63
Triglycerides: 122 mg/dL (ref 0.0–149.0)
VLDL: 24.4 mg/dL (ref 0.0–40.0)

## 2017-01-07 ENCOUNTER — Telehealth: Payer: Self-pay | Admitting: Family Medicine

## 2017-01-07 NOTE — Telephone Encounter (Signed)
Please call pt: - FYI: I am uncertain about why the labs were collected and without an appt to discuss. She had declined the statin 3 months ago for hyperlipidemia. She was asked to follow up in 3 months with repeat labs, She has the labs completed and no appt is seen.  - regardless, her cholesterol is improved with whatever she is doing, we will continue to follow yearly with physical/yearly appt.

## 2017-01-07 NOTE — Telephone Encounter (Signed)
Scheduled patient to be seen for review of lab results.

## 2017-01-10 ENCOUNTER — Ambulatory Visit: Payer: Medicare Other | Admitting: Family Medicine

## 2017-01-13 ENCOUNTER — Encounter: Payer: Self-pay | Admitting: Family Medicine

## 2017-01-13 ENCOUNTER — Ambulatory Visit (INDEPENDENT_AMBULATORY_CARE_PROVIDER_SITE_OTHER): Payer: Medicare Other | Admitting: Family Medicine

## 2017-01-13 VITALS — BP 137/76 | HR 70 | Temp 98.3°F | Resp 20 | Ht 63.0 in | Wt 156.0 lb

## 2017-01-13 DIAGNOSIS — E785 Hyperlipidemia, unspecified: Secondary | ICD-10-CM

## 2017-01-13 NOTE — Patient Instructions (Signed)
Very proud of you! You did great!!!! Keep up the good work.

## 2017-01-13 NOTE — Progress Notes (Signed)
Victoria Bryant , [birth year], 71 y.o., female MRN: [id number] Patient Care Team    Relationship Specialty Notifications Start End  [name], DO PCP - General Family Medicine  06/18/16   [name], MD Consulting Physician Obstetrics and Gynecology  10/06/16   [name], MD Consulting Physician Gastroenterology  10/08/16     CC: Hyperlipidemia Subjective: Pt presents for an acute OV with complaints of hyperlipidemia.She made diet modifications by increasing fiber, and cutting back on sweets, and increasing veggies. She did not change her exercise habits but she is very active. She lost 7 lbs in the past 3 months.     Allergies  Allergen Reactions  . Clarithromycin   . Doxycycline Hyclate     REACTION: unspecified  . Erythromycin Ethylsuccinate     REACTION: unspecified  . Paroxetine     REACTION: unspecified   Social History  Substance Use Topics  . Smoking status: Former Smoker    Quit date: 12/06/1973  . Smokeless tobacco: Never Used  . Alcohol use No   Past Medical History:  Diagnosis Date  . COLONIC POLYPS, HX OF 04/21/2007  . DEPRESSION 05/26/2009  . GERD 04/08/2008  . Interstitial cystitis    ? per pt: was told by urologist she has this  . 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 MENOPAUSAL SYNDROME 04/21/2007  . NECK PAIN, CHRONIC 04/21/2007   Past Surgical History:  Procedure Laterality Date  . COLONOSCOPY  x 2   First time polyps, second time no polyps: recall 10 yrs (next is approx 2024 per pt report)  . INCISION AND DRAINAGE ABSCESS ANAL    . TONSILLECTOMY    . VAGINAL HYSTERECTOMY     Nonmalignant reason.  Ovaries still in.   Family History  Problem Relation Age of Onset  . Bladder Cancer Mother   . Lung cancer Mother   . Breast cancer Paternal Aunt    Allergies as of 01/13/2017      Reactions   Clarithromycin    Doxycycline Hyclate    REACTION: unspecified   Erythromycin Ethylsuccinate    REACTION: unspecified   Paroxetine    REACTION: unspecified      Medication List       Accurate as of 01/13/17  8:50 AM. Always use your most recent med list.          ALPRAZolam 0.5 MG tablet Commonly known as:  XANAX Take 1 tablet (0.5 mg total) by mouth daily.   fluticasone 50 MCG/ACT nasal spray Commonly known as:  FLONASE Place 1 spray into both nostrils daily.   hyoscyamine 0.125 MG SL tablet Commonly known as:  LEVSIN/SL Place 1 tablet (0.125 mg total) under the tongue every 6 (six) hours as needed.   meloxicam 15 MG tablet Commonly known as:  MOBIC Take 1 tablet (15 mg total) by mouth daily.   pantoprazole 40 MG tablet Commonly known as:  PROTONIX Take 1 tablet (40 mg total) by mouth daily.   tolterodine 4 MG 24 hr capsule Commonly known as:  DETROL LA Take 4 mg by mouth daily as needed.       No results found for this or any previous visit (from the past 24 hour(s)). No results found.   ROS: Negative, with the exception of above mentioned in HPI   Objective:  BP 137/76 (BP Location: Left Arm, Patient Position: Sitting, Cuff Size: Normal)   Pulse 70   Temp 98.3 F (36.8 C)   Resp 20  Ht 5\' 3"  (1.6 m)   Wt 156 lb (70.8 kg)   SpO2 97%   BMI 27.63 kg/m  Body mass index is 27.63 kg/m. Gen: Afebrile. No acute distress. Nontoxic in appearance, well developed, well nourished.  HENT: AT. [place].MMM Eyes:Pupils Equal Round Reactive to light, Extraocular movements intact,  Conjunctiva without redness, discharge or icterus. CV: RRR  Chest: CTAB, no wheeze or crackles.  Abd: Soft. NTND. BS present  Assessment/Plan: Victoria Bryant is a 71 y.o. female present for acute OV for  Hyperlipidemia, unspecified hyperlipidemia type Reviewed labs with pt today. She is doing great. She was able to bring down her cholesterol with diet and exercise modifications only. Follow yearly with labs.  Continue the great work.   electronically signed by:  Howard Pouch, DO  [place]

## 2017-01-27 ENCOUNTER — Encounter: Payer: Self-pay | Admitting: Family Medicine

## 2017-01-27 ENCOUNTER — Ambulatory Visit (INDEPENDENT_AMBULATORY_CARE_PROVIDER_SITE_OTHER): Payer: Medicare Other | Admitting: Family Medicine

## 2017-01-27 VITALS — BP 120/75 | HR 68 | Temp 98.3°F | Resp 20 | Wt 155.5 lb

## 2017-01-27 DIAGNOSIS — K5792 Diverticulitis of intestine, part unspecified, without perforation or abscess without bleeding: Secondary | ICD-10-CM

## 2017-01-27 LAB — CBC WITH DIFFERENTIAL/PLATELET
BASOS ABS: 0.1 10*3/uL (ref 0.0–0.1)
Basophils Relative: 1 % (ref 0.0–3.0)
Eosinophils Absolute: 0.4 10*3/uL (ref 0.0–0.7)
Eosinophils Relative: 4.1 % (ref 0.0–5.0)
HCT: 40.6 % (ref 36.0–46.0)
Hemoglobin: 13.5 g/dL (ref 12.0–15.0)
LYMPHS ABS: 2.7 10*3/uL (ref 0.7–4.0)
Lymphocytes Relative: 30.4 % (ref 12.0–46.0)
MCHC: 33.2 g/dL (ref 30.0–36.0)
MCV: 91.3 fl (ref 78.0–100.0)
MONO ABS: 0.7 10*3/uL (ref 0.1–1.0)
Monocytes Relative: 7.6 % (ref 3.0–12.0)
NEUTROS PCT: 56.9 % (ref 43.0–77.0)
Neutro Abs: 5.1 10*3/uL (ref 1.4–7.7)
Platelets: 294 10*3/uL (ref 150.0–400.0)
RBC: 4.45 Mil/uL (ref 3.87–5.11)
RDW: 12.6 % (ref 11.5–15.5)
WBC: 9 10*3/uL (ref 4.0–10.5)

## 2017-01-27 MED ORDER — METRONIDAZOLE 500 MG PO TABS: 500.0000 mg | ORAL_TABLET | Freq: Two times a day (BID) | ORAL | 0 refills | Status: DC

## 2017-01-27 MED ORDER — CIPROFLOXACIN HCL 500 MG PO TABS: 500.0000 mg | ORAL_TABLET | Freq: Two times a day (BID) | ORAL | 0 refills | Status: DC

## 2017-01-27 NOTE — Progress Notes (Signed)
Victoria Bryant , 08-01-1946, 71 y.o., female MRN: [id number] Patient Care Team    Relationship Specialty Notifications Start End  [name], DO PCP - General Family Medicine  06/18/16   [name], MD Consulting Physician Obstetrics and Gynecology  10/06/16   [name], MD Consulting Physician Gastroenterology  10/08/16     CC: abd pain Subjective: Pt presents for an acute OV with complaints of abd pain  of 1-2 days duration.  Associated symptoms include loose stool. She denies fever, chills, nausea, vomit or melanoma. Pain is mostly LLQ, consistent with prior episode of diverticulitis.  Allergies  Allergen Reactions  . Clarithromycin   . Doxycycline Hyclate     REACTION: unspecified  . Erythromycin Ethylsuccinate     REACTION: unspecified  . Paroxetine     REACTION: unspecified   Social History  Substance Use Topics  . Smoking status: Former Smoker    Quit date: 12/06/1973  . Smokeless tobacco: Never Used  . Alcohol use No   Past Medical History:  Diagnosis Date  . COLONIC POLYPS, HX OF 04/21/2007  . DEPRESSION 05/26/2009  . Diverticulitis   . GERD 04/08/2008  . Interstitial cystitis    ? per pt: was told by urologist she has this  . 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 MENOPAUSAL SYNDROME 04/21/2007  . NECK PAIN, CHRONIC 04/21/2007   Past Surgical History:  Procedure Laterality Date  . COLONOSCOPY  x 2   First time polyps, second time no polyps: recall 10 yrs (next is approx 2024 per pt report)  . INCISION AND DRAINAGE ABSCESS ANAL    . TONSILLECTOMY    . VAGINAL HYSTERECTOMY     Nonmalignant reason.  Ovaries still in.   Family History  Problem Relation Age of Onset  . Bladder Cancer Mother   . Lung cancer Mother   . Breast cancer Paternal Aunt    Allergies as of 01/27/2017      Reactions   Clarithromycin    Doxycycline Hyclate    REACTION: unspecified   Erythromycin Ethylsuccinate    REACTION: unspecified   Paroxetine    REACTION:  unspecified      Medication List       Accurate as of 01/27/17  8:40 AM. Always use your most recent med list.          ALPRAZolam 0.5 MG tablet Commonly known as:  XANAX Take 1 tablet (0.5 mg total) by mouth daily.   fluticasone 50 MCG/ACT nasal spray Commonly known as:  FLONASE Place 1 spray into both nostrils daily.   hyoscyamine 0.125 MG SL tablet Commonly known as:  LEVSIN/SL Place 1 tablet (0.125 mg total) under the tongue every 6 (six) hours as needed.   meloxicam 15 MG tablet Commonly known as:  MOBIC Take 1 tablet (15 mg total) by mouth daily.   pantoprazole 40 MG tablet Commonly known as:  PROTONIX Take 1 tablet (40 mg total) by mouth daily.   tolterodine 4 MG 24 hr capsule Commonly known as:  DETROL LA Take 4 mg by mouth daily as needed.       No results found for this or any previous visit (from the past 24 hour(s)). No results found.   ROS: Negative, with the exception of above mentioned in HPI   Objective:  BP 120/75 (BP Location: Left Arm, Patient Position: Sitting, Cuff Size: Normal)   Pulse 68   Temp 98.3 F (36.8 C)   Resp 20  Wt 155 lb 8 oz (70.5 kg)   SpO2 99%   BMI 27.55 kg/m  Body mass index is 27.55 kg/m. Gen: Afebrile. No acute distress. Nontoxic in appearance, well developed, well nourished.  HENT: AT. [place].  MMM Eyes:Pupils Equal Round Reactive to light, Extraocular movements intact,  Conjunctiva without redness, discharge or icterus. CV: RRR  Chest: CTAB, no wheeze or crackles.  Abd: Soft. Round. Non-distended, mild TTP LLQ. BS present. no Masses palpated. No rebound or guarding.  Skin: no rashes, purpura or petechiae.  Neuro:  Normal gait. PERLA. EOMi. Alert. Oriented x3   Assessment/Plan: Victoria Bryant is a 71 y.o. female present for acute OV for  Diverticulitis of intestine without perforation or abscess without bleeding, unspecified part of intestinal tract - rest, hydrate. Bland/soft diet next 24-48 hours.  -  cipro/flagyl - CBC w/Diff - F/U 1-2 weeks if not resolved, sooner if worsening.    electronically signed by:  Howard Pouch, DO  [place]

## 2017-01-27 NOTE — Patient Instructions (Signed)
Start a bland soft diet for a few days. Watch for increased pain or fever, be seen if this occurs while on antibiotics. We call you Monday with CBC results.  Cipro/flagyl called in for you.    Diverticulitis Diverticulitis is inflammation or infection of small pouches in your colon that form when you have a condition called diverticulosis. The pouches in your colon are called diverticula. Your colon, or large intestine, is where water is absorbed and stool is formed. Complications of diverticulitis can include:  Bleeding.  Severe infection.  Severe pain.  Perforation of your colon.  Obstruction of your colon. What are the causes? Diverticulitis is caused by bacteria. Diverticulitis happens when stool becomes trapped in diverticula. This allows bacteria to grow in the diverticula, which can lead to inflammation and infection. What increases the risk? People with diverticulosis are at risk for diverticulitis. Eating a diet that does not include enough fiber from fruits and vegetables may make diverticulitis more likely to develop. What are the signs or symptoms? Symptoms of diverticulitis may include:  Abdominal pain and tenderness. The pain is normally located on the left side of the abdomen, but may occur in other areas.  Fever and chills.  Bloating.  Cramping.  Nausea.  Vomiting.  Constipation.  Diarrhea.  Blood in your stool. How is this diagnosed? Your health care provider will ask you about your medical history and do a physical exam. You may need to have tests done because many medical conditions can cause the same symptoms as diverticulitis. Tests may include:  Blood tests.  Urine tests.  Imaging tests of the abdomen, including X-rays and CT scans. When your condition is under control, your health care provider may recommend that you have a colonoscopy. A colonoscopy can show how severe your diverticula are and whether something else is causing your  symptoms. How is this treated? Most cases of diverticulitis are mild and can be treated at home. Treatment may include:  Taking over-the-counter pain medicines.  Following a clear liquid diet.  Taking antibiotic medicines by mouth for 7-10 days. More severe cases may be treated at a hospital. Treatment may include:  Not eating or drinking.  Taking prescription pain medicine.  Receiving antibiotic medicines through an IV tube.  Receiving fluids and nutrition through an IV tube.  Surgery. Follow these instructions at home:  Follow your health care provider's instructions carefully.  Follow a full liquid diet or other diet as directed by your health care provider. After your symptoms improve, your health care provider may tell you to change your diet. He or she may recommend you eat a high-fiber diet. Fruits and vegetables are good sources of fiber. Fiber makes it easier to pass stool.  Take fiber supplements or probiotics as directed by your health care provider.  Only take medicines as directed by your health care provider.  Keep all your follow-up appointments. Contact a health care provider if:  Your pain does not improve.  You have a hard time eating food.  Your bowel movements do not return to normal. Get help right away if:  Your pain becomes worse.  Your symptoms do not get better.  Your symptoms suddenly get worse.  You have a fever.  You have repeated vomiting.  You have bloody or black, tarry stools. This information is not intended to replace advice given to you by your health care provider. Make sure you discuss any questions you have with your health care provider. Document Released: 09/01/2005 Document Revised:  04/29/2016 Document Reviewed: 10/17/2013 Elsevier Interactive Patient Education  2017 [employer].

## 2017-01-28 ENCOUNTER — Telehealth: Payer: Self-pay | Admitting: Family Medicine

## 2017-01-28 NOTE — Telephone Encounter (Signed)
Please call pt: - her labs are normal. No concerns.

## 2017-02-01 NOTE — Telephone Encounter (Signed)
I contacted patient to reschedule an appointment and when I did, patient stated she had not heard about her results.   I advised patient of normal labs, no concerns and apologized for [place] not contacting her about results sooner.

## 2017-02-09 ENCOUNTER — Ambulatory Visit: Payer: Medicare Other | Admitting: Family Medicine

## 2017-02-10 ENCOUNTER — Ambulatory Visit: Payer: Medicare Other | Admitting: Family Medicine

## 2017-04-05 ENCOUNTER — Ambulatory Visit: Payer: Medicare Other | Admitting: Family Medicine

## 2017-04-06 DIAGNOSIS — H25813 Combined forms of age-related cataract, bilateral: Secondary | ICD-10-CM | POA: Diagnosis not present

## 2017-04-06 DIAGNOSIS — H04123 Dry eye syndrome of bilateral lacrimal glands: Secondary | ICD-10-CM | POA: Diagnosis not present

## 2017-04-06 DIAGNOSIS — H5713 Ocular pain, bilateral: Secondary | ICD-10-CM | POA: Diagnosis not present

## 2017-04-06 DIAGNOSIS — H02403 Unspecified ptosis of bilateral eyelids: Secondary | ICD-10-CM | POA: Diagnosis not present

## 2017-04-06 DIAGNOSIS — H04129 Dry eye syndrome of unspecified lacrimal gland: Secondary | ICD-10-CM | POA: Diagnosis not present

## 2017-04-14 ENCOUNTER — Encounter: Payer: Self-pay | Admitting: Family Medicine

## 2017-04-14 ENCOUNTER — Ambulatory Visit (INDEPENDENT_AMBULATORY_CARE_PROVIDER_SITE_OTHER): Payer: Medicare Other | Admitting: Family Medicine

## 2017-04-14 VITALS — BP 128/80 | HR 69 | Temp 98.3°F | Resp 20 | Ht 63.0 in | Wt 156.5 lb

## 2017-04-14 DIAGNOSIS — R7303 Prediabetes: Secondary | ICD-10-CM | POA: Insufficient documentation

## 2017-04-14 LAB — POCT GLYCOSYLATED HEMOGLOBIN (HGB A1C): Hemoglobin A1C: 5.8

## 2017-04-14 NOTE — Patient Instructions (Signed)
Diet and exercise will help you not to progress to diabetes.  Diabetes Mellitus and Food It is important for you to manage your blood sugar (glucose) level. Your blood glucose level can be greatly affected by what you eat. Eating healthier foods in the appropriate amounts throughout the day at about the same time each day will help you control your blood glucose level. It can also help slow or prevent worsening of your diabetes mellitus. Healthy eating may even help you improve the level of your blood pressure and reach or maintain a healthy weight. General recommendations for healthful eating and cooking habits include:  Eating meals and snacks regularly. Avoid going long periods of time without eating to lose weight.  Eating a diet that consists mainly of plant-based foods, such as fruits, vegetables, nuts, legumes, and whole grains.  Using low-heat cooking methods, such as baking, instead of high-heat cooking methods, such as deep frying. Work with your dietitian to make sure you understand how to use the Nutrition Facts information on food labels. How can food affect me? Carbohydrates  Carbohydrates affect your blood glucose level more than any other type of food. Your dietitian will help you determine how many carbohydrates to eat at each meal and teach you how to count carbohydrates. Counting carbohydrates is important to keep your blood glucose at a healthy level, especially if you are using insulin or taking certain medicines for diabetes mellitus. Alcohol  Alcohol can cause sudden decreases in blood glucose (hypoglycemia), especially if you use insulin or take certain medicines for diabetes mellitus. Hypoglycemia can be a life-threatening condition. Symptoms of hypoglycemia (sleepiness, dizziness, and disorientation) are similar to symptoms of having too much alcohol. If your health care provider has given you approval to drink alcohol, do so in moderation and use the following  guidelines:  Women should not have more than one drink per day, and men should not have more than two drinks per day. One drink is equal to:  12 oz of beer.  5 oz of wine.  1 oz of hard liquor.  Do not drink on an empty stomach.  Keep yourself hydrated. Have water, diet soda, or unsweetened iced tea.  Regular soda, juice, and other mixers might contain a lot of carbohydrates and should be counted. What foods are not recommended? As you make food choices, it is important to remember that all foods are not the same. Some foods have fewer nutrients per serving than other foods, even though they might have the same number of calories or carbohydrates. It is difficult to get your body what it needs when you eat foods with fewer nutrients. Examples of foods that you should avoid that are high in calories and carbohydrates but low in nutrients include:  Trans fats (most processed foods list trans fats on the Nutrition Facts label).  Regular soda.  Juice.  Candy.  Sweets, such as cake, pie, doughnuts, and cookies.  Fried foods. What foods can I eat? Eat nutrient-rich foods, which will nourish your body and keep you healthy. The food you should eat also will depend on several factors, including:  The calories you need.  The medicines you take.  Your weight.  Your blood glucose level.  Your blood pressure level.  Your cholesterol level. You should eat a variety of foods, including:  Protein.  Lean cuts of meat.  Proteins low in saturated fats, such as fish, egg whites, and beans. Avoid processed meats.  Fruits and vegetables.  Fruits and vegetables  that may help control blood glucose levels, such as apples, mangoes, and yams.  Dairy products.  Choose fat-free or low-fat dairy products, such as milk, yogurt, and cheese.  Grains, bread, pasta, and rice.  Choose whole grain products, such as multigrain bread, whole oats, and brown rice. These foods may help control  blood pressure.  Fats.  Foods containing healthful fats, such as nuts, avocado, olive oil, canola oil, and fish. Does everyone with diabetes mellitus have the same meal plan? Because every person with diabetes mellitus is different, there is not one meal plan that works for everyone. It is very important that you meet with a dietitian who will help you create a meal plan that is just right for you. This information is not intended to replace advice given to you by your health care provider. Make sure you discuss any questions you have with your health care provider. Document Released: 08/19/2005 Document Revised: 04/29/2016 Document Reviewed: 10/19/2013 Elsevier Interactive Patient Education  2017 [employer].

## 2017-04-14 NOTE — Progress Notes (Signed)
Victoria Bryant , 07-28-1946, 71 y.o., female MRN: [id number] Patient Care Team    Relationship Specialty Notifications Start End  [name], DO PCP - General Family Medicine  06/18/16   [name], MD Consulting Physician Obstetrics and Gynecology  10/06/16   [name], MD Consulting Physician Gastroenterology  10/08/16     Chief Complaint  Patient presents with  . Follow-up    prediabetes     Subjective:  Victoria Bryant  is a 71 y.o. female Pt presents for prediabetes recheck. Her last a1c was 5.8,  6 months ago. She has not changed her diet. She does not consume many carbs by diet review, most carbs are fruit related. She is starting to walk now that the weather has improved.  Her daughter has been diagnosed with ALS and she is strugglling caring for her, her husband and making time to exercise.  She denies any polyuria, polydipsia or dizziness.  Depression screen Victoria Bryant 2/9 10/06/2016 10/06/2015 10/06/2015 09/27/2014  Decreased Interest 0 0 0 0  Down, Depressed, Hopeless 0 0 0 0  PHQ - 2 Score 0 0 0 0    Allergies  Allergen Reactions  . Clarithromycin   . Doxycycline Hyclate     REACTION: unspecified  . Erythromycin Ethylsuccinate     REACTION: unspecified  . Paroxetine     REACTION: unspecified   Social History  Substance Use Topics  . Smoking status: Former Smoker    Quit date: 12/06/1973  . Smokeless tobacco: Never Used  . Alcohol use No   Past Medical History:  Diagnosis Date  . COLONIC POLYPS, HX OF 04/21/2007  . DEPRESSION 05/26/2009  . Diverticulitis   . GERD 04/08/2008  . Interstitial cystitis    ? per pt: was told by urologist she has this  . 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 MENOPAUSAL SYNDROME 04/21/2007  . NECK PAIN, CHRONIC 04/21/2007   Past Surgical History:  Procedure Laterality Date  . COLONOSCOPY  x 2   First time polyps, second time no polyps: recall 10 yrs (next is approx 2024 per pt report)  . INCISION AND DRAINAGE  ABSCESS ANAL    . TONSILLECTOMY    . VAGINAL HYSTERECTOMY     Nonmalignant reason.  Ovaries still in.   Family History  Problem Relation Age of Onset  . Bladder Cancer Mother   . Lung cancer Mother   . Breast cancer Paternal Aunt    Allergies as of 04/14/2017      Reactions   Clarithromycin    Doxycycline Hyclate    REACTION: unspecified   Erythromycin Ethylsuccinate    REACTION: unspecified   Paroxetine    REACTION: unspecified      Medication List       Accurate as of 04/14/17  8:32 AM. Always use your most recent med list.          ALPRAZolam 0.5 MG tablet Commonly known as:  XANAX Take 1 tablet (0.5 mg total) by mouth daily.   fluticasone 50 MCG/ACT nasal spray Commonly known as:  FLONASE Place 1 spray into both nostrils daily.   hyoscyamine 0.125 MG SL tablet Commonly known as:  LEVSIN/SL Place 1 tablet (0.125 mg total) under the tongue every 6 (six) hours as needed.   meloxicam 15 MG tablet Commonly known as:  MOBIC Take 1 tablet (15 mg total) by mouth daily.   pantoprazole 40 MG tablet Commonly known as:  PROTONIX Take 1 tablet (40 mg  total) by mouth daily.   tolterodine 4 MG 24 hr capsule Commonly known as:  DETROL LA Take 4 mg by mouth daily as needed.       All past medical history, surgical history, allergies, family history, immunizations andmedications were updated in the EMR today and reviewed under the history and medication portions of their EMR.     ROS: Negative, with the exception of above mentioned in HPI   Objective:  BP 128/80 (BP Location: Right Arm, Patient Position: Sitting, Cuff Size: Normal)   Pulse 69   Temp 98.3 F (36.8 C)   Resp 20   Ht 5\' 3"  (1.6 m)   Wt 156 lb 8 oz (71 kg)   SpO2 97%   BMI 27.72 kg/m  Body mass index is 27.72 kg/m. Gen: Afebrile. No acute distress. Nontoxic in appearance, well developed, well nourished.  HENT: AT. [place].  MMM, no oral lesions.  Eyes:Pupils Equal Round Reactive to light,  Extraocular movements intact,  Conjunctiva without redness, discharge or icterus. CV: RRR  Chest: CTAB, no wheeze or crackles.  Neuro:  Normal gait. PERLA. EOMi. Alert. Oriented x3   No exam data present No results found. No results found for this or any previous visit (from the past 24 hour(s)).  Assessment/Plan: Victoria Bryant is a 71 y.o. female present for OV for  Elevated glucose level - stable today at 5.8 again. She is now walking more. It sounds like fruit is her main carbohydrate intake. Discussed "better" fruits as a far as gylcemic index with her today.  - diet modifications and increasing exercise.  - recheck at CPE and then yearly since maintaining, unless elevates on physicals.  - POCT glycosylated hemoglobin (Hb A1C)  Reviewed expectations re: course of current medical issues.  Discussed self-management of symptoms.  Outlined signs and symptoms indicating need for more acute intervention.  Patient verbalized understanding and all questions were answered.  Patient received an After-Visit Summary.   Note is dictated utilizing voice recognition software. Although note has been proof read prior to signing, occasional typographical errors still can be missed. If any questions arise, please do not hesitate to call for verification.   electronically signed by:  Howard Pouch, DO  [place]

## 2017-04-26 ENCOUNTER — Ambulatory Visit (INDEPENDENT_AMBULATORY_CARE_PROVIDER_SITE_OTHER): Payer: Medicare Other | Admitting: Family Medicine

## 2017-04-26 ENCOUNTER — Encounter: Payer: Self-pay | Admitting: Family Medicine

## 2017-04-26 ENCOUNTER — Telehealth: Payer: Self-pay | Admitting: Family Medicine

## 2017-04-26 VITALS — BP 138/78 | HR 75 | Temp 98.2°F | Resp 20 | Wt 154.8 lb

## 2017-04-26 DIAGNOSIS — K5790 Diverticulosis of intestine, part unspecified, without perforation or abscess without bleeding: Secondary | ICD-10-CM

## 2017-04-26 LAB — CBC WITH DIFFERENTIAL/PLATELET
BASOS PCT: 0.8 % (ref 0.0–3.0)
Basophils Absolute: 0.1 10*3/uL (ref 0.0–0.1)
EOS PCT: 4 % (ref 0.0–5.0)
Eosinophils Absolute: 0.4 10*3/uL (ref 0.0–0.7)
HEMATOCRIT: 40.2 % (ref 36.0–46.0)
Hemoglobin: 13.5 g/dL (ref 12.0–15.0)
LYMPHS PCT: 32.4 % (ref 12.0–46.0)
Lymphs Abs: 2.9 10*3/uL (ref 0.7–4.0)
MCHC: 33.7 g/dL (ref 30.0–36.0)
MCV: 91.2 fl (ref 78.0–100.0)
MONOS PCT: 7.9 % (ref 3.0–12.0)
Monocytes Absolute: 0.7 10*3/uL (ref 0.1–1.0)
Neutro Abs: 5 10*3/uL (ref 1.4–7.7)
Neutrophils Relative %: 54.9 % (ref 43.0–77.0)
PLATELETS: 288 10*3/uL (ref 150.0–400.0)
RBC: 4.4 Mil/uL (ref 3.87–5.11)
RDW: 12.7 % (ref 11.5–15.5)
WBC: 9.1 10*3/uL (ref 4.0–10.5)

## 2017-04-26 LAB — C-REACTIVE PROTEIN: CRP: 1.3 mg/dL (ref 0.5–20.0)

## 2017-04-26 MED ORDER — METRONIDAZOLE 500 MG PO TABS: 500.0000 mg | ORAL_TABLET | Freq: Two times a day (BID) | ORAL | 0 refills | Status: DC

## 2017-04-26 MED ORDER — CIPROFLOXACIN HCL 500 MG PO TABS: 500.0000 mg | ORAL_TABLET | Freq: Two times a day (BID) | ORAL | 0 refills | Status: DC

## 2017-04-26 MED ORDER — CIPROFLOXACIN HCL 500 MG PO TABS
500.0000 mg | ORAL_TABLET | Freq: Two times a day (BID) | ORAL | 0 refills | Status: DC
Start: 2017-04-26 — End: 2017-06-09

## 2017-04-26 NOTE — Patient Instructions (Addendum)
Labs collected today. We will call you with results once available.  If this worsens or occurs again we will get an image of her abdomen.  Please call Dr. Amedeo Plenty and find out if you are do for colonoscopy.    Cipro/flagyl prescribed for 7 days.

## 2017-04-26 NOTE — Progress Notes (Signed)
Victoria Bryant , 1946/01/29, 71 y.o., female MRN: [id number] Patient Care Team    Relationship Specialty Notifications Start End  [name], DO PCP - General Family Medicine  06/18/16   [name], MD Consulting Physician Obstetrics and Gynecology  10/06/16   [name], MD Consulting Physician Gastroenterology  10/08/16     Chief Complaint  Patient presents with  . Abdominal Pain    x several days     Subjective: Pt presents for an OV with complaints of Abdominal pain of 4 days duration.  Associated symptoms include low-grade fever, loose mucousy stools. Patient has a history of diverticulitis and states that presentation is similar. She had 2 days leftover of the Cipro/Flagyl antibiotics and took those. She noticed some improvement over the weekend, but now symptoms are returning. She has been able to tolerate liquids, but appetite is decreased. Patient's last bowel movement was yesterday, and was without blood per rectum. Bowel movement was mildly loose/mucousy. Patient's last colonoscopy 2014, with 5 year repeat recommended. Patient has had multiple bouts of diverticulitis approximately every 3-6 months for the last year. CT scan August 2017 reviewed. Patient had seen gastroenterology after last flare.   Depression screen [name] 2/9 10/06/2016 10/06/2015 10/06/2015 09/27/2014  Decreased Interest 0 0 0 0  Down, Depressed, Hopeless 0 0 0 0  PHQ - 2 Score 0 0 0 0    Allergies  Allergen Reactions  . Clarithromycin   . Doxycycline Hyclate     REACTION: unspecified  . Erythromycin Ethylsuccinate     REACTION: unspecified  . Paroxetine     REACTION: unspecified   Social History  Substance Use Topics  . Smoking status: Former Smoker    Quit date: 12/06/1973  . Smokeless tobacco: Never Used  . Alcohol use No   Past Medical History:  Diagnosis Date  . COLONIC POLYPS, HX OF 04/21/2007  . DEPRESSION 05/26/2009  . Diverticulitis   . GERD 04/08/2008  . Interstitial  cystitis    ? per pt: was told by urologist she has this  . 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 MENOPAUSAL SYNDROME 04/21/2007  . NECK PAIN, CHRONIC 04/21/2007   Past Surgical History:  Procedure Laterality Date  . COLONOSCOPY  x 2   First time polyps, second time no polyps: recall 10 yrs (next is approx 2024 per pt report)  . INCISION AND DRAINAGE ABSCESS ANAL    . TONSILLECTOMY    . VAGINAL HYSTERECTOMY     Nonmalignant reason.  Ovaries still in.   Family History  Problem Relation Age of Onset  . Bladder Cancer Mother   . Lung cancer Mother   . Breast cancer Paternal Aunt    Allergies as of 04/26/2017      Reactions   Clarithromycin    Doxycycline Hyclate    REACTION: unspecified   Erythromycin Ethylsuccinate    REACTION: unspecified   Paroxetine    REACTION: unspecified      Medication List       Accurate as of 04/26/17  1:48 PM. Always use your most recent med list.          ALPRAZolam 0.5 MG tablet Commonly known as:  XANAX Take 1 tablet (0.5 mg total) by mouth daily.   fluticasone 50 MCG/ACT nasal spray Commonly known as:  FLONASE Place 1 spray into both nostrils daily.   hyoscyamine 0.125 MG SL tablet Commonly known as:  LEVSIN/SL Place 1 tablet (0.125 mg total) under the tongue  every 6 (six) hours as needed.   meloxicam 15 MG tablet Commonly known as:  MOBIC Take 1 tablet (15 mg total) by mouth daily.   pantoprazole 40 MG tablet Commonly known as:  PROTONIX Take 1 tablet (40 mg total) by mouth daily.   tolterodine 4 MG 24 hr capsule Commonly known as:  DETROL LA Take 4 mg by mouth daily as needed.       All past medical history, surgical history, allergies, family history, immunizations andmedications were updated in the EMR today and reviewed under the history and medication portions of their EMR.     ROS: Negative, with the exception of above mentioned in HPI   Objective:  BP 138/78 (BP Location: Right Arm, Patient Position:  Sitting, Cuff Size: Normal)   Pulse 75   Temp 98.2 F (36.8 C)   Resp 20   Wt 154 lb 12 oz (70.2 kg)   SpO2 98%   BMI 27.41 kg/m  Body mass index is 27.41 kg/m. Gen: Afebrile. No acute distress. Nontoxic in appearance, well developed, well nourished. Very pleasant Caucasian female. HENT: AT. [place].  MMM Eyes:Pupils Equal Round Reactive to light, Extraocular movements intact,  Conjunctiva without redness, discharge or icterus. CV: RRR, no edema Chest: CTAB, no wheeze or crackles. Good air movement, normal resp effort.  Abd: Soft. Round. ND. Mild tenderness left lower quadrant BS present. No Masses palpated. No rebound or guarding.  Skin: No rashes, purpura or petechiae. No abdominal skin changes Neuro: Normal gait. PERLA. EOMi. Alert. Oriented x3  No exam data present No results found. No results found for this or any previous visit (from the past 24 hour(s)).  Assessment/Plan: Victoria Bryant is a 71 y.o. female present for OV for  1. Diverticulosis of intestine without bleeding, unspecified intestinal tract location - Cipro/Flagyl prescribed. Will obtain CBC and T her PT. Discussed with patient if she has non-resolving symptoms, or repeat flare within the next few months I would encourage her strongly to be seen by gastroenterology. Would also want to obtain imaging with onset of symptoms. Patient is in agreement with plan. - CBC w/Diff - C-reactive protein - Follow-up 2 weeks if not resolved, sooner if worsening.  Reviewed expectations re: course of current medical issues.  Discussed self-management of symptoms.  Outlined signs and symptoms indicating need for more acute intervention.  Patient verbalized understanding and all questions were answered.  Patient received an After-Visit Summary.  Note is dictated utilizing voice recognition software. Although note has been proof read prior to signing, occasional typographical errors still can be missed. If any questions arise,  please do not hesitate to call for verification.   electronically signed by:  Howard Pouch, DO  [place]

## 2017-04-26 NOTE — Telephone Encounter (Signed)
Please call pt: - her labs look stable.

## 2017-04-27 NOTE — Telephone Encounter (Signed)
Patient aware of lab results, expressed understanding.

## 2017-06-09 ENCOUNTER — Ambulatory Visit (INDEPENDENT_AMBULATORY_CARE_PROVIDER_SITE_OTHER): Payer: Medicare Other | Admitting: Family Medicine

## 2017-06-09 ENCOUNTER — Encounter: Payer: Self-pay | Admitting: Family Medicine

## 2017-06-09 VITALS — BP 123/75 | HR 64 | Temp 98.1°F | Resp 20 | Ht 63.0 in | Wt 152.5 lb

## 2017-06-09 DIAGNOSIS — K5790 Diverticulosis of intestine, part unspecified, without perforation or abscess without bleeding: Secondary | ICD-10-CM | POA: Diagnosis not present

## 2017-06-09 DIAGNOSIS — K219 Gastro-esophageal reflux disease without esophagitis: Secondary | ICD-10-CM

## 2017-06-09 DIAGNOSIS — Z860100 Personal history of colon polyps, unspecified: Secondary | ICD-10-CM

## 2017-06-09 DIAGNOSIS — R7989 Other specified abnormal findings of blood chemistry: Secondary | ICD-10-CM

## 2017-06-09 DIAGNOSIS — R946 Abnormal results of thyroid function studies: Secondary | ICD-10-CM | POA: Diagnosis not present

## 2017-06-09 DIAGNOSIS — R1013 Epigastric pain: Secondary | ICD-10-CM | POA: Insufficient documentation

## 2017-06-09 DIAGNOSIS — F418 Other specified anxiety disorders: Secondary | ICD-10-CM

## 2017-06-09 HISTORY — DX: Diverticulosis of intestine, part unspecified, without perforation or abscess without bleeding: K57.90

## 2017-06-09 LAB — COMPLETE METABOLIC PANEL WITH GFR
ALBUMIN: 4.2 g/dL (ref 3.6–5.1)
ALK PHOS: 88 U/L (ref 33–130)
ALT: 17 U/L (ref 6–29)
AST: 18 U/L (ref 10–35)
BUN: 8 mg/dL (ref 7–25)
CALCIUM: 9.4 mg/dL (ref 8.6–10.4)
CO2: 27 mmol/L (ref 20–31)
Chloride: 104 mmol/L (ref 98–110)
Creat: 0.77 mg/dL (ref 0.60–0.93)
GFR, EST AFRICAN AMERICAN: 89 mL/min (ref >=60)
GFR, EST NON AFRICAN AMERICAN: 77 mL/min (ref >=60)
Glucose, Bld: 102 mg/dL — ABNORMAL HIGH (ref 65–99)
POTASSIUM: 4.4 mmol/L (ref 3.5–5.3)
Sodium: 139 mmol/L (ref 135–146)
Total Bilirubin: 0.5 mg/dL (ref 0.2–1.2)
Total Protein: 6.9 g/dL (ref 6.1–8.1)

## 2017-06-09 LAB — LIPASE: LIPASE: 35 U/L (ref 11.0–59.0)

## 2017-06-09 LAB — TSH: TSH: 3.24 u[IU]/mL (ref 0.35–4.50)

## 2017-06-09 LAB — H. PYLORI ANTIBODY, IGG: H Pylori IgG: NEGATIVE

## 2017-06-09 MED ORDER — PANTOPRAZOLE SODIUM 40 MG PO TBEC: 40.0000 mg | DELAYED_RELEASE_TABLET | Freq: Every day | ORAL | 3 refills | Status: DC

## 2017-06-09 MED ORDER — ESCITALOPRAM OXALATE 10 MG PO TABS: 10.0000 mg | ORAL_TABLET | Freq: Every day | ORAL | 0 refills | Status: DC

## 2017-06-09 NOTE — Progress Notes (Signed)
Victoria Bryant , 08-27-1946, 71 y.o., female MRN: [id number] Patient Care Team    Relationship Specialty Notifications Start End  [name], DO PCP - General Family Medicine  06/18/16   [name], MD Consulting Physician Obstetrics and Gynecology  10/06/16   [name], MD Consulting Physician Gastroenterology  10/08/16     Chief Complaint  Patient presents with  . Adenopathy    abdomen and under arms     Subjective: Pt presents for an OV with complaints of worsening epigastric pain of > 1 week  duration.  Associated symptoms include bloating, belching. She reports the discomfort is worse with bending over. She does endorse increase in indigestion over the last month. She had within the last month another diverticulosis flare that did respond to abx. She had 10 lbs of weight loss in the last 6 months. She admits to watching proportions but has not been able to exercise secondary to taking care of her daughter who has been diagnose with ALS on October and her husband which is also ill. She also feels she has right swollen nodes on her armpit for the last week. Mildly elevated tsh in 10/2016 (4.7). Pt has a h/o GERD and takes protinix about 1-2 x week. H/O melanoma left forearrn with wide excision 2008. H/O colon polyps, last colonoscopy 2014 with 5 year repeat (Dr.hayes). H/o gastritis 2009 with negative H.Pylori.   Depression screen [facility] 2/9 06/09/2017 10/06/2016 10/06/2015 10/06/2015 09/27/2014  Decreased Interest 3 0 0 0 0  Down, Depressed, Hopeless 2 0 0 0 0  PHQ - 2 Score 5 0 0 0 0  Altered sleeping 0 - - - -  Tired, decreased energy 1 - - - -  Change in appetite 0 - - - -  Feeling bad or failure about yourself  0 - - - -  Trouble concentrating 3 - - - -  Moving slowly or fidgety/restless 0 - - - -  Suicidal thoughts 0 - - - -  PHQ-9 Score 9 - - - -   GAD 7 : Generalized Anxiety Score 06/09/2017  Nervous, Anxious, on Edge 3  Control/stop worrying 3  Worry too much -  different things 3  Trouble relaxing 3  Restless 1  Easily annoyed or irritable 3  Afraid - awful might happen 3  Total GAD 7 Score 19  Anxiety Difficulty Somewhat difficult    Allergies  Allergen Reactions  . Clarithromycin   . Doxycycline Hyclate     REACTION: unspecified  . Erythromycin Ethylsuccinate     REACTION: unspecified  . Paroxetine     REACTION: unspecified   Social History  Substance Use Topics  . Smoking status: Former Smoker    Quit date: 12/06/1973  . Smokeless tobacco: Never Used  . Alcohol use No   Past Medical History:  Diagnosis Date  . COLONIC POLYPS, HX OF 04/21/2007  . DEPRESSION 05/26/2009  . Diverticulitis   . GERD 04/08/2008  . Interstitial cystitis    ? per pt: was told by urologist she has this  . 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 MENOPAUSAL SYNDROME 04/21/2007  . NECK PAIN, CHRONIC 04/21/2007   Past Surgical History:  Procedure Laterality Date  . COLONOSCOPY  x 2   First time polyps, second time no polyps: recall 10 yrs (next is approx 2024 per pt report)  . INCISION AND DRAINAGE ABSCESS ANAL    . TONSILLECTOMY    . VAGINAL HYSTERECTOMY  Nonmalignant reason.  Ovaries still in.   Family History  Problem Relation Age of Onset  . Bladder Cancer Mother   . Lung cancer Mother   . Breast cancer Paternal Aunt    Allergies as of 06/09/2017      Reactions   Clarithromycin    Doxycycline Hyclate    REACTION: unspecified   Erythromycin Ethylsuccinate    REACTION: unspecified   Paroxetine    REACTION: unspecified      Medication List       Accurate as of 06/09/17  8:53 AM. Always use your most recent med list.          ALPRAZolam 0.5 MG tablet Commonly known as:  XANAX Take 1 tablet (0.5 mg total) by mouth daily.   escitalopram 10 MG tablet Commonly known as:  LEXAPRO Take 1 tablet (10 mg total) by mouth at bedtime.   fluticasone 50 MCG/ACT nasal spray Commonly known as:  FLONASE Place 1 spray into both nostrils  daily.   hyoscyamine 0.125 MG SL tablet Commonly known as:  LEVSIN/SL Place 1 tablet (0.125 mg total) under the tongue every 6 (six) hours as needed.   meloxicam 15 MG tablet Commonly known as:  MOBIC Take 1 tablet (15 mg total) by mouth daily.   pantoprazole 40 MG tablet Commonly known as:  PROTONIX Take 1 tablet (40 mg total) by mouth daily.   tolterodine 4 MG 24 hr capsule Commonly known as:  DETROL LA Take 4 mg by mouth daily as needed.       All past medical history, surgical history, allergies, family history, immunizations andmedications were updated in the EMR today and reviewed under the history and medication portions of their EMR.     ROS: Negative, with the exception of above mentioned in HPI   Objective:  BP 123/75 (BP Location: Right Arm, Patient Position: Sitting, Cuff Size: Normal)   Pulse 64   Temp 98.1 F (36.7 C)   Resp 20   Ht 5\' 3"  (1.6 m)   Wt 152 lb 8 oz (69.2 kg)   SpO2 97%   BMI 27.01 kg/m  Body mass index is 27.01 kg/m. Gen: Afebrile. No acute distress. Nontoxic in appearance, well developed, well nourished. pleasant caucasian female.  HENT: AT. .MMM, no oral lesions.  Eyes:Pupils Equal Round Reactive to light, Extraocular movements intact,  Conjunctiva without redness, discharge or icterus. Neck/lymp/endocrine: Supple,no lymphadenopathy, no thyromegaly.  CV: RRR  Chest: CTAB, no wheeze or crackles.   Abd: Soft. flat. Mild TTP midline epigastric. ND. BS present. no Masses palpated. No rebound or guarding. No HSM Skin: no rashes, purpura or petechiae.  Neuro:  Normal gait. PERLA. EOMi. Alert. Oriented x3  Psych: Appears sad, mildly overwhelmed, otherwise Normal affect, dress and demeanor. Normal speech. Normal thought content and judgment.  No exam data present No results found. No results found for this or any previous visit (from the past 24 hour(s)).  Assessment/Plan: Victoria Bryant is a 71 y.o. female present for OV for    Elevated TSH/Depression with anxiety - PHQ and GAD screening positive today. Discussed start of lexapro 10 mg qd and she is interested.  - She is prescribed xanax, but rarely takes medication. Would like to completely remove if able.  - rpt TSH today. Mildly elevated 10/2016 - f/u 4 weeks  Epigastric pain History of colonic polyps Gastroesophageal reflux disease without esophagitis Diverticulosis of intestine without bleeding, unspecified intestinal tract location - Discussed with pt different possible etiologies  of her discomfort. She has been seen a few times for lower abd pain last few months, and now with increased epigastric pain, bloating, belching and h/o colon polyps/diverticulosis. Discussed different approaches and agreed upon labs today CMP, H. Pylori, lipase.  - Take Protonix daily, not intermittently.  - Start lexapro 10 mg QD, hopefully decreasing her anxiety level will help.  - COMPLETE METABOLIC PANEL WITH GFR - Lipase - H. pylori antibody, IgG - if symptoms not resolved will move forward with CT and/or GI. She would like to wait until trial of meds to see if resolves.   F/U: 4 weeks.    Reviewed expectations re: course of current medical issues.  Discussed self-management of symptoms.  Outlined signs and symptoms indicating need for more acute intervention.  Patient verbalized understanding and all questions were answered.  Patient received an After-Visit Summary.    Orders Placed This Encounter  Procedures  . COMPLETE METABOLIC PANEL WITH GFR  . Lipase  . H. pylori antibody, IgG  . TSH     Note is dictated utilizing voice recognition software. Although note has been proof read prior to signing, occasional typographical errors still can be missed. If any questions arise, please do not hesitate to call for verification.   electronically signed by:  Howard Pouch, DO  [place]

## 2017-06-09 NOTE — Patient Instructions (Addendum)
Start lexapro 10 mg a day and follow up in 4 weeks, before needing refills.  We will call you with lab results once available.   Start taking the protonix every day.    If worsening come back sooner than 4 weeks. If not resolved would want to move forward with CT an/or GI.     Helicobacter Pylori Infection Helicobacter pylori infection is an infection in the stomach that is caused by the Helicobacter pylori (H. pylori) bacteria. This type of bacteria often lives in the lining of the stomach. The infection can cause ulcers and irritation (gastritis) in some people. It is the most common cause of ulcers in the stomach (gastric ulcer) and in the upper part of the intestine (duodenal ulcer). Having this infection may also increase the risk of stomach cancer and a type of white blood cell cancer (lymphoma) that affects the stomach. What are the causes? H. pylori is a type of bacteria that is often found in the stomachs of healthy people. The bacteria may be passed from person to person through contact with stool or saliva. It is not known why some people develop ulcers, gastritis, or cancer from the infection. What increases the risk? This condition is more likely to develop in people who:  Have family members with the infection.  Live with many other people, such as in a dormitory.  Are of African, Hispanic, or Asian descent.  What are the signs or symptoms? Most people with this infection do not have symptoms. If you do have symptoms, they may include:  Heartburn.  Stomach pain.  Nausea.  Vomiting.  Blood-tinged vomit.  Loss of appetite.  Bad breath.  How is this diagnosed? This condition may be diagnosed based on your symptoms, a physical exam, and various tests. Tests may include:  Blood tests or stool tests to check for the proteins (antibodies) that your body may produce in response to the bacteria. These tests are the best way to confirm the diagnosis.  A breath test to  check for the type of gas that the H. pylori bacteria release after breaking down a substance called urea. For the test, you are asked to drink urea. This test is often done after treatment in order to find out if the treatment worked.  A procedure in which a thin, flexible tube with a tiny camera at the end is placed into your stomach and upper intestine (upper endoscopy). Your health care provider may also take tissue samples (biopsy) to test for H. pylori and cancer.  How is this treated? Treatment for this condition usually involves taking a combination of medicines (triple therapy) for a couple of weeks. Triple therapy includes one medicine to reduce the acid in your stomach and two types of antibiotic medicines. Many drug combinations have been approved for treatment. Treatment usually kills the H. pylori and reduces your risk of cancer. You may need to be tested for H. pylori again after treatment. In some cases, the treatment may need to be repeated. Follow these instructions at home:  Take over-the-counter and prescription medicines only as told by your health care provider.  Take your antibiotics as told by your health care provider. Do not stop taking the antibiotics even if you start to feel better.  You can do all your usual activities and eat what you usually do.  Take steps to prevent future infections: ? Wash your hands often. ? Make sure the food you eat has been properly prepared. ? Drink water  only from clean sources.  Keep all follow-up visits as told by your health care provider. This is important. Contact a health care provider if:  Your symptoms do not get better.  Your symptoms return after treatment. This information is not intended to replace advice given to you by your health care provider. Make sure you discuss any questions you have with your health care provider. Document Released: 03/15/2016 Document Revised: 04/29/2016 Document Reviewed: 12/04/2014 Elsevier  Interactive Patient Education  2018 [employer].  Hiatal Hernia A hiatal hernia occurs when part of the stomach slides above the muscle that separates the abdomen from the chest (diaphragm). A person can be born with a hiatal hernia (congenital), or it may develop over time. In almost all cases of hiatal hernia, only the top part of the stomach pushes through the diaphragm. Many people have a hiatal hernia with no symptoms. The larger the hernia, the more likely it is that you will have symptoms. In some cases, a hiatal hernia allows stomach acid to flow back into the tube that carries food from your mouth to your stomach (esophagus). This may cause heartburn symptoms. Severe heartburn symptoms may mean that you have developed a condition called gastroesophageal reflux disease (GERD). What are the causes? This condition is caused by a weakness in the opening (hiatus) where the esophagus passes through the diaphragm to attach to the upper part of the stomach. A person may be born with a weakness in the hiatus, or a weakness can develop over time. What increases the risk? This condition is more likely to develop in:  Older people. Age is a major risk factor for a hiatal hernia, especially if you are over the age of 38.  Pregnant women.  People who are overweight.  People who have frequent constipation.  What are the signs or symptoms? Symptoms of this condition usually develop in the form of GERD symptoms. Symptoms include:  Heartburn.  Belching.  Indigestion.  Trouble swallowing.  Coughing or wheezing.  Sore throat.  Hoarseness.  Chest pain.  Nausea and vomiting.  How is this diagnosed? This condition may be diagnosed during testing for GERD. Tests that may be done include:  X-rays of your stomach or chest.  An upper gastrointestinal (GI) series. This is an X-ray exam of your GI tract that is taken after you swallow a chalky liquid that shows up clearly on the  X-ray.  Endoscopy. This is a procedure to look into your stomach using a thin, flexible tube that has a tiny camera and light on the end of it.  How is this treated? This condition may be treated by:  Dietary and lifestyle changes to help reduce GERD symptoms.  Medicines. These may include: ? Over-the-counter antacids. ? Medicines that make your stomach empty more quickly. ? Medicines that block the production of stomach acid (H2 blockers). ? Stronger medicines to reduce stomach acid (proton pump inhibitors).  Surgery to repair the hernia, if other treatments are not helping.  If you have no symptoms, you may not need treatment. Follow these instructions at home: Lifestyle and activity  Do not use any products that contain nicotine or tobacco, such as cigarettes and e-cigarettes. If you need help quitting, ask your health care provider.  Try to achieve and maintain a healthy body weight.  Avoid putting pressure on your abdomen. Anything that puts pressure on your abdomen increases the amount of acid that may be pushed up into your esophagus. ? Avoid bending over, especially  after eating. ? Raise the head of your bed by putting blocks under the legs. This keeps your head and esophagus higher than your stomach. ? Do not wear tight clothing around your chest or stomach. ? Try not to strain when having a bowel movement, when urinating, or when lifting heavy objects. Eating and drinking  Avoid foods that can worsen GERD symptoms. These may include: ? Fatty foods, like fried foods. ? Citrus fruits, like oranges or lemon. ? Other foods and drinks that contain acid, like orange juice or tomatoes. ? Spicy food. ? Chocolate.  Eat frequent small meals instead of three large meals a day. This helps prevent your stomach from getting too full. ? Eat slowly. ? Do not lie down right after eating. ? Do not eat 1-2 hours before bed.  Do not drink beverages with caffeine. These include cola,  coffee, cocoa, and tea.  Do not drink alcohol. General instructions  Take over-the-counter and prescription medicines only as told by your health care provider.  Keep all follow-up visits as told by your health care provider. This is important. Contact a health care provider if:  Your symptoms are not controlled with medicines or lifestyle changes.  You are having trouble swallowing.  You have coughing or wheezing that will not go away. Get help right away if:  Your pain is getting worse.  Your pain spreads to your arms, neck, jaw, teeth, or back.  You have shortness of breath.  You sweat for no reason.  You feel sick to your stomach (nauseous) or you vomit.  You vomit blood.  You have bright red blood in your stools.  You have black, tarry stools. This information is not intended to replace advice given to you by your health care provider. Make sure you discuss any questions you have with your health care provider. Document Released: 02/12/2004 Document Revised: 11/15/2016 Document Reviewed: 11/15/2016 Elsevier Interactive Patient Education  [employer].

## 2017-06-10 ENCOUNTER — Telehealth: Payer: Self-pay | Admitting: Family Medicine

## 2017-06-10 NOTE — Telephone Encounter (Signed)
Spoke with patient reviewed lab results. 

## 2017-06-10 NOTE — Telephone Encounter (Signed)
Please call pt: - her labs are all normal.

## 2017-06-30 ENCOUNTER — Encounter: Payer: Self-pay | Admitting: Family Medicine

## 2017-06-30 ENCOUNTER — Ambulatory Visit (INDEPENDENT_AMBULATORY_CARE_PROVIDER_SITE_OTHER): Payer: Medicare Other | Admitting: Family Medicine

## 2017-06-30 VITALS — BP 120/73 | HR 69 | Temp 98.0°F | Resp 20 | Ht 63.0 in | Wt 153.0 lb

## 2017-06-30 DIAGNOSIS — G479 Sleep disorder, unspecified: Secondary | ICD-10-CM | POA: Insufficient documentation

## 2017-06-30 DIAGNOSIS — F418 Other specified anxiety disorders: Secondary | ICD-10-CM

## 2017-06-30 MED ORDER — TRAZODONE HCL 50 MG PO TABS: 25.0000 mg | ORAL_TABLET | Freq: Every evening | ORAL | 0 refills | Status: DC | PRN

## 2017-06-30 MED ORDER — ESCITALOPRAM OXALATE 20 MG PO TABS
10.0000 mg | ORAL_TABLET | Freq: Every day | ORAL | 0 refills | Status: DC
Start: 2017-06-30 — End: 2017-07-28

## 2017-06-30 NOTE — Progress Notes (Signed)
Victoria Bryant , [birth year], 71 y.o., female MRN: [id number] Patient Care Team    Relationship Specialty Notifications Start End  [name], DO PCP - General Family Medicine  06/18/16   Victoria Gell, MD Consulting Physician Obstetrics and Gynecology  10/06/16   [name], MD Consulting Physician Gastroenterology  10/08/16     Chief Complaint  Patient presents with  . Depression  . Anxiety     Subjective:  Depression with anxiety/Sleep disturbance Pt reports she is still unable to sleep more than 3 hours a night. She has a lot of family issues right now, with her daughter being ill (ALS) and her husband needing her care as well. She reports tolerating the lexapro 10 mg, but not feeling a large difference with its use. She denies any negative side effects to the medication. She does admit she is not crying as much anymore.  Depression screen [facility] 2/9 06/30/2017 06/09/2017 10/06/2016 10/06/2015 10/06/2015  Decreased Interest 0 3 0 0 0  Down, Depressed, Hopeless 0 2 0 0 0  PHQ - 2 Score 0 5 0 0 0  Altered sleeping 3 0 - - -  Tired, decreased energy 3 1 - - -  Change in appetite 0 0 - - -  Feeling bad or failure about yourself  0 0 - - -  Trouble concentrating 0 3 - - -  Moving slowly or fidgety/restless 0 0 - - -  Suicidal thoughts 0 0 - - -  PHQ-9 Score 6 9 - - -   GAD 7 : Generalized Anxiety Score 06/09/2017  Nervous, Anxious, on Edge 3  Control/stop worrying 3  Worry too much - different things 3  Trouble relaxing 3  Restless 1  Easily annoyed or irritable 3  Afraid - awful might happen 3  Total GAD 7 Score 19  Anxiety Difficulty Somewhat difficult    Allergies  Allergen Reactions  . Clarithromycin   . Doxycycline Hyclate     REACTION: unspecified  . Erythromycin Ethylsuccinate     REACTION: unspecified  . Paroxetine     REACTION: unspecified   Social History  Substance Use Topics  . Smoking status: Former Smoker    Quit date: 12/06/1973  . Smokeless  tobacco: Never Used  . Alcohol use No   Past Medical History:  Diagnosis Date  . COLONIC POLYPS, HX OF 04/21/2007  . DEPRESSION 05/26/2009  . Diverticulitis   . GERD 04/08/2008  . Interstitial cystitis    ? per pt: was told by urologist she has this  . 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 MENOPAUSAL SYNDROME 04/21/2007  . NECK PAIN, CHRONIC 04/21/2007   Past Surgical History:  Procedure Laterality Date  . COLONOSCOPY  x 2   First time polyps, second time no polyps: recall 10 yrs (next is approx 2024 per pt report)  . INCISION AND DRAINAGE ABSCESS ANAL    . TONSILLECTOMY    . VAGINAL HYSTERECTOMY     Nonmalignant reason.  Ovaries still in.   Family History  Problem Relation Age of Onset  . Bladder Cancer Mother   . Lung cancer Mother   . Breast cancer Paternal Aunt    Allergies as of 06/30/2017      Reactions   Clarithromycin    Doxycycline Hyclate    REACTION: unspecified   Erythromycin Ethylsuccinate    REACTION: unspecified   Paroxetine    REACTION: unspecified      Medication List  Accurate as of 06/30/17  8:08 AM. Always use your most recent med list.          ALPRAZolam 0.5 MG tablet Commonly known as:  XANAX Take 1 tablet (0.5 mg total) by mouth daily.   escitalopram 10 MG tablet Commonly known as:  LEXAPRO Take 1 tablet (10 mg total) by mouth at bedtime.   fluticasone 50 MCG/ACT nasal spray Commonly known as:  FLONASE Place 1 spray into both nostrils daily.   hyoscyamine 0.125 MG SL tablet Commonly known as:  LEVSIN/SL Place 1 tablet (0.125 mg total) under the tongue every 6 (six) hours as needed.   meloxicam 15 MG tablet Commonly known as:  MOBIC Take 1 tablet (15 mg total) by mouth daily.   pantoprazole 40 MG tablet Commonly known as:  PROTONIX Take 1 tablet (40 mg total) by mouth daily.   tolterodine 4 MG 24 hr capsule Commonly known as:  DETROL LA Take 4 mg by mouth daily as needed.       All past medical history,  surgical history, allergies, family history, immunizations andmedications were updated in the EMR today and reviewed under the history and medication portions of their EMR.     ROS: Negative, with the exception of above mentioned in HPI   Objective:  BP 120/73 (BP Location: Right Arm, Patient Position: Sitting, Cuff Size: Normal)   Pulse 69   Temp 98 F (36.7 C)   Resp 20   Ht 5\' 3"  (1.6 m)   Wt 153 lb (69.4 kg)   SpO2 96%   BMI 27.10 kg/m  Body mass index is 27.1 kg/m. Gen: Afebrile. No acute distress.  HENT: AT. [place]. Bilateral TM visualized and normal in appearance, Mild air-fluid levels. MMM. Bilateral nares without erythema or swelling. Throat without erythema or exudates. No cough, no hoarseness. Eyes:Pupils Equal Round Reactive to light, Extraocular movements intact,  Conjunctiva without redness, discharge or icterus. CV: RRR  Chest: CTAB, no wheeze or crackles  Neuro:  Normal gait. PERLA. EOMi. Alert. Oriented.  Psych: Normal affect, dress and demeanor. Normal speech. Normal thought content and judgment..    No exam data present No results found. No results found for this or any previous visit (from the past 24 hour(s)).  Assessment/Plan: Victoria Bryant is a 71 y.o. female present for OV for  Depression with anxiety/sleep disturbances - Discussed options with patient today. Decided to increase Lexapro to 20 mg a day. If she feels this is too much after starting the medication she can break in half and continue the 10 mg only. - Trial of trazodone, with taper instructions 25 mg-75 mg. - She is prescribed xanax, but rarely takes medication. Would like to completely remove if able.  - Follow-up 3.5 weeks, if doing well at that time will get 90 day refills and see and 3 months    Reviewed expectations re: course of current medical issues.  Discussed self-management of symptoms.  Outlined signs and symptoms indicating need for more acute intervention.  Patient  verbalized understanding and all questions were answered.  Patient received an After-Visit Summary.    No orders of the defined types were placed in this encounter.    Note is dictated utilizing voice recognition software. Although note has been proof read prior to signing, occasional typographical errors still can be missed. If any questions arise, please do not hesitate to call for verification.   electronically signed by:  Howard Pouch, DO  [facility]

## 2017-06-30 NOTE — Patient Instructions (Signed)
Lexapro we increased to 20 mg, if you find it is too much cut it in half and continue with the 10 mg dose.  Trazodone start with 1/2 tab for 3 days 1 hour before bed. You can increase by 1/2 tab every 3 nights, to a max of 1.5 tabs. Stop at the dose that gives you good sleep without being too sleepy in the morning.  Follow up in 3.5 weeks.    Please help [place] help you:  We are honored you have chosen [facility] for your [facility] home. Below you will find basic instructions that you may need to access in the future. Please help [place] help you by reading the instructions, which cover many of the frequent questions we experience.   Prescription refills and request:  -In order to allow more efficient response time, please call your pharmacy for all refills. They will forward the request electronically to [place]. This allows for the quickest possible response. Request left on a nurse line can take longer to refill, since these are checked as time allows between office patients and other phone calls.  - refill request can take up to 3-5 working days to complete.  - If request is sent electronically and request is appropiate, it is usually completed in 1-2 business days.  - all patients will need to be seen routinely for all chronic medical conditions requiring prescription medications (see follow-up below). If you are overdue for follow up on your condition, you will be asked to make an appointment and we will call in enough medication to cover you until your appointment (up to 30 days).  - all controlled substances will require a face to face visit to request/refill.  - if you desire your prescriptions to go through a new pharmacy, and have an active script at original pharmacy, you will need to call your pharmacy and have scripts transferred to new pharmacy. This is completed between the pharmacy locations and not by your provider.    Results: If any images or labs were ordered, it can take up to 1  week to get results depending on the test ordered and the lab/facility running and resulting the test. - Normal or stable results, which do not need further discussion, may be released to your mychart immediately with attached note to you. A call may not be generated for normal results. Please make certain to sign up for mychart. If you have questions on how to activate your mychart you can call the front office.  - If your results need further discussion, our office will attempt to contact you via phone, and if unable to reach you after 2 attempts, we will release your abnormal result to your mychart with instructions.  - All results will be automatically released in mychart after 1 week.  - Your provider will provide you with explanation and instruction on all relevant material in your results. Please keep in mind, results and labs may appear confusing or abnormal to the untrained eye, but it does not mean they are actually abnormal for you personally. If you have any questions about your results that are not covered, or you desire more detailed explanation than what was provided, you should make an appointment with your provider to do so.   Our office handles many outgoing and incoming calls daily. If we have not contacted you within 1 week about your results, please check your mychart to see if there is a message first and if not, then  contact our office.  In helping with this matter, you help decrease call volume, and therefore allow [place] to be able to respond to patients needs more efficiently.   Acute office visits (sick visit):  An acute visit is intended for a new problem and are scheduled in shorter time slots to allow schedule openings for patients with new problems. This is the appropriate visit to discuss a new problem. In order to provide you with excellent quality medical care with proper time for you to explain your problem, have an exam and receive treatment with instructions, these  appointments should be limited to one new problem per visit. If you experience a new problem, in which you desire to be addressed, please make an acute office visit, we save openings on the schedule to accommodate you. Please do not save your new problem for any other type of visit, let us take care of it properly and quickly for you.   Follow up visits:  Depending on your condition(s) your provider will need to see you routinely in order to provide you with quality care and prescribe medication(s). Most chronic conditions (Example: hypertension, Diabetes, depression/anxiety... etc), require visits a couple times a year. Your provider will instruct you on proper follow up for your personal medical conditions and history. Please make certain to make follow up appointments for your condition as instructed. Failing to do so could result in lapse in your medication treatment/refills. If you request a refill, and are overdue to be seen on a condition, we will always provide you with a 30 day script (once) to allow you time to schedule.    Medicare wellness (well visit): - we have a wonderful Nurse Maudie Mercury), that will meet with you and provide you will yearly medicare wellness visits. These visits should occur yearly (can not be scheduled less than 1 calendar year apart) and cover preventive health, immunizations, advance directives and screenings you are entitled to yearly through your medicare benefits. Do not miss out on your entitled benefits, this is when medicare will pay for these benefits to be ordered for you.  These are strongly encouraged by your provider and is the appropriate type of visit to make certain you are up to date with all preventive health benefits. If you have not had your medicare wellness exam in the last 12 months, please make certain to schedule one by calling the office and schedule your medicare wellness with Maudie Mercury as soon as possible.   Yearly physical (well visit):  - Adults are  recommended to be seen yearly for physicals. Check with your insurance and date of your last physical, most insurances require one calendar year between physicals. Physicals include all preventive health topics, screenings, medical exam and labs that are appropriate for gender/age and history. You may have fasting labs needed at this visit. This is a well visit (not a sick visit), new problems should not be covered during this visit (see acute visit).  - Pediatric patients are seen more frequently when they are younger. Your provider will advise you on well child visit timing that is appropriate for your their age. - This is not a medicare wellness visit. Medicare wellness exams do not have an exam portion to the visit. Some medicare companies allow for a physical, some do not allow a yearly physical. If your medicare allows a yearly physical you can schedule the medicare wellness with our nurse Maudie Mercury and have your physical with your provider after, on the same day. Please  check with insurance for your full benefits.   Late Policy/No Shows:  - all new patients should arrive 15-30 minutes earlier than appointment to allow [place] time  to  obtain all personal demographics,  insurance information and for you to complete office paperwork. - All established patients should arrive 10-15 minutes earlier than appointment time to update all information and be checked in .  - In our best efforts to run on time, if you are late for your appointment you will be asked to either reschedule or if able, we will work you back into the schedule. There will be a wait time to work you back in the schedule,  depending on availability.  - If you are unable to make it to your appointment as scheduled, please call 24 hours ahead of time to allow [place] to fill the time slot with someone else who needs to be seen. If you do not cancel your appointment ahead of time, you may be charged a no show fee.

## 2017-07-28 ENCOUNTER — Ambulatory Visit (INDEPENDENT_AMBULATORY_CARE_PROVIDER_SITE_OTHER): Payer: Medicare Other | Admitting: Family Medicine

## 2017-07-28 ENCOUNTER — Encounter: Payer: Self-pay | Admitting: Family Medicine

## 2017-07-28 DIAGNOSIS — F418 Other specified anxiety disorders: Secondary | ICD-10-CM | POA: Diagnosis not present

## 2017-07-28 DIAGNOSIS — G479 Sleep disorder, unspecified: Secondary | ICD-10-CM

## 2017-07-28 MED ORDER — TRAZODONE HCL 50 MG PO TABS: 75.0000 mg | ORAL_TABLET | Freq: Every evening | ORAL | 0 refills | Status: DC | PRN

## 2017-07-28 MED ORDER — ESCITALOPRAM OXALATE 20 MG PO TABS: 20.0000 mg | ORAL_TABLET | Freq: Every day | ORAL | 0 refills | Status: DC

## 2017-07-28 NOTE — Progress Notes (Signed)
Victoria Bryant , [birth year], 71 y.o., female MRN: [id number] Patient Care Team    Relationship Specialty Notifications Start End  [name], DO PCP - General Family Medicine  06/18/16   [name], MD Consulting Physician Obstetrics and Gynecology  10/06/16   [name], MD Consulting Physician Gastroenterology  10/08/16     Chief Complaint  Patient presents with  . Depression  . Anxiety     Subjective:  Depression with anxiety/Sleep disturbance Patient reports she has seen great improvement with the use of Lexapro 20 mg and trazodone 75 mg daily. However she still having some difficulties. She would like to wait another 30 days before making her decision if she needs any changes in dosing of medication.  Prior note: Pt reports she is still unable to sleep more than 3 hours a night. She has a lot of family issues right now, with her daughter being ill (ALS) and her husband needing her care as well. She reports tolerating the lexapro 10 mg, but not feeling a large difference with its use. She denies any negative side effects to the medication. She does admit she is not crying as much anymore.  Depression screen [facility] 2/9 07/28/2017 06/30/2017 06/09/2017 10/06/2016 10/06/2015  Decreased Interest 0 0 3 0 0  Down, Depressed, Hopeless 0 0 2 0 0  PHQ - 2 Score 0 0 5 0 0  Altered sleeping 0 3 0 - -  Tired, decreased energy 3 3 1  - -  Change in appetite 0 0 0 - -  Feeling bad or failure about yourself  0 0 0 - -  Trouble concentrating 0 0 3 - -  Moving slowly or fidgety/restless 1 0 0 - -  Suicidal thoughts 0 0 0 - -  PHQ-9 Score 4 6 9  - -   GAD 7 : Generalized Anxiety Score 06/09/2017  Nervous, Anxious, on Edge 3  Control/stop worrying 3  Worry too much - different things 3  Trouble relaxing 3  Restless 1  Easily annoyed or irritable 3  Afraid - awful might happen 3  Total GAD 7 Score 19  Anxiety Difficulty Somewhat difficult    Allergies  Allergen Reactions  .  Clarithromycin   . Doxycycline Hyclate     REACTION: unspecified  . Erythromycin Ethylsuccinate     REACTION: unspecified  . Paroxetine     REACTION: unspecified   Social History  Substance Use Topics  . Smoking status: Former Smoker    Quit date: 12/06/1973  . Smokeless tobacco: Never Used  . Alcohol use No   Past Medical History:  Diagnosis Date  . COLONIC POLYPS, HX OF 04/21/2007  . DEPRESSION 05/26/2009  . Diverticulitis   . GERD 04/08/2008  . Interstitial cystitis    ? per pt: was told by urologist she has this  . 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 MENOPAUSAL SYNDROME 04/21/2007  . NECK PAIN, CHRONIC 04/21/2007   Past Surgical History:  Procedure Laterality Date  . COLONOSCOPY  x 2   First time polyps, second time no polyps: recall 10 yrs (next is approx 2024 per pt report)  . INCISION AND DRAINAGE ABSCESS ANAL    . TONSILLECTOMY    . VAGINAL HYSTERECTOMY     Nonmalignant reason.  Ovaries still in.   Family History  Problem Relation Age of Onset  . Bladder Cancer Mother   . Lung cancer Mother   . Breast cancer Paternal Aunt    Allergies  as of 07/28/2017      Reactions   Clarithromycin    Doxycycline Hyclate    REACTION: unspecified   Erythromycin Ethylsuccinate    REACTION: unspecified   Paroxetine    REACTION: unspecified      Medication List       Accurate as of 07/28/17  8:11 AM. Always use your most recent med list.          ALPRAZolam 0.5 MG tablet Commonly known as:  XANAX Take 1 tablet (0.5 mg total) by mouth daily.   escitalopram 20 MG tablet Commonly known as:  LEXAPRO Take 0.5 tablets (10 mg total) by mouth at bedtime.   fluticasone 50 MCG/ACT nasal spray Commonly known as:  FLONASE Place 1 spray into both nostrils daily.   hyoscyamine 0.125 MG SL tablet Commonly known as:  LEVSIN/SL Place 1 tablet (0.125 mg total) under the tongue every 6 (six) hours as needed.   meloxicam 15 MG tablet Commonly known as:  MOBIC Take 1  tablet (15 mg total) by mouth daily.   pantoprazole 40 MG tablet Commonly known as:  PROTONIX Take 1 tablet (40 mg total) by mouth daily.   tolterodine 4 MG 24 hr capsule Commonly known as:  DETROL LA Take 4 mg by mouth daily as needed.   traZODone 50 MG tablet Commonly known as:  DESYREL Take 0.5-1.5 tablets (25-75 mg total) by mouth at bedtime as needed for sleep.       All past medical history, surgical history, allergies, family history, immunizations andmedications were updated in the EMR today and reviewed under the history and medication portions of their EMR.     ROS: Negative, with the exception of above mentioned in HPI   Objective:  BP 134/81 (BP Location: Right Arm, Patient Position: Sitting, Cuff Size: Normal)   Pulse 71   Temp 98.1 F (36.7 C)   Resp 20   Ht 5\' 3"  (1.6 m)   Wt 154 lb 8 oz (70.1 kg)   SpO2 97%   BMI 27.37 kg/m  Body mass index is 27.37 kg/m. Gen: Afebrile. No acute distress.  HENT: AT. [place].  MMM.  Eyes:Pupils Equal Round Reactive to light, Extraocular movements intact,  Conjunctiva without redness, discharge or icterus. Psych: Normal affect, dress and demeanor. Normal speech. Normal thought content and judgment.  No exam data present No results found. No results found for this or any previous visit (from the past 24 hour(s)).  Assessment/Plan: Victoria Bryant is a 71 y.o. female present for OV for  Depression with anxiety/sleep disturbances - Discussed options with patient today. Decided to increase Lexapro to 20 mg a day. If she feels this is too much after starting the medication she can break in half and continue the 10 mg only. - Continue trazodone 75 mg daily - Follow-up 3 months if doing well, otherwise will need a follow-up center. Patient will call in one month for refill if doing well.   Reviewed expectations re: course of current medical issues.  Discussed self-management of symptoms.  Outlined signs and symptoms  indicating need for more acute intervention.  Patient verbalized understanding and all questions were answered.  Patient received an After-Visit Summary.    No orders of the defined types were placed in this encounter.    Note is dictated utilizing voice recognition software. Although note has been proof read prior to signing, occasional typographical errors still can be missed. If any questions arise, please do not hesitate to call  for verification.   electronically signed by:  Howard Pouch, DO  [place]

## 2017-07-28 NOTE — Patient Instructions (Signed)
Another 30 days called in for you. If doing great, just call in 30 days and we will refill for 90 days at pharmacy of choice.   If not ideal treatment, then would want to see you no later than 30 days.  Of course if you need me sooner, do not hesitate to make an appt.    Please help [place] help you:  We are honored you have chosen [facility] for your [facility] home. Below you will find basic instructions that you may need to access in the future. Please help [place] help you by reading the instructions, which cover many of the frequent questions we experience.   Prescription refills and request:  -In order to allow more efficient response time, please call your pharmacy for all refills. They will forward the request electronically to [place]. This allows for the quickest possible response. Request left on a nurse line can take longer to refill, since these are checked as time allows between office patients and other phone calls.  - refill request can take up to 3-5 working days to complete.  - If request is sent electronically and request is appropiate, it is usually completed in 1-2 business days.  - all patients will need to be seen routinely for all chronic medical conditions requiring prescription medications (see follow-up below). If you are overdue for follow up on your condition, you will be asked to make an appointment and we will call in enough medication to cover you until your appointment (up to 30 days).  - all controlled substances will require a face to face visit to request/refill.  - if you desire your prescriptions to go through a new pharmacy, and have an active script at original pharmacy, you will need to call your pharmacy and have scripts transferred to new pharmacy. This is completed between the pharmacy locations and not by your provider.    Results: If any images or labs were ordered, it can take up to 1 week to get results depending on the test ordered and the lab/facility  running and resulting the test. - Normal or stable results, which do not need further discussion, may be released to your mychart immediately with attached note to you. A call may not be generated for normal results. Please make certain to sign up for mychart. If you have questions on how to activate your mychart you can call the front office.  - If your results need further discussion, our office will attempt to contact you via phone, and if unable to reach you after 2 attempts, we will release your abnormal result to your mychart with instructions.  - All results will be automatically released in mychart after 1 week.  - Your provider will provide you with explanation and instruction on all relevant material in your results. Please keep in mind, results and labs may appear confusing or abnormal to the untrained eye, but it does not mean they are actually abnormal for you personally. If you have any questions about your results that are not covered, or you desire more detailed explanation than what was provided, you should make an appointment with your provider to do so.   Our office handles many outgoing and incoming calls daily. If we have not contacted you within 1 week about your results, please check your mychart to see if there is a message first and if not, then contact our office.  In helping with this matter, you help decrease call volume, and therefore  allow [place] to be able to respond to patients needs more efficiently.   Acute office visits (sick visit):  An acute visit is intended for a new problem and are scheduled in shorter time slots to allow schedule openings for patients with new problems. This is the appropriate visit to discuss a new problem. In order to provide you with excellent quality medical care with proper time for you to explain your problem, have an exam and receive treatment with instructions, these appointments should be limited to one new problem per visit. If you experience a  new problem, in which you desire to be addressed, please make an acute office visit, we save openings on the schedule to accommodate you. Please do not save your new problem for any other type of visit, let us take care of it properly and quickly for you.   Follow up visits:  Depending on your condition(s) your provider will need to see you routinely in order to provide you with quality care and prescribe medication(s). Most chronic conditions (Example: hypertension, Diabetes, depression/anxiety... etc), require visits a couple times a year. Your provider will instruct you on proper follow up for your personal medical conditions and history. Please make certain to make follow up appointments for your condition as instructed. Failing to do so could result in lapse in your medication treatment/refills. If you request a refill, and are overdue to be seen on a condition, we will always provide you with a 30 day script (once) to allow you time to schedule.    Medicare wellness (well visit): - we have a wonderful Nurse Maudie Mercury), that will meet with you and provide you will yearly medicare wellness visits. These visits should occur yearly (can not be scheduled less than 1 calendar year apart) and cover preventive health, immunizations, advance directives and screenings you are entitled to yearly through your medicare benefits. Do not miss out on your entitled benefits, this is when medicare will pay for these benefits to be ordered for you.  These are strongly encouraged by your provider and is the appropriate type of visit to make certain you are up to date with all preventive health benefits. If you have not had your medicare wellness exam in the last 12 months, please make certain to schedule one by calling the office and schedule your medicare wellness with Maudie Mercury as soon as possible.   Yearly physical (well visit):  - Adults are recommended to be seen yearly for physicals. Check with your insurance and date of  your last physical, most insurances require one calendar year between physicals. Physicals include all preventive health topics, screenings, medical exam and labs that are appropriate for gender/age and history. You may have fasting labs needed at this visit. This is a well visit (not a sick visit), new problems should not be covered during this visit (see acute visit).  - Pediatric patients are seen more frequently when they are younger. Your provider will advise you on well child visit timing that is appropriate for your their age. - This is not a medicare wellness visit. Medicare wellness exams do not have an exam portion to the visit. Some medicare companies allow for a physical, some do not allow a yearly physical. If your medicare allows a yearly physical you can schedule the medicare wellness with our nurse Maudie Mercury and have your physical with your provider after, on the same day. Please check with insurance for your full benefits.   Late Policy/No Shows:  - all new  patients should arrive 15-30 minutes earlier than appointment to allow [place] time  to  obtain all personal demographics,  insurance information and for you to complete office paperwork. - All established patients should arrive 10-15 minutes earlier than appointment time to update all information and be checked in .  - In our best efforts to run on time, if you are late for your appointment you will be asked to either reschedule or if able, we will work you back into the schedule. There will be a wait time to work you back in the schedule,  depending on availability.  - If you are unable to make it to your appointment as scheduled, please call 24 hours ahead of time to allow [place] to fill the time slot with someone else who needs to be seen. If you do not cancel your appointment ahead of time, you may be charged a no show fee.

## 2017-08-29 ENCOUNTER — Other Ambulatory Visit: Payer: Self-pay | Admitting: *Deleted

## 2017-08-29 ENCOUNTER — Ambulatory Visit: Payer: Medicare Other | Admitting: Family Medicine

## 2017-08-29 DIAGNOSIS — F418 Other specified anxiety disorders: Secondary | ICD-10-CM

## 2017-08-29 DIAGNOSIS — G479 Sleep disorder, unspecified: Secondary | ICD-10-CM

## 2017-08-29 MED ORDER — ESCITALOPRAM OXALATE 20 MG PO TABS: 20.0000 mg | ORAL_TABLET | Freq: Every day | ORAL | 0 refills | Status: DC

## 2017-08-29 NOTE — Telephone Encounter (Signed)
Patient requesting trazodone refill patient was last seen 07/28/17 for depression and anxiety last refill 07/28/17 for 45 tabs. Please advise.

## 2017-08-30 NOTE — Telephone Encounter (Signed)
Please call pt: We received a refill request for her trazodone from the pharmacy. Please make sure she is requesting this refill. She was to call in to inform us if she wanted the refill, how much she is taking and refill would be refilled for 3 months if desired.

## 2017-08-30 NOTE — Telephone Encounter (Signed)
Called patient she states she did not request refill. She is no longer taking this medication. Per Dr Raoul Pitch medication is discontinued

## 2017-08-31 ENCOUNTER — Other Ambulatory Visit: Payer: Self-pay | Admitting: *Deleted

## 2017-08-31 DIAGNOSIS — F418 Other specified anxiety disorders: Secondary | ICD-10-CM

## 2017-10-04 ENCOUNTER — Ambulatory Visit (INDEPENDENT_AMBULATORY_CARE_PROVIDER_SITE_OTHER): Payer: Medicare Other | Admitting: Family Medicine

## 2017-10-04 ENCOUNTER — Encounter: Payer: Self-pay | Admitting: Family Medicine

## 2017-10-04 VITALS — BP 139/67 | HR 96 | Temp 100.0°F | Resp 20 | Wt 156.2 lb

## 2017-10-04 DIAGNOSIS — R6889 Other general symptoms and signs: Secondary | ICD-10-CM | POA: Diagnosis not present

## 2017-10-04 DIAGNOSIS — R05 Cough: Secondary | ICD-10-CM

## 2017-10-04 DIAGNOSIS — J40 Bronchitis, not specified as acute or chronic: Secondary | ICD-10-CM

## 2017-10-04 DIAGNOSIS — R059 Cough, unspecified: Secondary | ICD-10-CM

## 2017-10-04 LAB — POC INFLUENZA A&B (BINAX/QUICKVUE)
INFLUENZA B, POC: NEGATIVE
Influenza A, POC: NEGATIVE

## 2017-10-04 MED ORDER — AMOXICILLIN-POT CLAVULANATE 875-125 MG PO TABS: 1.0000 | ORAL_TABLET | Freq: Two times a day (BID) | ORAL | 0 refills | Status: DC

## 2017-10-04 MED ORDER — PREDNISONE 20 MG PO TABS
40.0000 mg | ORAL_TABLET | Freq: Every day | ORAL | 0 refills | Status: DC
Start: 2017-10-04 — End: 2017-10-12

## 2017-10-04 MED ORDER — HYDROCODONE-HOMATROPINE 5-1.5 MG/5ML PO SYRP: 5.0000 mL | ORAL_SOLUTION | Freq: Four times a day (QID) | ORAL | 0 refills | Status: DC | PRN

## 2017-10-04 NOTE — Progress Notes (Addendum)
Victoria Bryant , 1946/07/05, 71 y.o., female MRN: [id number] Patient Care Team    Relationship Specialty Notifications Start End  [name], DO PCP - General Family Medicine  06/18/16   [name], MD Consulting Physician Obstetrics and Gynecology  10/06/16   [name], MD Consulting Physician Gastroenterology  10/08/16     Chief Complaint  Patient presents with  . URI    cough,chest congestion x 3 days     Subjective: Pt presents for an OV with complaints of cough of 3 days duration.  Associated symptoms include chest congestion, cough, wheezing, chest tightness, fever of 101F, chills, headache from coughing and sore throat. She denies nausea, vomit, rash or diarrhea. She endorses a decreased appetite but she is drinking plenty of fluids. She reports everyone in her household has been ill. She has not received her flu shot as of yet this year. Pt has tried over-the-counter cold remedies to ease their symptoms.   Depression screen [facility] 2/9 07/28/2017 06/30/2017 06/09/2017 10/06/2016 10/06/2015  Decreased Interest 0 0 3 0 0  Down, Depressed, Hopeless 0 0 2 0 0  PHQ - 2 Score 0 0 5 0 0  Altered sleeping 0 3 0 - -  Tired, decreased energy 3 3 1  - -  Change in appetite 0 0 0 - -  Feeling bad or failure about yourself  0 0 0 - -  Trouble concentrating 0 0 3 - -  Moving slowly or fidgety/restless 1 0 0 - -  Suicidal thoughts 0 0 0 - -  PHQ-9 Score 4 6 9  - -    Allergies  Allergen Reactions  . Clarithromycin   . Doxycycline Hyclate     REACTION: unspecified  . Erythromycin Ethylsuccinate     REACTION: unspecified  . Paroxetine     REACTION: unspecified   Social History  Substance Use Topics  . Smoking status: Former Smoker    Quit date: 12/06/1973  . Smokeless tobacco: Never Used  . Alcohol use No   Past Medical History:  Diagnosis Date  . COLONIC POLYPS, HX OF 04/21/2007  . DEPRESSION 05/26/2009  . Diverticulitis   . GERD 04/08/2008  . Interstitial cystitis    ? per pt: was told by urologist she has this  . 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 MENOPAUSAL SYNDROME 04/21/2007  . NECK PAIN, CHRONIC 04/21/2007   Past Surgical History:  Procedure Laterality Date  . COLONOSCOPY  x 2   First time polyps, second time no polyps: recall 10 yrs (next is approx 2024 per pt report)  . INCISION AND DRAINAGE ABSCESS ANAL    . TONSILLECTOMY    . VAGINAL HYSTERECTOMY     Nonmalignant reason.  Ovaries still in.   Family History  Problem Relation Age of Onset  . Bladder Cancer Mother   . Lung cancer Mother   . Breast cancer Paternal Aunt    Allergies as of 10/04/2017      Reactions   Clarithromycin    Doxycycline Hyclate    REACTION: unspecified   Erythromycin Ethylsuccinate    REACTION: unspecified   Paroxetine    REACTION: unspecified      Medication List       Accurate as of 10/04/17  4:55 PM. Always use your most recent med list.          ALPRAZolam 0.5 MG tablet Commonly known as:  XANAX Take 1 tablet (0.5 mg total) by mouth daily.   amoxicillin-clavulanate  875-125 MG tablet Commonly known as:  AUGMENTIN Take 1 tablet by mouth 2 (two) times daily.   escitalopram 20 MG tablet Commonly known as:  LEXAPRO Take 1 tablet (20 mg total) by mouth at bedtime.   fluticasone 50 MCG/ACT nasal spray Commonly known as:  FLONASE Place 1 spray into both nostrils daily.   HYDROcodone-homatropine 5-1.5 MG/5ML syrup Commonly known as:  HYCODAN Take 5 mLs by mouth every 6 (six) hours as needed for cough.   hyoscyamine 0.125 MG SL tablet Commonly known as:  LEVSIN/SL Place 1 tablet (0.125 mg total) under the tongue every 6 (six) hours as needed.   meloxicam 15 MG tablet Commonly known as:  MOBIC Take 1 tablet (15 mg total) by mouth daily.   pantoprazole 40 MG tablet Commonly known as:  PROTONIX Take 1 tablet (40 mg total) by mouth daily.   predniSONE 20 MG tablet Commonly known as:  DELTASONE Take 2 tablets (40 mg total) by  mouth daily with breakfast.   tolterodine 4 MG 24 hr capsule Commonly known as:  DETROL LA Take 4 mg by mouth daily as needed.       All past medical history, surgical history, allergies, family history, immunizations andmedications were updated in the EMR today and reviewed under the history and medication portions of their EMR.     ROS: Negative, with the exception of above mentioned in HPI   Objective:  BP 139/67 (BP Location: Left Arm, Patient Position: Sitting, Cuff Size: Normal)   Pulse 96   Temp 100 F (37.8 C)   Resp 20   Wt 156 lb 4 oz (70.9 kg)   SpO2 97%   BMI 27.68 kg/m  Body mass index is 27.68 kg/m. Gen: febrile. No acute distress. Nontoxic in appearance, well developed, well nourished. Appears very tired. HENT: AT. . Bilateral TM visualized without erythema or bulging. MMM, no oral lesions. Bilateral nares with erythema and drainage. Throat with mild erythema, no exudates. Barking cough present. Hoarseness present. Eyes:Pupils Equal Round Reactive to light, Extraocular movements intact,  Conjunctiva without redness, discharge or icterus. Neck/lymp/endocrine: Supple, no lymphadenopathy CV: RRR no murmur, no edema Chest: CTAB, no wheeze or crackles. Good air movement, normal resp effort.  Abd: Soft. NTND. BS present.  Skin: No rashes, purpura or petechiae.  Neuro:  Normal gait. PERLA. EOMi. Alert. Oriented x3   No exam data present No results found. Results for orders placed or performed in visit on 10/04/17 (from the past 24 hour(s))  POC Influenza A&B (Binax test)     Status: Normal   Collection Time: 10/04/17 10:39 AM  Result Value Ref Range   Influenza A, POC Negative Negative   Influenza B, POC Negative Negative    Assessment/Plan: Victoria Bryant is a 71 y.o. female present for OV for  Bronchitis Cough - Rest, hydrate, Mucinex DM. - Influenza negative - Augmentin and prednisone burst prescribed - Hycodan cough syrup  prescribed, no refills,  educated on controlled substance and sedation properties. - Follow-up 1-2 weeks, sooner if worsening.   Reviewed expectations re: course of current medical issues.  Discussed self-management of symptoms.  Outlined signs and symptoms indicating need for more acute intervention.  Patient verbalized understanding and all questions were answered.  Patient received an After-Visit Summary.    Orders Placed This Encounter  Procedures  . POC Influenza A&B (Binax test)     Note is dictated utilizing voice recognition software. Although note has been proof read prior to signing, occasional  typographical errors still can be missed. If any questions arise, please do not hesitate to call for verification.   electronically signed by:  Howard Pouch, DO  [place]

## 2017-10-04 NOTE — Patient Instructions (Signed)
I have prescribed Augmentin every 12 hours for 10 days, take with food.  Prednisone also prescribed for 5 days, also take with food.  Rest and HYDRATE.  Tylenol or advil for headache/fever.  Hycodan cough syrup prescribed, caution on sedation. This is a controlled substance and no refills will be be provided.    Your flu test is negative.    Acute Bronchitis, Adult Acute bronchitis is when air tubes (bronchi) in the lungs suddenly get swollen. The condition can make it hard to breathe. It can also cause these symptoms:  A cough.  Coughing up clear, yellow, or green mucus.  Wheezing.  Chest congestion.  Shortness of breath.  A fever.  Body aches.  Chills.  A sore throat.  Follow these instructions at home: Medicines  Take over-the-counter and prescription medicines only as told by your doctor.  If you were prescribed an antibiotic medicine, take it as told by your doctor. Do not stop taking the antibiotic even if you start to feel better. General instructions  Rest.  Drink enough fluids to keep your pee (urine) clear or pale yellow.  Avoid smoking and secondhand smoke. If you smoke and you need help quitting, ask your doctor. Quitting will help your lungs heal faster.  Use an inhaler, cool mist vaporizer, or humidifier as told by your doctor.  Keep all follow-up visits as told by your doctor. This is important. How is this prevented? To lower your risk of getting this condition again:  Wash your hands often with soap and water. If you cannot use soap and water, use hand sanitizer.  Avoid contact with people who have cold symptoms.  Try not to touch your hands to your mouth, nose, or eyes.  Make sure to get the flu shot every year.  Contact a doctor if:  Your symptoms do not get better in 2 weeks. Get help right away if:  You cough up blood.  You have chest pain.  You have very bad shortness of breath.  You become dehydrated.  You faint (pass out)  or keep feeling like you are going to pass out.  You keep throwing up (vomiting).  You have a very bad headache.  Your fever or chills gets worse. This information is not intended to replace advice given to you by your health care provider. Make sure you discuss any questions you have with your health care provider. Document Released: 05/10/2008 Document Revised: 06/30/2016 Document Reviewed: 05/12/2016 Elsevier Interactive Patient Education  2017 [employer].

## 2017-10-10 ENCOUNTER — Ambulatory Visit: Payer: Medicare Other

## 2017-10-12 ENCOUNTER — Ambulatory Visit (INDEPENDENT_AMBULATORY_CARE_PROVIDER_SITE_OTHER): Payer: Medicare Other | Admitting: Family Medicine

## 2017-10-12 ENCOUNTER — Telehealth: Payer: Self-pay | Admitting: Family Medicine

## 2017-10-12 ENCOUNTER — Ambulatory Visit (HOSPITAL_BASED_OUTPATIENT_CLINIC_OR_DEPARTMENT_OTHER)
Admission: RE | Admit: 2017-10-12 | Discharge: 2017-10-12 | Disposition: A | Discharge status: Home / Self Care | Payer: Medicare Other | Source: Ambulatory Visit | Attending: Family Medicine | Admitting: Family Medicine

## 2017-10-12 ENCOUNTER — Encounter: Payer: Self-pay | Admitting: Family Medicine

## 2017-10-12 VITALS — BP 125/76 | HR 86 | Temp 99.1°F | Resp 20 | Wt 152.2 lb

## 2017-10-12 DIAGNOSIS — Z87891 Personal history of nicotine dependence: Secondary | ICD-10-CM | POA: Insufficient documentation

## 2017-10-12 DIAGNOSIS — J40 Bronchitis, not specified as acute or chronic: Secondary | ICD-10-CM | POA: Diagnosis not present

## 2017-10-12 DIAGNOSIS — R05 Cough: Secondary | ICD-10-CM

## 2017-10-12 DIAGNOSIS — I700 Atherosclerosis of aorta: Secondary | ICD-10-CM | POA: Diagnosis not present

## 2017-10-12 DIAGNOSIS — R509 Fever, unspecified: Secondary | ICD-10-CM | POA: Diagnosis not present

## 2017-10-12 DIAGNOSIS — R059 Cough, unspecified: Secondary | ICD-10-CM

## 2017-10-12 LAB — CBC WITH DIFFERENTIAL/PLATELET
BASOS ABS: 0.1 10*3/uL (ref 0.0–0.1)
Basophils Relative: 0.6 % (ref 0.0–3.0)
EOS ABS: 0.2 10*3/uL (ref 0.0–0.7)
EOS PCT: 1 % (ref 0.0–5.0)
HCT: 42.2 % (ref 36.0–46.0)
Hemoglobin: 13.9 g/dL (ref 12.0–15.0)
LYMPHS ABS: 2.9 10*3/uL (ref 0.7–4.0)
Lymphocytes Relative: 19.8 % (ref 12.0–46.0)
MCHC: 32.9 g/dL (ref 30.0–36.0)
MCV: 93.2 fl (ref 78.0–100.0)
MONO ABS: 1.3 10*3/uL — AB (ref 0.1–1.0)
Monocytes Relative: 9.2 % (ref 3.0–12.0)
NEUTROS PCT: 69.4 % (ref 43.0–77.0)
Neutro Abs: 10 10*3/uL — ABNORMAL HIGH (ref 1.4–7.7)
Platelets: 350 10*3/uL (ref 150.0–400.0)
RBC: 4.53 Mil/uL (ref 3.87–5.11)
RDW: 13.1 % (ref 11.5–15.5)
WBC: 14.5 10*3/uL — ABNORMAL HIGH (ref 4.0–10.5)

## 2017-10-12 MED ORDER — AZITHROMYCIN 250 MG PO TABS
ORAL_TABLET | ORAL | 0 refills | Status: DC
Start: 2017-10-12 — End: 2017-10-21

## 2017-10-12 MED ORDER — CEFTRIAXONE SODIUM 1 G IJ SOLR
1.0000 g | Freq: Once | INTRAMUSCULAR | Status: AC
  Administered 2017-10-12: 1 g via INTRAMUSCULAR

## 2017-10-12 NOTE — Telephone Encounter (Signed)
Please call pt: Her xray did not show signs of pneumonia. -  I have called in a Z-pack for her, for extra coverage for the bronchitis.

## 2017-10-12 NOTE — Progress Notes (Signed)
Victoria Bryant , 09-26-1946, 71 y.o., female MRN: [id number] Patient Care Team    Relationship Specialty Notifications Start End  [name], DO PCP - General Family Medicine  06/18/16   [name], MD Consulting Physician Obstetrics and Gynecology  10/06/16   [name], MD Consulting Physician Gastroenterology  10/08/16     Chief Complaint  Patient presents with  . URI    congestion,cough,ear pain     Subjective:  Patient presents today with continued symptoms of cough congestion and ear pain. She states she's been very fatigued despite completion of Augmentin today. She has still had mild fevers and chills. She's been taking Tylenol approximately every 4 hours. She reports many people in her family are also still ill. Prior note: Pt presents for an OV with complaints of cough of 3 days duration.  Associated symptoms include chest congestion, cough, wheezing, chest tightness, fever of 101F, chills, headache from coughing and sore throat. She denies nausea, vomit, rash or diarrhea. She endorses a decreased appetite but she is drinking plenty of fluids. She reports everyone in her household has been ill. She has not received her flu shot as of yet this year. Pt has tried over-the-counter cold remedies to ease their symptoms.   Depression screen [facility] 2/9 07/28/2017 06/30/2017 06/09/2017 10/06/2016 10/06/2015  Decreased Interest 0 0 3 0 0  Down, Depressed, Hopeless 0 0 2 0 0  PHQ - 2 Score 0 0 5 0 0  Altered sleeping 0 3 0 - -  Tired, decreased energy 3 3 1  - -  Change in appetite 0 0 0 - -  Feeling bad or failure about yourself  0 0 0 - -  Trouble concentrating 0 0 3 - -  Moving slowly or fidgety/restless 1 0 0 - -  Suicidal thoughts 0 0 0 - -  PHQ-9 Score 4 6 9  - -    Allergies  Allergen Reactions  . Clarithromycin   . Doxycycline Hyclate     REACTION: unspecified  . Erythromycin Ethylsuccinate     REACTION: unspecified  . Paroxetine     REACTION: unspecified    Social History   Tobacco Use  . Smoking status: Former Smoker    Last attempt to quit: 12/06/1973    Years since quitting: 43.8  . Smokeless tobacco: Never Used  Substance Use Topics  . Alcohol use: No   Past Medical History:  Diagnosis Date  . COLONIC POLYPS, HX OF 04/21/2007  . DEPRESSION 05/26/2009  . Diverticulitis   . GERD 04/08/2008  . Interstitial cystitis    ? per pt: was told by urologist she has this  . 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 MENOPAUSAL SYNDROME 04/21/2007  . NECK PAIN, CHRONIC 04/21/2007   Past Surgical History:  Procedure Laterality Date  . COLONOSCOPY  x 2   First time polyps, second time no polyps: recall 10 yrs (next is approx 2024 per pt report)  . INCISION AND DRAINAGE ABSCESS ANAL    . TONSILLECTOMY    . VAGINAL HYSTERECTOMY     Nonmalignant reason.  Ovaries still in.   Family History  Problem Relation Age of Onset  . Bladder Cancer Mother   . Lung cancer Mother   . Breast cancer Paternal Aunt    Allergies as of 10/12/2017      Reactions   Clarithromycin    Doxycycline Hyclate    REACTION: unspecified   Erythromycin Ethylsuccinate    REACTION: unspecified  Paroxetine    REACTION: unspecified      Medication List        Accurate as of 10/12/17  8:55 AM. Always use your most recent med list.          ALPRAZolam 0.5 MG tablet Commonly known as:  XANAX Take 1 tablet (0.5 mg total) by mouth daily.   amoxicillin-clavulanate 875-125 MG tablet Commonly known as:  AUGMENTIN Take 1 tablet by mouth 2 (two) times daily.   fluticasone 50 MCG/ACT nasal spray Commonly known as:  FLONASE Place 1 spray into both nostrils daily.   HYDROcodone-homatropine 5-1.5 MG/5ML syrup Commonly known as:  HYCODAN Take 5 mLs by mouth every 6 (six) hours as needed for cough.   hyoscyamine 0.125 MG SL tablet Commonly known as:  LEVSIN/SL Place 1 tablet (0.125 mg total) under the tongue every 6 (six) hours as needed.   meloxicam 15 MG  tablet Commonly known as:  MOBIC Take 1 tablet (15 mg total) by mouth daily.   pantoprazole 40 MG tablet Commonly known as:  PROTONIX Take 1 tablet (40 mg total) by mouth daily.   tolterodine 4 MG 24 hr capsule Commonly known as:  DETROL LA Take 4 mg by mouth daily as needed.       All past medical history, surgical history, allergies, family history, immunizations andmedications were updated in the EMR today and reviewed under the history and medication portions of their EMR.     ROS: Negative, with the exception of above mentioned in HPI   Objective:  BP 125/76 (BP Location: Right Arm, Patient Position: Sitting, Cuff Size: Normal)   Pulse 86   Temp 99.1 F (37.3 C)   Resp 20   Wt 152 lb 4 oz (69.1 kg)   SpO2 97%   BMI 26.97 kg/m  Body mass index is 26.97 kg/m.  Gen: febrile. No acute distress. Nontoxic in appearance, appears fatigued. Very pleasant Caucasian female. HENT: AT. [employer]. Bilateral TM visualized and normal in appearance. MMM. Bilateral nares without erythema, swelling or drainage. Throat without erythema or exudates. Cough still present. Hoarseness present. Eyes:Pupils Equal Round Reactive to light, Extraocular movements intact,  Conjunctiva without redness, discharge or icterus. Neck/lymp/endocrine: Supple, no lymphadenopathy CV: RRR  Chest: Crackles present bilateral bases, no wheezing. Normal work of breath. Abd: Soft. NTND. BS present.  Skin: No rashes, purpura or petechiae.  Neuro:  Normal gait. PERLA. EOMi. Alert. Orientedx3    No exam data present No results found. No results found for this or any previous visit (from the past 24 hour(s)).  Assessment/Plan: [name] is a 71 y.o. female present for OV for  Bronchitis Cough/fever - Concern for early pneumonia considering she has not improved, and fever/chills and fatigue have remained. She is still coughing, although this has improved mildly. - Rest, hydrate, Mucinex DM. Patient to be  cautious with overuse of Tylenol. Make sure never to go above the recommended dose on the bottle. - Her influenza was negative last visit. She completed Augmentin and prednisone burst. - Chest x-ray and CBC ordered today. - Rocephin IM provided today. Azithromycin prescribed. Follow-up 2 weeks  Reviewed expectations re: course of current medical issues.  Discussed self-management of symptoms.  Outlined signs and symptoms indicating need for more acute intervention.  Patient verbalized understanding and all questions were answered.  Patient received an After-Visit Summary.    No orders of the defined types were placed in this encounter.    Note is dictated utilizing voice  recognition software. Although note has been proof read prior to signing, occasional typographical errors still can be missed. If any questions arise, please do not hesitate to call for verification.   electronically signed by:  Howard Pouch, DO  [place]

## 2017-10-12 NOTE — Patient Instructions (Signed)
Rocephin shot today.  Rest, hydrate.  Chest xray today to make sure not pneumonia.   Community-Acquired Pneumonia, Adult Pneumonia is an infection of the lungs. One type of pneumonia can happen while a person is in a hospital. A different type can happen when a person is not in a hospital (community-acquired pneumonia). It is easy for this kind to spread from person to person. It can spread to you if you breathe near an infected person who coughs or sneezes. Some symptoms include:  A dry cough.  A wet (productive) cough.  Fever.  Sweating.  Chest pain.  Follow these instructions at home:  Take over-the-counter and prescription medicines only as told by your doctor. ? Only take cough medicine if you are losing sleep. ? If you were prescribed an antibiotic medicine, take it as told by your doctor. Do not stop taking the antibiotic even if you start to feel better.  Sleep with your head and neck raised (elevated). You can do this by putting a few pillows under your head, or you can sleep in a recliner.  Do not use tobacco products. These include cigarettes, chewing tobacco, and e-cigarettes. If you need help quitting, ask your doctor.  Drink enough water to keep your pee (urine) clear or pale yellow. A shot (vaccine) can help prevent pneumonia. Shots are often suggested for:  People older than 72 years of age.  People older than 72 years of age: ? Who are having cancer treatment. ? Who have long-term (chronic) lung disease. ? Who have problems with their body's defense system (immune system).  You may also prevent pneumonia if you take these actions:  Get the flu (influenza) shot every year.  Go to the dentist as often as told.  Wash your hands often. If soap and water are not available, use hand sanitizer.  Contact a doctor if:  You have a fever.  You lose sleep because your cough medicine does not help. Get help right away if:  You are short of breath and it gets  worse.  You have more chest pain.  Your sickness gets worse. This is very serious if: ? You are an older adult. ? Your body's defense system is weak.  You cough up blood. This information is not intended to replace advice given to you by your health care provider. Make sure you discuss any questions you have with your health care provider. Document Released: 05/10/2008 Document Revised: 04/29/2016 Document Reviewed: 03/19/2015 Elsevier Interactive Patient Education  [employer].

## 2017-10-12 NOTE — Telephone Encounter (Signed)
Spoke with patient reviewed lab results and instructions. Patient verbalized understanding. 

## 2017-10-14 ENCOUNTER — Encounter: Payer: Self-pay | Admitting: Family Medicine

## 2017-10-21 ENCOUNTER — Encounter: Payer: Self-pay | Admitting: Family Medicine

## 2017-10-21 ENCOUNTER — Ambulatory Visit (INDEPENDENT_AMBULATORY_CARE_PROVIDER_SITE_OTHER): Payer: Medicare Other | Admitting: Family Medicine

## 2017-10-21 ENCOUNTER — Ambulatory Visit: Payer: Medicare Other

## 2017-10-21 ENCOUNTER — Other Ambulatory Visit: Payer: Self-pay

## 2017-10-21 VITALS — BP 136/70 | HR 69 | Temp 98.4°F | Resp 18 | Ht 63.0 in | Wt 154.4 lb

## 2017-10-21 DIAGNOSIS — J40 Bronchitis, not specified as acute or chronic: Secondary | ICD-10-CM

## 2017-10-21 DIAGNOSIS — Z0000 Encounter for general adult medical examination without abnormal findings: Secondary | ICD-10-CM

## 2017-10-21 DIAGNOSIS — F329 Major depressive disorder, single episode, unspecified: Secondary | ICD-10-CM

## 2017-10-21 DIAGNOSIS — Z23 Encounter for immunization: Secondary | ICD-10-CM

## 2017-10-21 MED ORDER — ZOSTER VAC RECOMB ADJUVANTED 50 MCG/0.5ML IM SUSR: 0.5000 mL | Freq: Once | INTRAMUSCULAR | 1 refills | Status: AC

## 2017-10-21 MED ORDER — BUPROPION HCL 75 MG PO TABS: 75.0000 mg | ORAL_TABLET | Freq: Two times a day (BID) | ORAL | 0 refills | Status: DC

## 2017-10-21 NOTE — Progress Notes (Addendum)
Subjective:   Victoria Bryant is a 71 y.o. female who presents for Medicare Annual (Subsequent) preventive examination.  Review of Systems:  No ROS.  Medicare Wellness Visit. Additional risk factors are reflected in the social history.  Cardiac Risk Factors include: advanced age (>56men, >68 women) Sleep patterns: Sleeps 8 hours. Feels rested.  Home Safety/Smoke Alarms: Feels safe in home. Smoke alarms in place.  Living environment; residence and Firearm Safety: Lives with husband and family in 2 story home.  Seat Belt Safety/Bike Helmet: Wears seat belt.   Female:   Pap-N/A      Mammo-09/27/2016, [facility]. Scheduled for 11/2017.      Dexa scan-09/27/2016, normal. Will confirm with [facility].     CCS-Colonoscopy 04/19/2013, normal. Recall 5 years.      Objective:     Vitals: BP 136/70 (BP Location: Left Arm, Patient Position: Sitting, Cuff Size: Normal)   Pulse 69   Temp 98.4 F (36.9 C) (Oral)   Resp 18   Ht 5\' 3"  (1.6 m)   Wt 154 lb 6.4 oz (70 kg)   SpO2 97%   BMI 27.35 kg/m   Body mass index is 27.35 kg/m.   Tobacco Social History   Tobacco Use  Smoking Status Former Smoker  . Last attempt to quit: 12/06/1973  . Years since quitting: 43.9  Smokeless Tobacco Never Used     Counseling given: Not Answered   Past Medical History:  Diagnosis Date  . COLONIC POLYPS, HX OF 04/21/2007  . DEPRESSION 05/26/2009  . Diverticulitis   . GERD 04/08/2008  . Interstitial cystitis    ? per pt: was told by urologist she has this  . 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 MENOPAUSAL SYNDROME 04/21/2007  . NECK PAIN, CHRONIC 04/21/2007   Past Surgical History:  Procedure Laterality Date  . COLONOSCOPY  x 2   First time polyps, second time no polyps: recall 10 yrs (next is approx 2024 per pt report)  . INCISION AND DRAINAGE ABSCESS ANAL    . TONSILLECTOMY    . VAGINAL HYSTERECTOMY     Nonmalignant reason.  Ovaries still in.   Family History  Problem  Relation Age of Onset  . Bladder Cancer Mother   . Lung cancer Mother   . Breast cancer Paternal Aunt   . ALS Daughter    Social History   Substance and Sexual Activity  Sexual Activity Not on file    Outpatient Encounter Medications as of 10/21/2017  Medication Sig  . ALPRAZolam (XANAX) 0.5 MG tablet Take 1 tablet (0.5 mg total) by mouth daily.  . Ascorbic Acid (VITAMIN C PO) Take by mouth.  . fluticasone (FLONASE) 50 MCG/ACT nasal spray Place 1 spray into both nostrils daily.  . hyoscyamine (LEVSIN/SL) 0.125 MG SL tablet Place 1 tablet (0.125 mg total) under the tongue every 6 (six) hours as needed.  . meloxicam (MOBIC) 15 MG tablet Take 1 tablet (15 mg total) by mouth daily. (Patient taking differently: Take 15 mg daily as needed by mouth. )  . Multiple Vitamins-Minerals (ZINC PO) Take by mouth.  . Nutritional Supplements (JUICE PLUS FIBRE PO) Take by mouth.  . pantoprazole (PROTONIX) 40 MG tablet Take 1 tablet (40 mg total) by mouth daily.  Marland Kitchen tolterodine (DETROL LA) 4 MG 24 hr capsule Take 4 mg by mouth daily as needed.  . TURMERIC PO Take by mouth.  . Zoster Vaccine Adjuvanted [facility]) injection Inject 0.5 mLs once for 1 dose into  the muscle.  . [DISCONTINUED] amoxicillin-clavulanate (AUGMENTIN) 875-125 MG tablet Take 1 tablet by mouth 2 (two) times daily.  . [DISCONTINUED] azithromycin (ZITHROMAX) 250 MG tablet 500 mg day 1, then 250 mg QD  . [DISCONTINUED] HYDROcodone-homatropine (HYCODAN) 5-1.5 MG/5ML syrup Take 5 mLs by mouth every 6 (six) hours as needed for cough.   No facility-administered encounter medications on file as of 10/21/2017.     Activities of Daily Living In your present state of health, do you have any difficulty performing the following activities: 10/21/2017  Hearing? N  Vision? N  Difficulty concentrating or making decisions? N  Walking or climbing stairs? N  Dressing or bathing? N  Doing errands, shopping? N  Preparing Food and eating ? N  Using  the Toilet? N  In the past six months, have you accidently leaked urine? N  Do you have problems with loss of bowel control? N  Managing your Medications? N  Managing your Finances? N  Housekeeping or managing your Housekeeping? N  Some recent data might be hidden    Patient Care Team: [name], DO as PCP - General (Family Medicine) [name], MD as Consulting Physician (Obstetrics and Gynecology) [name], MD as Consulting Physician (Gastroenterology)    Assessment:    Physical assessment deferred to PCP.  Exercise Activities and Dietary recommendations Current Exercise Habits: The patient does not participate in regular exercise at present, Exercise limited by: None identified   Diet (meal preparation, eat out, water intake, caffeinated beverages, dairy products, fruits and vegetables): Drinks water.   Breakfast: Boiled eggs, sandwich Lunch: fast food Dinner: lean protein, vegetable    Goals    . Increase physical activity     Increase activity.       Fall Risk Fall Risk  10/21/2017 10/12/2017 10/06/2016 10/06/2015 10/06/2015  Falls in the past year? No No No No No   Depression Screen PHQ 2/9 Scores 10/21/2017 07/28/2017 06/30/2017 06/09/2017  PHQ - 2 Score 2 0 0 5  PHQ- 9 Score 5 4 6 9      Cognitive Function MMSE - Mini Mental State Exam 10/21/2017  Orientation to time 5  Orientation to Place 5  Registration 3  Attention/ Calculation 5  Recall 3  Language- name 2 objects 2  Language- repeat 1  Language- follow 3 step command 3  Language- read & follow direction 1  Write a sentence 1  Copy design 1  Total score 30        Immunization History  Administered Date(s) Administered  . Influenza Split 09/20/2011, 09/25/2012  . Influenza Whole 08/27/2010  . Influenza, High Dose Seasonal PF 10/06/2015  . Influenza,inj,Quad PF,6+ Mos 09/26/2013  . Influenza-Unspecified 09/17/2014  . Pneumococcal Conjugate-13 10/06/2015  . Pneumococcal  Polysaccharide-23 09/20/2011  . Td 05/26/2009  . Zoster 09/25/2012   Screening Tests Health Maintenance  Topic Date Due  . INFLUENZA VACCINE  11/01/2017 (Originally 07/06/2017)  . COLONOSCOPY  04/19/2018  . MAMMOGRAM  09/27/2018  . TETANUS/TDAP  05/27/2019  . DEXA SCAN  Completed  . Hepatitis C Screening  Completed  . PNA vac Low Risk Adult  Completed      Plan:     Shingles vaccine at pharmacy.   I will call you regarding bone scan.   Continue doing brain stimulating activities (puzzles, reading, adult coloring books, staying active) to keep memory sharp.   I have personally reviewed and noted the following in the patient's chart:   . Medical and social history .  Use of alcohol, tobacco or illicit drugs  . Current medications and supplements . Functional ability and status . Nutritional status . Physical activity . Advanced directives . List of other physicians . Hospitalizations, surgeries, and ER visits in previous 12 months . Vitals . Screenings to include cognitive, depression, and falls . Referrals and appointments  In addition, I have reviewed and discussed with patient certain preventive protocols, quality metrics, and best practice recommendations. A written personalized care plan for preventive services as well as general preventive health recommendations were provided to patient.     Gerilyn Nestle, RN  10/21/2017   PCP Notes: -Mammogram scheduled for 11/2017 -Trying to determine last DEXA, will contact pt with f/u -Shingrix Rx sent to pharmacy  Medical screening examination/treatment/procedure(s) were performed by non-physician practitioner and as supervising physician I was immediately available for consultation/collaboration.  I agree with above assessment and plan.  Electronically Signed by: Howard Pouch, DO [facility]

## 2017-10-21 NOTE — Patient Instructions (Addendum)
Shingles vaccine at pharmacy.   I will call you regarding bone scan.   Continue doing brain stimulating activities (puzzles, reading, adult coloring books, staying active) to keep memory sharp.   Health Maintenance, Female Adopting a healthy lifestyle and getting preventive care can go a long way to promote health and wellness. Talk with your health care provider about what schedule of regular examinations is right for you. This is a good chance for you to check in with your provider about disease prevention and staying healthy. In between checkups, there are plenty of things you can do on your own. Experts have done a lot of research about which lifestyle changes and preventive measures are most likely to keep you healthy. Ask your health care provider for more information. Weight and diet Eat a healthy diet  Be sure to include plenty of vegetables, fruits, low-fat dairy products, and lean protein.  Do not eat a lot of foods high in solid fats, added sugars, or salt.  Get regular exercise. This is one of the most important things you can do for your health. ? Most adults should exercise for at least 150 minutes each week. The exercise should increase your heart rate and make you sweat (moderate-intensity exercise). ? Most adults should also do strengthening exercises at least twice a week. This is in addition to the moderate-intensity exercise.  Maintain a healthy weight  Body mass index (BMI) is a measurement that can be used to identify possible weight problems. It estimates body fat based on height and weight. Your health care provider can help determine your BMI and help you achieve or maintain a healthy weight.  For females 5 years of age and older: ? A BMI below 18.5 is considered underweight. ? A BMI of 18.5 to 24.9 is normal. ? A BMI of 25 to 29.9 is considered overweight. ? A BMI of 30 and above is considered obese.  Watch levels of cholesterol and blood lipids  You should  start having your blood tested for lipids and cholesterol at 72 years of age, then have this test every 5 years.  You may need to have your cholesterol levels checked more often if: ? Your lipid or cholesterol levels are high. ? You are older than 72 years of age. ? You are at high risk for heart disease.  Cancer screening Lung Cancer  Lung cancer screening is recommended for adults 89-43 years old who are at high risk for lung cancer because of a history of smoking.  A yearly low-dose CT scan of the lungs is recommended for people who: ? Currently smoke. ? Have quit within the past 15 years. ? Have at least a 30-pack-year history of smoking. A pack year is smoking an average of one pack of cigarettes a day for 1 year.  Yearly screening should continue until it has been 15 years since you quit.  Yearly screening should stop if you develop a health problem that would prevent you from having lung cancer treatment.  Breast Cancer  Practice breast self-awareness. This means understanding how your breasts normally appear and feel.  It also means doing regular breast self-exams. Let your health care provider know about any changes, no matter how small.  If you are in your 20s or 30s, you should have a clinical breast exam (CBE) by a health care provider every 1-3 years as part of a regular health exam.  If you are 22 or older, have a CBE every year. Also consider  having a breast X-ray (mammogram) every year.  If you have a family history of breast cancer, talk to your health care provider about genetic screening.  If you are at high risk for breast cancer, talk to your health care provider about having an MRI and a mammogram every year.  Breast cancer gene (BRCA) assessment is recommended for women who have family members with BRCA-related cancers. BRCA-related cancers include: ? Breast. ? Ovarian. ? Tubal. ? Peritoneal cancers.  Results of the assessment will determine the need  for genetic counseling and BRCA1 and BRCA2 testing.  Cervical Cancer Your health care provider may recommend that you be screened regularly for cancer of the pelvic organs (ovaries, uterus, and vagina). This screening involves a pelvic examination, including checking for microscopic changes to the surface of your cervix (Pap test). You may be encouraged to have this screening done every 3 years, beginning at age 67.  For women ages 47-65, health care providers may recommend pelvic exams and Pap testing every 3 years, or they may recommend the Pap and pelvic exam, combined with testing for human papilloma virus (HPV), every 5 years. Some types of HPV increase your risk of cervical cancer. Testing for HPV may also be done on women of any age with unclear Pap test results.  Other health care providers may not recommend any screening for nonpregnant women who are considered low risk for pelvic cancer and who do not have symptoms. Ask your health care provider if a screening pelvic exam is right for you.  If you have had past treatment for cervical cancer or a condition that could lead to cancer, you need Pap tests and screening for cancer for at least 20 years after your treatment. If Pap tests have been discontinued, your risk factors (such as having a new sexual partner) need to be reassessed to determine if screening should resume. Some women have medical problems that increase the chance of getting cervical cancer. In these cases, your health care provider may recommend more frequent screening and Pap tests.  Colorectal Cancer  This type of cancer can be detected and often prevented.  Routine colorectal cancer screening usually begins at 72 years of age and continues through 72 years of age.  Your health care provider may recommend screening at an earlier age if you have risk factors for colon cancer.  Your health care provider may also recommend using home test kits to check for hidden blood in  the stool.  A small camera at the end of a tube can be used to examine your colon directly (sigmoidoscopy or colonoscopy). This is done to check for the earliest forms of colorectal cancer.  Routine screening usually begins at age 46.  Direct examination of the colon should be repeated every 5-10 years through 72 years of age. However, you may need to be screened more often if early forms of precancerous polyps or small growths are found.  Skin Cancer  Check your skin from head to toe regularly.  Tell your health care provider about any new moles or changes in moles, especially if there is a change in a mole's shape or color.  Also tell your health care provider if you have a mole that is larger than the size of a pencil eraser.  Always use sunscreen. Apply sunscreen liberally and repeatedly throughout the day.  Protect yourself by wearing long sleeves, pants, a wide-brimmed hat, and sunglasses whenever you are outside.  Heart disease, diabetes, and high blood pressure  High blood pressure causes heart disease and increases the risk of stroke. High blood pressure is more likely to develop in: ? People who have blood pressure in the high end of the normal range (130-139/85-89 mm Hg). ? People who are overweight or obese. ? People who are African American.  If you are 14-51 years of age, have your blood pressure checked every 3-5 years. If you are 50 years of age or older, have your blood pressure checked every year. You should have your blood pressure measured twice-once when you are at a hospital or clinic, and once when you are not at a hospital or clinic. Record the average of the two measurements. To check your blood pressure when you are not at a hospital or clinic, you can use: ? An automated blood pressure machine at a pharmacy. ? A home blood pressure monitor.  If you are between 6 years and 19 years old, ask your health care provider if you should take aspirin to prevent  strokes.  Have regular diabetes screenings. This involves taking a blood sample to check your fasting blood sugar level. ? If you are at a normal weight and have a low risk for diabetes, have this test once every three years after 72 years of age. ? If you are overweight and have a high risk for diabetes, consider being tested at a younger age or more often. Preventing infection Hepatitis B  If you have a higher risk for hepatitis B, you should be screened for this virus. You are considered at high risk for hepatitis B if: ? You were born in a country where hepatitis B is common. Ask your health care provider which countries are considered high risk. ? Your parents were born in a high-risk country, and you have not been immunized against hepatitis B (hepatitis B vaccine). ? You have HIV or AIDS. ? You use needles to inject street drugs. ? You live with someone who has hepatitis B. ? You have had sex with someone who has hepatitis B. ? You get hemodialysis treatment. ? You take certain medicines for conditions, including cancer, organ transplantation, and autoimmune conditions.  Hepatitis C  Blood testing is recommended for: ? Everyone born from [birth year] through 1966. ? Anyone with known risk factors for hepatitis C.  Sexually transmitted infections (STIs)  You should be screened for sexually transmitted infections (STIs) including gonorrhea and chlamydia if: ? You are sexually active and are younger than 72 years of age. ? You are older than 72 years of age and your health care provider tells you that you are at risk for this type of infection. ? Your sexual activity has changed since you were last screened and you are at an increased risk for chlamydia or gonorrhea. Ask your health care provider if you are at risk.  If you do not have HIV, but are at risk, it may be recommended that you take a prescription medicine daily to prevent HIV infection. This is called pre-exposure prophylaxis  (PrEP). You are considered at risk if: ? You are sexually active and do not regularly use condoms or know the HIV status of your partner(s). ? You take drugs by injection. ? You are sexually active with a partner who has HIV.  Talk with your health care provider about whether you are at high risk of being infected with HIV. If you choose to begin PrEP, you should first be tested for HIV. You should then be tested every 3 months  for as long as you are taking PrEP. Pregnancy  If you are premenopausal and you may become pregnant, ask your health care provider about preconception counseling.  If you may become pregnant, take 400 to 800 micrograms (mcg) of folic acid every day.  If you want to prevent pregnancy, talk to your health care provider about birth control (contraception). Osteoporosis and menopause  Osteoporosis is a disease in which the bones lose minerals and strength with aging. This can result in serious bone fractures. Your risk for osteoporosis can be identified using a bone density scan.  If you are 74 years of age or older, or if you are at risk for osteoporosis and fractures, ask your health care provider if you should be screened.  Ask your health care provider whether you should take a calcium or vitamin D supplement to lower your risk for osteoporosis.  Menopause may have certain physical symptoms and risks.  Hormone replacement therapy may reduce some of these symptoms and risks. Talk to your health care provider about whether hormone replacement therapy is right for you. Follow these instructions at home:  Schedule regular health, dental, and eye exams.  Stay current with your immunizations.  Do not use any tobacco products including cigarettes, chewing tobacco, or electronic cigarettes.  If you are pregnant, do not drink alcohol.  If you are breastfeeding, limit how much and how often you drink alcohol.  Limit alcohol intake to no more than 1 drink per day for  nonpregnant women. One drink equals 12 ounces of beer, 5 ounces of wine, or 1 ounces of hard liquor.  Do not use street drugs.  Do not share needles.  Ask your health care provider for help if you need support or information about quitting drugs.  Tell your health care provider if you often feel depressed.  Tell your health care provider if you have ever been abused or do not feel safe at home. This information is not intended to replace advice given to you by your health care provider. Make sure you discuss any questions you have with your health care provider. Document Released: 06/07/2011 Document Revised: 04/29/2016 Document Reviewed: 08/26/2015 Elsevier Interactive Patient Education  2018 [place] look much better today. It will take a few weeks to get back to 100%, but you should see daily improvement.  You can continue to take mucinex DM for cough.  Rest, hydrate.

## 2017-10-21 NOTE — Progress Notes (Signed)
Victoria Bryant , 04-27-1946, 71 y.o., female MRN: [id number] Patient Care Team    Relationship Specialty Notifications Start End  [name], DO PCP - General Family Medicine  06/18/16   [name], MD Consulting Physician Obstetrics and Gynecology  10/06/16   [name], MD Consulting Physician Gastroenterology  10/08/16     Chief Complaint  Patient presents with  . Medicare Wellness  . follow up bronchitis     Subjective:  Depression: Patient reports she thinks she needs to get back on depression medications. She has stopped the Lexapro. Weeks ago on her own secondary to feeling numbness and tingling in both hands and feet. After she stopped Lexapro the symptoms also stopped.   Bronchitis: Patient presents today and states that her cough is still present, but she is feeling improved daily. She still does not feel completely back to her normal self. She denies any fever, chills, nausea or vomiting. Prior note:  Patient presents today with continued symptoms of cough congestion and ear pain. She states she's been very fatigued despite completion of Augmentin today. She has still had mild fevers and chills. She's been taking Tylenol approximately every 4 hours. She reports many people in her family are also still ill. Prior note: Pt presents for an OV with complaints of cough of 3 days duration.  Associated symptoms include chest congestion, cough, wheezing, chest tightness, fever of 101F, chills, headache from coughing and sore throat. She denies nausea, vomit, rash or diarrhea. She endorses a decreased appetite but she is drinking plenty of fluids. She reports everyone in her household has been ill. She has not received her flu shot as of yet this year. Pt has tried over-the-counter cold remedies to ease their symptoms.   Depression screen [facility] 2/9 10/21/2017 07/28/2017 06/30/2017 06/09/2017 10/06/2016  Decreased Interest 1 0 0 3 0  Down, Depressed, Hopeless 1 0 0 2 0  PHQ - 2  Score 2 0 0 5 0  Altered sleeping 0 0 3 0 -  Tired, decreased energy 2 3 3 1  -  Change in appetite 0 0 0 0 -  Feeling bad or failure about yourself  0 0 0 0 -  Trouble concentrating 1 0 0 3 -  Moving slowly or fidgety/restless 0 1 0 0 -  Suicidal thoughts 0 0 0 0 -  PHQ-9 Score 5 4 6 9  -  Difficult doing work/chores Not difficult at all - - - -    Allergies  Allergen Reactions  . Paroxetine     REACTION: unspecified  . Clarithromycin Nausea Only  . Doxycycline Hyclate Nausea And Vomiting  . Erythromycin Ethylsuccinate Nausea Only   Social History   Tobacco Use  . Smoking status: Former Smoker    Last attempt to quit: 12/06/1973    Years since quitting: 43.9  . Smokeless tobacco: Never Used  Substance Use Topics  . Alcohol use: No   Past Medical History:  Diagnosis Date  . COLONIC POLYPS, HX OF 04/21/2007  . DEPRESSION 05/26/2009  . Diverticulitis   . GERD 04/08/2008  . Interstitial cystitis    ? per pt: was told by urologist she has this  . 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 MENOPAUSAL SYNDROME 04/21/2007  . NECK PAIN, CHRONIC 04/21/2007   Past Surgical History:  Procedure Laterality Date  . COLONOSCOPY  x 2   First time polyps, second time no polyps: recall 10 yrs (next is approx 2024 per pt report)  .  INCISION AND DRAINAGE ABSCESS ANAL    . TONSILLECTOMY    . VAGINAL HYSTERECTOMY     Nonmalignant reason.  Ovaries still in.   Family History  Problem Relation Age of Onset  . Bladder Cancer Mother   . Lung cancer Mother   . Breast cancer Paternal Aunt   . ALS Daughter    Allergies as of 10/21/2017      Reactions   Paroxetine    REACTION: unspecified   Clarithromycin Nausea Only   Doxycycline Hyclate Nausea And Vomiting   Erythromycin Ethylsuccinate Nausea Only      Medication List        Accurate as of 10/21/17  9:48 AM. Always use your most recent med list.          ALPRAZolam 0.5 MG tablet Commonly known as:  XANAX Take 1 tablet (0.5 mg  total) by mouth daily.   fluticasone 50 MCG/ACT nasal spray Commonly known as:  FLONASE Place 1 spray into both nostrils daily.   hyoscyamine 0.125 MG SL tablet Commonly known as:  LEVSIN/SL Place 1 tablet (0.125 mg total) under the tongue every 6 (six) hours as needed.   JUICE PLUS FIBRE PO Take by mouth.   meloxicam 15 MG tablet Commonly known as:  MOBIC Take 1 tablet (15 mg total) by mouth daily.   pantoprazole 40 MG tablet Commonly known as:  PROTONIX Take 1 tablet (40 mg total) by mouth daily.   tolterodine 4 MG 24 hr capsule Commonly known as:  DETROL LA Take 4 mg by mouth daily as needed.   TURMERIC PO Take by mouth.   VITAMIN C PO Take by mouth.   ZINC PO Take by mouth.   Zoster Vaccine Adjuvanted injection Commonly known as:  SHINGRIX Inject 0.5 mLs once for 1 dose into the muscle.       All past medical history, surgical history, allergies, family history, immunizations andmedications were updated in the EMR today and reviewed under the history and medication portions of their EMR.     ROS: Negative, with the exception of above mentioned in HPI   Objective:  BP 136/70 (BP Location: Left Arm, Patient Position: Sitting, Cuff Size: Normal)   Pulse 69   Temp 98.4 F (36.9 C) (Oral)   Resp 18   Ht 5\' 3"  (1.6 m)   Wt 154 lb 6.4 oz (70 kg)   SpO2 97%   BMI 27.35 kg/m  Body mass index is 27.35 kg/m.  Gen: Afebrile. No acute distress. Nontoxic appearance, well-developed, well-nourished, Caucasian female. HENT: AT. [place]. MMM. Bilateral nares without erythema, drainage or swelling. Throat without erythema or exudates. Mild cough present. Mild hoarseness present. Eyes:Pupils Equal Round Reactive to light, Extraocular movements intact,  Conjunctiva without redness, discharge or icterus. Neck/lymp/endocrine: Supple, no lymphadenopathy CV: RRR Chest: CTAB, no wheeze or crackles Neuro:  Normal gait. PERLA. EOMi. Alert. Oriented x3  Psych: Sad, overwhelmed,  otherwise Normal affect, dress and demeanor. Normal speech. Normal thought content and judgment.   Hearing Screening Comments: Able to hear conversational tones w/o difficulty. No issues reported.   Vision Screening Comments: Last exam 04/2017, yearly. [facility] in [place]. Wears glasses to 20/20 vision.  No results found. No results found for this or any previous visit (from the past 24 hour(s)).  Assessment/Plan: Victoria Bryant is a 71 y.o. female present for OV for  Bronchitis: Improved. Discussed the can take a few weeks to completely resolve. Continue to rest and hydrate. Infection has been  appropriately treated. Depression: Uncertain if reported reaction with secondary to Lexapro, she had been on that medication for some time. Possibly secondary to increased anxiety. However will start Wellbutrin today with taper. She has been on this medication in the past. Follow-up 4 weeks   Reviewed expectations re: course of current medical issues.  Discussed self-management of symptoms.  Outlined signs and symptoms indicating need for more acute intervention.  Patient verbalized understanding and all questions were answered.  Patient received an After-Visit Summary.    No orders of the defined types were placed in this encounter.    Note is dictated utilizing voice recognition software. Although note has been proof read prior to signing, occasional typographical errors still can be missed. If any questions arise, please do not hesitate to call for verification.   electronically signed by:  Howard Pouch, DO  [place]

## 2017-11-18 ENCOUNTER — Ambulatory Visit: Payer: Medicare Other | Admitting: Family Medicine

## 2017-11-21 ENCOUNTER — Ambulatory Visit (INDEPENDENT_AMBULATORY_CARE_PROVIDER_SITE_OTHER): Payer: Medicare Other | Admitting: Family Medicine

## 2017-11-21 ENCOUNTER — Encounter: Payer: Self-pay | Admitting: Family Medicine

## 2017-11-21 VITALS — BP 129/78 | HR 82 | Temp 98.2°F | Wt 155.0 lb

## 2017-11-21 DIAGNOSIS — G479 Sleep disorder, unspecified: Secondary | ICD-10-CM | POA: Diagnosis not present

## 2017-11-21 DIAGNOSIS — F418 Other specified anxiety disorders: Secondary | ICD-10-CM | POA: Diagnosis not present

## 2017-11-21 MED ORDER — BUPROPION HCL 75 MG PO TABS: 75.0000 mg | ORAL_TABLET | Freq: Two times a day (BID) | ORAL | 0 refills | Status: DC

## 2017-11-21 NOTE — Progress Notes (Signed)
Victoria Bryant , 07/20/1946, 71 y.o., female MRN: [id number] Patient Care Team    Relationship Specialty Notifications Start End  [name], DO PCP - General Family Medicine  06/18/16   [name], MD Consulting Physician Obstetrics and Gynecology  10/06/16   [name], MD Consulting Physician Gastroenterology  10/08/16     Chief Complaint  Patient presents with  . Depression    follow up     Subjective:  Depression: Patient presents today and states that she started taking the Wellbutrin 75 mg twice a day as prescribed 4 weeks ago. However she felt that it was making her have difficulty sleeping since she started taking it just once a day in the morning. She has not noticed any difference in the ability to fall asleep since decreasing medication. She has noticed an increase in her irritability. Her family dynamics are very difficult at the moment with a healed daughter with ALS and an ill husband. Prior note 10/21/2017: Patient reports she thinks she needs to get back on depression medications. She has stopped the Lexapro. Weeks ago on her own secondary to feeling numbness and tingling in both hands and feet. After she stopped Lexapro the symptoms also stopped.    Depression screen [facility] 2/9 11/21/2017 10/21/2017 07/28/2017 06/30/2017 06/09/2017  Decreased Interest 0 1 0 0 3  Down, Depressed, Hopeless 0 1 0 0 2  PHQ - 2 Score 0 2 0 0 5  Altered sleeping 1 0 0 3 0  Tired, decreased energy 0 2 3 3 1   Change in appetite 0 0 0 0 0  Feeling bad or failure about yourself  0 0 0 0 0  Trouble concentrating 0 1 0 0 3  Moving slowly or fidgety/restless 0 0 1 0 0  Suicidal thoughts 0 0 0 0 0  PHQ-9 Score 1 5 4 6 9   Difficult doing work/chores - Not difficult at all - - -    Allergies  Allergen Reactions  . Paroxetine     REACTION: unspecified  . Clarithromycin Nausea Only  . Doxycycline Hyclate Nausea And Vomiting  . Erythromycin Ethylsuccinate Nausea Only  . Lexapro  [Escitalopram Oxalate] Other (See Comments)    Paresthesia    Social History   Tobacco Use  . Smoking status: Former Smoker    Last attempt to quit: 12/06/1973    Years since quitting: 43.9  . Smokeless tobacco: Never Used  Substance Use Topics  . Alcohol use: No   Past Medical History:  Diagnosis Date  . COLONIC POLYPS, HX OF 04/21/2007  . DEPRESSION 05/26/2009  . Diverticulitis   . GERD 04/08/2008  . Interstitial cystitis    ? per pt: was told by urologist she has this  . 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 MENOPAUSAL SYNDROME 04/21/2007  . NECK PAIN, CHRONIC 04/21/2007   Past Surgical History:  Procedure Laterality Date  . COLONOSCOPY  x 2   First time polyps, second time no polyps: recall 10 yrs (next is approx 2024 per pt report)  . INCISION AND DRAINAGE ABSCESS ANAL    . TONSILLECTOMY    . VAGINAL HYSTERECTOMY     Nonmalignant reason.  Ovaries still in.   Family History  Problem Relation Age of Onset  . Bladder Cancer Mother   . Lung cancer Mother   . Breast cancer Paternal Aunt   . ALS Daughter    Allergies as of 11/21/2017      Reactions  Paroxetine    REACTION: unspecified   Clarithromycin Nausea Only   Doxycycline Hyclate Nausea And Vomiting   Erythromycin Ethylsuccinate Nausea Only   Lexapro [escitalopram Oxalate] Other (See Comments)   Paresthesia      Medication List        Accurate as of 11/21/17  9:08 AM. Always use your most recent med list.          ALPRAZolam 0.5 MG tablet Commonly known as:  XANAX Take 1 tablet (0.5 mg total) by mouth daily.   buPROPion 75 MG tablet Commonly known as:  WELLBUTRIN Take 1 tablet (75 mg total) 2 (two) times daily by mouth.   fluticasone 50 MCG/ACT nasal spray Commonly known as:  FLONASE Place 1 spray into both nostrils daily.   hyoscyamine 0.125 MG SL tablet Commonly known as:  LEVSIN/SL Place 1 tablet (0.125 mg total) under the tongue every 6 (six) hours as needed.   JUICE PLUS FIBRE  PO Take by mouth.   meloxicam 15 MG tablet Commonly known as:  MOBIC Take 1 tablet (15 mg total) by mouth daily.   pantoprazole 40 MG tablet Commonly known as:  PROTONIX Take 1 tablet (40 mg total) by mouth daily.   tolterodine 4 MG 24 hr capsule Commonly known as:  DETROL LA Take 4 mg by mouth daily as needed.   TURMERIC PO Take by mouth.   VITAMIN C PO Take by mouth.   ZINC PO Take by mouth.       All past medical history, surgical history, allergies, family history, immunizations andmedications were updated in the EMR today and reviewed under the history and medication portions of their EMR.     ROS: Negative, with the exception of above mentioned in HPI   Objective:  BP 129/78 (BP Location: Right Arm, Patient Position: Sitting, Cuff Size: Normal)   Pulse 82   Temp 98.2 F (36.8 C) (Oral)   Wt 155 lb (70.3 kg)   SpO2 98%   BMI 27.46 kg/m  Body mass index is 27.46 kg/m. Gen: Afebrile. No acute distress. Nontoxic in appearance, well developed, well nourished.  HENT: AT. [place].MMM, no oral lesions.  Eyes:Pupils Equal Round Reactive to light, Extraocular movements intact,  Conjunctiva without redness, discharge or icterus. Neuro: Normal gait. PERLA. EOMi. Alert. Oriented x3  Psych: Normal affect, dress and demeanor. Normal speech. Normal thought content and judgment.  No exam data present No results found. No results found for this or any previous visit (from the past 24 hour(s)).  Assessment/Plan: Victoria Bryant is a 71 y.o. female present for OV for  Depression:  - Lexapro and Wellbutrin both cause insomnia--> likely not the medicine. She has not taken medication routinely as prescribed to see full affect.  - Continue wellbutrin 75 mg BID.  She is agreeable to this today. - Follow-up in 4 weeks if medication is not working as well as she would like, or she would like to try additional therapy.  - If medication is working well, she can call in in 4 weeks  and I will refill a 90 day prescription with one refill, for her to follow-up in 3-6 months depending upon how she feels.  Reviewed expectations re: course of current medical issues.  Discussed self-management of symptoms.  Outlined signs and symptoms indicating need for more acute intervention.  Patient verbalized understanding and all questions were answered.  Patient received an After-Visit Summary.    No orders of the defined types were placed in  this encounter.    Note is dictated utilizing voice recognition software. Although note has been proof read prior to signing, occasional typographical errors still can be missed. If any questions arise, please do not hesitate to call for verification.   electronically signed by:  Howard Pouch, DO  [place]

## 2017-11-21 NOTE — Patient Instructions (Signed)
Call in 4 weeks if doing great and will refill medicine for you.  If not doing great and want to try something else, then will need to see you.   I hope you have a happy holiday.   Please help [place] help you:  We are honored you have chosen [facility] for your Primary Care home. Below you will find basic instructions that you may need to access in the future. Please help [place] help you by reading the instructions, which cover many of the frequent questions we experience.   Prescription refills and request:  -In order to allow more efficient response time, please call your pharmacy for all refills. They will forward the request electronically to [place]. This allows for the quickest possible response. Request left on a nurse line can take longer to refill, since these are checked as time allows between office patients and other phone calls.  - refill request can take up to 3-5 working days to complete.  - If request is sent electronically and request is appropiate, it is usually completed in 1-2 business days.  - all patients will need to be seen routinely for all chronic medical conditions requiring prescription medications (see follow-up below). If you are overdue for follow up on your condition, you will be asked to make an appointment and we will call in enough medication to cover you until your appointment (up to 30 days).  - all controlled substances will require a face to face visit to request/refill.  - if you desire your prescriptions to go through a new pharmacy, and have an active script at original pharmacy, you will need to call your pharmacy and have scripts transferred to new pharmacy. This is completed between the pharmacy locations and not by your provider.    Results: If any images or labs were ordered, it can take up to 1 week to get results depending on the test ordered and the lab/facility running and resulting the test. - Normal or stable results, which do not need further  discussion, may be released to your mychart immediately with attached note to you. A call may not be generated for normal results. Please make certain to sign up for mychart. If you have questions on how to activate your mychart you can call the front office.  - If your results need further discussion, our office will attempt to contact you via phone, and if unable to reach you after 2 attempts, we will release your abnormal result to your mychart with instructions.  - All results will be automatically released in mychart after 1 week.  - Your provider will provide you with explanation and instruction on all relevant material in your results. Please keep in mind, results and labs may appear confusing or abnormal to the untrained eye, but it does not mean they are actually abnormal for you personally. If you have any questions about your results that are not covered, or you desire more detailed explanation than what was provided, you should make an appointment with your provider to do so.   Our office handles many outgoing and incoming calls daily. If we have not contacted you within 1 week about your results, please check your mychart to see if there is a message first and if not, then contact our office.  In helping with this matter, you help decrease call volume, and therefore allow [place] to be able to respond to patients needs more efficiently.   Acute office visits (sick visit):  An acute visit is intended for a new problem and are scheduled in shorter time slots to allow schedule openings for patients with new problems. This is the appropriate visit to discuss a new problem. In order to provide you with excellent quality medical care with proper time for you to explain your problem, have an exam and receive treatment with instructions, these appointments should be limited to one new problem per visit. If you experience a new problem, in which you desire to be addressed, please make an acute office visit,  we save openings on the schedule to accommodate you. Please do not save your new problem for any other type of visit, let us take care of it properly and quickly for you.   Follow up visits:  Depending on your condition(s) your provider will need to see you routinely in order to provide you with quality care and prescribe medication(s). Most chronic conditions (Example: hypertension, Diabetes, depression/anxiety... etc), require visits a couple times a year. Your provider will instruct you on proper follow up for your personal medical conditions and history. Please make certain to make follow up appointments for your condition as instructed. Failing to do so could result in lapse in your medication treatment/refills. If you request a refill, and are overdue to be seen on a condition, we will always provide you with a 30 day script (once) to allow you time to schedule.    Medicare wellness (well visit): - we have a wonderful Nurse Maudie Mercury), that will meet with you and provide you will yearly medicare wellness visits. These visits should occur yearly (can not be scheduled less than 1 calendar year apart) and cover preventive health, immunizations, advance directives and screenings you are entitled to yearly through your medicare benefits. Do not miss out on your entitled benefits, this is when medicare will pay for these benefits to be ordered for you.  These are strongly encouraged by your provider and is the appropriate type of visit to make certain you are up to date with all preventive health benefits. If you have not had your medicare wellness exam in the last 12 months, please make certain to schedule one by calling the office and schedule your medicare wellness with Maudie Mercury as soon as possible.   Yearly physical (well visit):  - Adults are recommended to be seen yearly for physicals. Check with your insurance and date of your last physical, most insurances require one calendar year between physicals.  Physicals include all preventive health topics, screenings, medical exam and labs that are appropriate for gender/age and history. You may have fasting labs needed at this visit. This is a well visit (not a sick visit), new problems should not be covered during this visit (see acute visit).  - Pediatric patients are seen more frequently when they are younger. Your provider will advise you on well child visit timing that is appropriate for your their age. - This is not a medicare wellness visit. Medicare wellness exams do not have an exam portion to the visit. Some medicare companies allow for a physical, some do not allow a yearly physical. If your medicare allows a yearly physical you can schedule the medicare wellness with our nurse Maudie Mercury and have your physical with your provider after, on the same day. Please check with insurance for your full benefits.   Late Policy/No Shows:  - all new patients should arrive 15-30 minutes earlier than appointment to allow [place] time  to  obtain all personal demographics,  insurance information and for you to complete office paperwork. - All established patients should arrive 10-15 minutes earlier than appointment time to update all information and be checked in .  - In our best efforts to run on time, if you are late for your appointment you will be asked to either reschedule or if able, we will work you back into the schedule. There will be a wait time to work you back in the schedule,  depending on availability.  - If you are unable to make it to your appointment as scheduled, please call 24 hours ahead of time to allow [place] to fill the time slot with someone else who needs to be seen. If you do not cancel your appointment ahead of time, you may be charged a no show fee.

## 2017-12-06 LAB — HM DEXA SCAN: HM Dexa Scan: NORMAL

## 2017-12-14 ENCOUNTER — Encounter: Payer: Self-pay | Admitting: Family Medicine

## 2017-12-14 ENCOUNTER — Ambulatory Visit (INDEPENDENT_AMBULATORY_CARE_PROVIDER_SITE_OTHER): Payer: Medicare Other | Admitting: Family Medicine

## 2017-12-14 VITALS — BP 114/67 | HR 84 | Temp 98.3°F | Wt 153.0 lb

## 2017-12-14 DIAGNOSIS — J0100 Acute maxillary sinusitis, unspecified: Secondary | ICD-10-CM

## 2017-12-14 MED ORDER — CEFDINIR 300 MG PO CAPS: 300.0000 mg | ORAL_CAPSULE | Freq: Two times a day (BID) | ORAL | 0 refills | Status: DC

## 2017-12-14 MED ORDER — PANTOPRAZOLE SODIUM 40 MG PO TBEC: 40.0000 mg | DELAYED_RELEASE_TABLET | Freq: Every day | ORAL | 3 refills | Status: DC

## 2017-12-14 NOTE — Patient Instructions (Signed)
Rest, hydrate.  + flonase, mucinex (DM if cough), nettie pot or nasal saline.  omnicef prescribed, take until completed.  Flu shot in 10 days If cough present it can last up to 6-8 weeks.  F/U 2 weeks of not improved.      Sinusitis, Adult Sinusitis is soreness and inflammation of your sinuses. Sinuses are hollow spaces in the bones around your face. They are located:  Around your eyes.  In the middle of your forehead.  Behind your nose.  In your cheekbones.  Your sinuses and nasal passages are lined with a stringy fluid (mucus). Mucus normally drains out of your sinuses. When your nasal tissues get inflamed or swollen, the mucus can get trapped or blocked so air cannot flow through your sinuses. This lets bacteria, viruses, and funguses grow, and that leads to infection. Follow these instructions at home: Medicines  Take, use, or apply over-the-counter and prescription medicines only as told by your doctor. These may include nasal sprays.  If you were prescribed an antibiotic medicine, take it as told by your doctor. Do not stop taking the antibiotic even if you start to feel better. Hydrate and Humidify  Drink enough water to keep your pee (urine) clear or pale yellow.  Use a cool mist humidifier to keep the humidity level in your home above 50%.  Breathe in steam for 10-15 minutes, 3-4 times a day or as told by your doctor. You can do this in the bathroom while a hot shower is running.  Try not to spend time in cool or dry air. Rest  Rest as much as possible.  Sleep with your head raised (elevated).  Make sure to get enough sleep each night. General instructions  Put a warm, moist washcloth on your face 3-4 times a day or as told by your doctor. This will help with discomfort.  Wash your hands often with soap and water. If there is no soap and water, use hand sanitizer.  Do not smoke. Avoid being around people who are smoking (secondhand smoke).  Keep all  follow-up visits as told by your doctor. This is important. Contact a doctor if:  You have a fever.  Your symptoms get worse.  Your symptoms do not get better within 10 days. Get help right away if:  You have a very bad headache.  You cannot stop throwing up (vomiting).  You have pain or swelling around your face or eyes.  You have trouble seeing.  You feel confused.  Your neck is stiff.  You have trouble breathing. This information is not intended to replace advice given to you by your health care provider. Make sure you discuss any questions you have with your health care provider. Document Released: 05/10/2008 Document Revised: 07/18/2016 Document Reviewed: 09/17/2015 Elsevier Interactive Patient Education  [employer].

## 2017-12-14 NOTE — Progress Notes (Signed)
Victoria Bryant , [birth year], 72 y.o., female MRN: [id number] Patient Care Team    Relationship Specialty Notifications Start End  [name], DO PCP - General Family Medicine  06/18/16   [name], MD Consulting Physician Obstetrics and Gynecology  10/06/16   [name], MD Consulting Physician Gastroenterology  10/08/16     Chief Complaint  Patient presents with  . URI    pts complain of sinus greenish drainage, generalized body ache,fever and headache that started saturday, she try tynelol for relief     Subjective:  Victoria Bryant is a 72 y.o. female presents today with complaints of sinus pressure, bloody nose, green nasal drainage, body aches, chills, headache. She denies sore throat, nausea, vomit, diarrhea or rash. Onset the symptoms have been 6 days. She has been using Mucinex to help with her symptoms but not improving. She is starting to feel rundown. She has had recent infection 2 months ago with an extended presentation covered with Augmentin, then azithromycin and prednisone. Chest x-ray completed with with bronchitis-like changes only, no infiltrates. Patient reports after second round of treatment her symptoms did completely resolve. She has not had her flu shot yet this year.  Prior note: Patient presents today with continued symptoms of cough congestion and ear pain. She states she's been very fatigued despite completion of Augmentin today. She has still had mild fevers and chills. She's been taking Tylenol approximately every 4 hours. She reports many people in her family are also still ill. Prior note: Pt presents for an OV with complaints of cough of 3 days duration.  Associated symptoms include chest congestion, cough, wheezing, chest tightness, fever of 101F, chills, headache from coughing and sore throat. She denies nausea, vomit, rash or diarrhea. She endorses a decreased appetite but she is drinking plenty of fluids. She reports everyone in her  household has been ill. She has not received her flu shot as of yet this year. Pt has tried over-the-counter cold remedies to ease their symptoms.   Depression screen [facility] 2/9 11/21/2017 10/21/2017 07/28/2017 06/30/2017 06/09/2017  Decreased Interest 0 1 0 0 3  Down, Depressed, Hopeless 0 1 0 0 2  PHQ - 2 Score 0 2 0 0 5  Altered sleeping 1 0 0 3 0  Tired, decreased energy 0 2 3 3 1   Change in appetite 0 0 0 0 0  Feeling bad or failure about yourself  0 0 0 0 0  Trouble concentrating 0 1 0 0 3  Moving slowly or fidgety/restless 0 0 1 0 0  Suicidal thoughts 0 0 0 0 0  PHQ-9 Score 1 5 4 6 9   Difficult doing work/chores - Not difficult at all - - -    Allergies  Allergen Reactions  . Paroxetine     REACTION: unspecified  . Clarithromycin Nausea Only  . Doxycycline Hyclate Nausea And Vomiting  . Erythromycin Ethylsuccinate Nausea Only  . Lexapro [Escitalopram Oxalate] Other (See Comments)    Paresthesia    Social History   Tobacco Use  . Smoking status: Former Smoker    Last attempt to quit: 12/06/1973    Years since quitting: 44.0  . Smokeless tobacco: Never Used  Substance Use Topics  . Alcohol use: No   Past Medical History:  Diagnosis Date  . COLONIC POLYPS, HX OF 04/21/2007  . DEPRESSION 05/26/2009  . Diverticulitis   . GERD 04/08/2008  . Interstitial cystitis    ? per pt: was told  by urologist she has this  . 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 MENOPAUSAL SYNDROME 04/21/2007  . NECK PAIN, CHRONIC 04/21/2007   Past Surgical History:  Procedure Laterality Date  . COLONOSCOPY  x 2   First time polyps, second time no polyps: recall 10 yrs (next is approx 2024 per pt report)  . INCISION AND DRAINAGE ABSCESS ANAL    . TONSILLECTOMY    . VAGINAL HYSTERECTOMY     Nonmalignant reason.  Ovaries still in.   Family History  Problem Relation Age of Onset  . Bladder Cancer Mother   . Lung cancer Mother   . Breast cancer Paternal Aunt   . ALS Daughter    Allergies as  of 12/14/2017      Reactions   Paroxetine    REACTION: unspecified   Clarithromycin Nausea Only   Doxycycline Hyclate Nausea And Vomiting   Erythromycin Ethylsuccinate Nausea Only   Lexapro [escitalopram Oxalate] Other (See Comments)   Paresthesia      Medication List        Accurate as of 12/14/17  8:53 AM. Always use your most recent med list.          ALPRAZolam 0.5 MG tablet Commonly known as:  XANAX Take 1 tablet (0.5 mg total) by mouth daily.   buPROPion 75 MG tablet Commonly known as:  WELLBUTRIN Take 1 tablet (75 mg total) by mouth 2 (two) times daily.   fluticasone 50 MCG/ACT nasal spray Commonly known as:  FLONASE Place 1 spray into both nostrils daily.   hyoscyamine 0.125 MG SL tablet Commonly known as:  LEVSIN/SL Place 1 tablet (0.125 mg total) under the tongue every 6 (six) hours as needed.   JUICE PLUS FIBRE PO Take by mouth.   meloxicam 15 MG tablet Commonly known as:  MOBIC Take 1 tablet (15 mg total) by mouth daily.   pantoprazole 40 MG tablet Commonly known as:  PROTONIX Take 1 tablet (40 mg total) by mouth daily.   tolterodine 4 MG 24 hr capsule Commonly known as:  DETROL LA Take 4 mg by mouth daily as needed.   TURMERIC PO Take by mouth.   VITAMIN C PO Take by mouth.   ZINC PO Take by mouth.       All past medical history, surgical history, allergies, family history, immunizations andmedications were updated in the EMR today and reviewed under the history and medication portions of their EMR.     ROS: Negative, with the exception of above mentioned in HPI   Objective:  BP 114/67 (BP Location: Right Arm, Patient Position: Sitting, Cuff Size: Normal)   Pulse 84   Temp 98.3 F (36.8 C) (Oral)   Wt 153 lb (69.4 kg)   SpO2 95%   BMI 27.10 kg/m  Body mass index is 27.1 kg/m.  Gen: Afebrile. No acute distress. Nontoxic in appearance, well-developed, well-nourished, pleasant Caucasian female. HENT: AT. [place]. Bilateral TM visualized  and normal in appearance. MMM. Bilateral nares with mild erythema, moderate drainage present. Throat without erythema or exudates. Postnasal drip present, hoarseness present, mild cough. Tender to palpation maxillary sinus. Eyes:Pupils Equal Round Reactive to light, Extraocular movements intact,  Conjunctiva without redness, discharge or icterus. Neck/lymp/endocrine: Supple, no lymphadenopathy CV: RRR Chest: CTAB, no wheeze or crackles Skin: no rashes, purpura or petechiae.  Neuro: Normal gait. PERLA. EOMi. Alert. Oriented x3   No exam data present No results found. No results found for this or any previous visit (from the past  24 hour(s)).  Assessment/Plan: Victoria Bryant is a 72 y.o. female present for OV for  Acute maxillary sinusitis, recurrence not specified Rest, hydrate.  + flonase, mucinex (DM if cough), nettie pot or nasal saline.  omnicef prescribed, take until completed.  If cough present it can last up to 6-8 weeks.  F/U 2 weeks of not improved.    Reviewed expectations re: course of current medical issues.  Discussed self-management of symptoms.  Outlined signs and symptoms indicating need for more acute intervention.  Patient verbalized understanding and all questions were answered.  Patient received an After-Visit Summary.    No orders of the defined types were placed in this encounter.    Note is dictated utilizing voice recognition software. Although note has been proof read prior to signing, occasional typographical errors still can be missed. If any questions arise, please do not hesitate to call for verification.   electronically signed by:  Howard Pouch, DO  [place]

## 2017-12-21 DIAGNOSIS — Z124 Encounter for screening for malignant neoplasm of cervix: Secondary | ICD-10-CM | POA: Diagnosis not present

## 2017-12-21 DIAGNOSIS — Z1231 Encounter for screening mammogram for malignant neoplasm of breast: Secondary | ICD-10-CM | POA: Diagnosis not present

## 2017-12-21 LAB — HM MAMMOGRAPHY

## 2017-12-23 ENCOUNTER — Encounter: Payer: Self-pay | Admitting: *Deleted

## 2017-12-23 ENCOUNTER — Encounter: Payer: Self-pay | Admitting: Family Medicine

## 2017-12-23 DIAGNOSIS — Z85820 Personal history of malignant melanoma of skin: Secondary | ICD-10-CM | POA: Diagnosis not present

## 2017-12-23 DIAGNOSIS — X32XXXD Exposure to sunlight, subsequent encounter: Secondary | ICD-10-CM | POA: Diagnosis not present

## 2017-12-23 DIAGNOSIS — L570 Actinic keratosis: Secondary | ICD-10-CM | POA: Diagnosis not present

## 2017-12-23 DIAGNOSIS — Z1283 Encounter for screening for malignant neoplasm of skin: Secondary | ICD-10-CM | POA: Diagnosis not present

## 2017-12-23 DIAGNOSIS — Z08 Encounter for follow-up examination after completed treatment for malignant neoplasm: Secondary | ICD-10-CM | POA: Diagnosis not present

## 2017-12-26 ENCOUNTER — Ambulatory Visit (INDEPENDENT_AMBULATORY_CARE_PROVIDER_SITE_OTHER): Payer: Medicare Other

## 2017-12-26 DIAGNOSIS — Z23 Encounter for immunization: Secondary | ICD-10-CM | POA: Diagnosis not present

## 2018-02-23 ENCOUNTER — Ambulatory Visit: Payer: Medicare Other | Admitting: Family Medicine

## 2018-02-23 DIAGNOSIS — N3001 Acute cystitis with hematuria: Secondary | ICD-10-CM | POA: Diagnosis not present

## 2018-03-13 ENCOUNTER — Ambulatory Visit: Payer: Medicare Other | Admitting: Family Medicine

## 2018-03-15 ENCOUNTER — Ambulatory Visit (INDEPENDENT_AMBULATORY_CARE_PROVIDER_SITE_OTHER): Payer: Medicare Other | Admitting: Family Medicine

## 2018-03-15 ENCOUNTER — Encounter: Payer: Self-pay | Admitting: Family Medicine

## 2018-03-15 VITALS — BP 139/87 | HR 63 | Temp 97.9°F | Resp 20 | Ht 63.0 in | Wt 155.0 lb

## 2018-03-15 DIAGNOSIS — J0100 Acute maxillary sinusitis, unspecified: Secondary | ICD-10-CM

## 2018-03-15 MED ORDER — CEFDINIR 300 MG PO CAPS: 300.0000 mg | ORAL_CAPSULE | Freq: Two times a day (BID) | ORAL | 0 refills | Status: DC

## 2018-03-15 NOTE — Patient Instructions (Signed)
Rest, hydrate.  +/- flonase, mucinex (DM if cough), nettie pot or nasal saline.  omnicef prescribed, take until completed.  If cough present it can last up to 6-8 weeks.  F/U 2 weeks of not improved.     Sinusitis, Adult Sinusitis is soreness and inflammation of your sinuses. Sinuses are hollow spaces in the bones around your face. They are located:  Around your eyes.  In the middle of your forehead.  Behind your nose.  In your cheekbones.  Your sinuses and nasal passages are lined with a stringy fluid (mucus). Mucus normally drains out of your sinuses. When your nasal tissues get inflamed or swollen, the mucus can get trapped or blocked so air cannot flow through your sinuses. This lets bacteria, viruses, and funguses grow, and that leads to infection. Follow these instructions at home: Medicines  Take, use, or apply over-the-counter and prescription medicines only as told by your doctor. These may include nasal sprays.  If you were prescribed an antibiotic medicine, take it as told by your doctor. Do not stop taking the antibiotic even if you start to feel better. Hydrate and Humidify  Drink enough water to keep your pee (urine) clear or pale yellow.  Use a cool mist humidifier to keep the humidity level in your home above 50%.  Breathe in steam for 10-15 minutes, 3-4 times a day or as told by your doctor. You can do this in the bathroom while a hot shower is running.  Try not to spend time in cool or dry air. Rest  Rest as much as possible.  Sleep with your head raised (elevated).  Make sure to get enough sleep each night. General instructions  Put a warm, moist washcloth on your face 3-4 times a day or as told by your doctor. This will help with discomfort.  Wash your hands often with soap and water. If there is no soap and water, use hand sanitizer.  Do not smoke. Avoid being around people who are smoking (secondhand smoke).  Keep all follow-up visits as told by  your doctor. This is important. Contact a doctor if:  You have a fever.  Your symptoms get worse.  Your symptoms do not get better within 10 days. Get help right away if:  You have a very bad headache.  You cannot stop throwing up (vomiting).  You have pain or swelling around your face or eyes.  You have trouble seeing.  You feel confused.  Your neck is stiff.  You have trouble breathing. This information is not intended to replace advice given to you by your health care provider. Make sure you discuss any questions you have with your health care provider. Document Released: 05/10/2008 Document Revised: 07/18/2016 Document Reviewed: 09/17/2015 Elsevier Interactive Patient Education  [employer].

## 2018-03-15 NOTE — Progress Notes (Signed)
Victoria Bryant , 25-Aug-1946, 72 y.o., female MRN: [id number] Patient Care Team    Relationship Specialty Notifications Start End  Victoria Hillock, DO PCP - General Family Medicine  06/18/16   Victoria Gell, MD Consulting Physician Obstetrics and Gynecology  10/06/16   Victoria Irani, MD (Inactive) Consulting Physician Gastroenterology  10/08/16     Chief Complaint  Patient presents with  . URI    congestion,cough x 2 weeks     Subjective: Pt presents for an OV with complaints of cough  of 2 weeks duration.  Associated symptoms include Chest and sinus congestion, low grade fever to start, productive cough, sinus pressure and headache, pain in ears.  Denies fever currently, chills, SOB. She is run down, however her daughter passed away from [place] 2 weeks ago and she has been very busy dealing with arrangements.  Pt has tried mucinex to ease their symptoms.   Depression screen [facility] 2/9 03/15/2018 11/21/2017 10/21/2017 07/28/2017 06/30/2017  Decreased Interest 0 0 1 0 0  Down, Depressed, Hopeless 0 0 1 0 0  PHQ - 2 Score 0 0 2 0 0  Altered sleeping - 1 0 0 3  Tired, decreased energy - 0 2 3 3   Change in appetite - 0 0 0 0  Feeling bad or failure about yourself  - 0 0 0 0  Trouble concentrating - 0 1 0 0  Moving slowly or fidgety/restless - 0 0 1 0  Suicidal thoughts - 0 0 0 0  PHQ-9 Score - 1 5 4 6   Difficult doing work/chores - - Not difficult at all - -    Allergies  Allergen Reactions  . Paroxetine     REACTION: unspecified  . Clarithromycin Nausea Only  . Doxycycline Hyclate Nausea And Vomiting  . Erythromycin Ethylsuccinate Nausea Only  . Lexapro [Escitalopram Oxalate] Other (See Comments)    Paresthesia    Social History   Tobacco Use  . Smoking status: Former Smoker    Last attempt to quit: 12/06/1973    Years since quitting: 44.3  . Smokeless tobacco: Never Used  Substance Use Topics  . Alcohol use: No   Past Medical History:  Diagnosis Date  . COLONIC POLYPS,  HX OF 04/21/2007  . DEPRESSION 05/26/2009  . Diverticulitis   . GERD 04/08/2008  . Interstitial cystitis    ? per pt: was told by urologist she has this  . 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 MENOPAUSAL SYNDROME 04/21/2007  . NECK PAIN, CHRONIC 04/21/2007   Past Surgical History:  Procedure Laterality Date  . COLONOSCOPY  x 2   First time polyps, second time no polyps: recall 10 yrs (next is approx 2024 per pt report)  . INCISION AND DRAINAGE ABSCESS ANAL    . TONSILLECTOMY    . VAGINAL HYSTERECTOMY     Nonmalignant reason.  Ovaries still in.   Family History  Problem Relation Age of Onset  . Bladder Cancer Mother   . Lung cancer Mother   . Breast cancer Paternal Aunt   . ALS Daughter    Allergies as of 03/15/2018      Reactions   Paroxetine    REACTION: unspecified   Clarithromycin Nausea Only   Doxycycline Hyclate Nausea And Vomiting   Erythromycin Ethylsuccinate Nausea Only   Lexapro [escitalopram Oxalate] Other (See Comments)   Paresthesia      Medication List        Accurate as of 03/15/18  9:23 AM.  Always use your most recent med list.          ALPRAZolam 0.5 MG tablet Commonly known as:  XANAX Take 1 tablet (0.5 mg total) by mouth daily.   fluticasone 50 MCG/ACT nasal spray Commonly known as:  FLONASE Place 1 spray into both nostrils daily.   hyoscyamine 0.125 MG SL tablet Commonly known as:  LEVSIN/SL Place 1 tablet (0.125 mg total) under the tongue every 6 (six) hours as needed.   JUICE PLUS FIBRE PO Take by mouth.   pantoprazole 40 MG tablet Commonly known as:  PROTONIX Take 1 tablet (40 mg total) by mouth daily.   tolterodine 4 MG 24 hr capsule Commonly known as:  DETROL LA Take 4 mg by mouth daily as needed.   TURMERIC PO Take by mouth.   VITAMIN C PO Take by mouth.   ZINC PO Take by mouth.       All past medical history, surgical history, allergies, family history, immunizations andmedications were updated in the EMR today  and reviewed under the history and medication portions of their EMR.     ROS: Negative, with the exception of above mentioned in HPI   Objective:  BP 139/87 (BP Location: Right Arm, Patient Position: Sitting, Cuff Size: Normal)   Pulse 63   Temp 97.9 F (36.6 C)   Resp 20   Ht 5\' 3"  (1.6 m)   Wt 155 lb (70.3 kg)   SpO2 97%   BMI 27.46 kg/m  Body mass index is 27.46 kg/m. Gen: Afebrile. No acute distress. Nontoxic in appearance, well developed, well nourished.  HENT: AT. [place]. Bilateral TM visualized with mild erythema and fullness bilateral. MMM, no oral lesions. Bilateral nares with erythema and drainage. Throat without erythema or exudates. Mild cough, TTP max sinus.  Eyes:Pupils Equal Round Reactive to light, Extraocular movements intact,  Conjunctiva without redness, discharge or icterus. Neck/lymp/endocrine: Supple,no lymphadenopathy CV: RRR  Chest: CTAB, no wheeze or crackles. Good air movement, normal resp effort.  Abd: Soft. NTND. BS present.  Neuro:  Normal gait. PERLA. EOMi. Alert. Oriented x3  No exam data present No results found. No results found for this or any previous visit (from the past 24 hour(s)).  Assessment/Plan: Victoria Bryant is a 72 y.o. female present for OV for  1. Acute non-recurrent maxillary sinusitis Rest, hydrate.  +/- flonase, mucinex (DM if cough), nettie pot or nasal saline.  omnicef prescribed, take until completed.  If cough present it can last up to 6-8 weeks.  F/U 2 weeks of not improved.    Reviewed expectations re: course of current medical issues.  Discussed self-management of symptoms.  Outlined signs and symptoms indicating need for more acute intervention.  Patient verbalized understanding and all questions were answered.  Patient received an After-Visit Summary.    No orders of the defined types were placed in this encounter.    Note is dictated utilizing voice recognition software. Although note has been proof  read prior to signing, occasional typographical errors still can be missed. If any questions arise, please do not hesitate to call for verification.   electronically signed by:  Howard Pouch, DO  [place]

## 2018-04-24 ENCOUNTER — Other Ambulatory Visit: Payer: Self-pay | Admitting: Family Medicine

## 2018-04-24 NOTE — Telephone Encounter (Signed)
Flonase refill Last OV:03/15/18 Last refill:12/31/16 (expired) BWG:[id number] Pharmacy: [place], [place] [phone] (Phone) [phone] (Fax)

## 2018-04-24 NOTE — Telephone Encounter (Signed)
Copied from [place] [id number]. Topic: Quick Communication - Rx Refill/Question >> Apr 24, 2018  8:54 AM Carolyn Stare wrote: Medication  fluticasone (FLONASE) 50 MCG/ACT nasal spray  Preferred Pharmacy  [facility]   Agent: Please be advised that RX refills may take up to 3 business days. We ask that you follow-up with your pharmacy.

## 2018-04-25 MED ORDER — FLUTICASONE PROPIONATE 50 MCG/ACT NA SUSP: 1.0000 | Freq: Every day | NASAL | 3 refills | Status: DC

## 2018-05-04 ENCOUNTER — Ambulatory Visit (INDEPENDENT_AMBULATORY_CARE_PROVIDER_SITE_OTHER): Payer: Medicare Other | Admitting: Family Medicine

## 2018-05-04 ENCOUNTER — Encounter: Payer: Self-pay | Admitting: Family Medicine

## 2018-05-04 VITALS — BP 134/79 | HR 69 | Temp 98.5°F | Resp 20 | Ht 63.0 in | Wt 155.2 lb

## 2018-05-04 DIAGNOSIS — M5386 Other specified dorsopathies, lumbar region: Secondary | ICD-10-CM | POA: Insufficient documentation

## 2018-05-04 DIAGNOSIS — R202 Paresthesia of skin: Secondary | ICD-10-CM | POA: Diagnosis not present

## 2018-05-04 DIAGNOSIS — R200 Anesthesia of skin: Secondary | ICD-10-CM | POA: Diagnosis not present

## 2018-05-04 MED ORDER — METHYLPREDNISOLONE ACETATE 80 MG/ML IJ SUSP
80.0000 mg | Freq: Once | INTRAMUSCULAR | Status: AC
  Administered 2018-05-04: 80 mg via INTRAMUSCULAR

## 2018-05-04 MED ORDER — NAPROXEN 500 MG PO TABS: 500.0000 mg | ORAL_TABLET | Freq: Two times a day (BID) | ORAL | 0 refills | Status: DC

## 2018-05-04 NOTE — Patient Instructions (Addendum)
Start naproxen 1 tab very 12 hours for 5 days, with food.  Steroid shot today.  Complete stretches 2-3x  A day.  F/u 2-4 weeks    Ask your health care provider which exercises are safe for you. Do exercises exactly as told by your health care provider and adjust them as directed. It is normal to feel mild stretching, pulling, tightness, or discomfort as you do these exercises, but you should stop right away if you feel sudden pain or your pain gets worse. Do not begin these exercises until told by your health care provider. Stretching and range of motion exercises These exercises warm up your muscles and joints and improve the movement and flexibility of your hips and your back. These exercises may also help to relieve pain, numbness, and tingling. Exercise A: Single knee to chest  1. Lie on your back on a firm surface with both legs straight. 2. Bend one of your knees. Use your hands to move your knee up toward your chest until you feel a gentle stretch in your lower back and buttock. ? Hold your leg in this position by holding onto the front of your knee. ? Keep your other leg as straight as possible. 3. Hold for __________ seconds. 4. Slowly return to the starting position. 5. Repeat this exercise with your other leg. Repeat __________ times. Complete this exercise __________ times a day. Exercise B: Double knee to chest  1. Lie on your back on a firm surface with both legs straight. 2. Bend one of your knees and move it toward your chest until you feel a gentle stretch in your lower back and buttock. 3. Tense your abdominal muscles and repeat the previous step with your other leg. 4. Hold both of your legs in this position by holding onto the backs of your thighs or the fronts of your knees. 5. Hold for __________ seconds. 6. Tense your abdominal muscles and slowly move your legs back to the floor, one leg at a time. Repeat __________ times. Complete this exercise __________ times a  day. Strengthening exercises These exercises build strength and endurance in your back. Endurance is the ability to use your muscles for a long time, even after they get tired. Exercise C: Pelvic tilt 1. Lie on your back on a firm bed or the floor. Bend your knees and keep your feet flat. 2. Tense your abdominal muscles. Tip your pelvis up toward the ceiling and flatten your lower back into the floor. ? To help with this exercise, you may place a small towel under your lower back and try to push your back into the towel. 3. Hold for __________ seconds. 4. Let your muscles relax completely before you repeat this exercise. Repeat __________ times. Complete this exercise __________ times a day. Exercise D: Abdominal crunch  1. Lie on your back on a firm surface. Bend your knees and keep your feet flat. Cross your arms over your chest. 2. Tuck your chin down toward your chest, without bending your neck. 3. Use your abdominal muscles to lift your upper body off of the ground, straight up into the air. ? Try to lift yourself until your shoulder blades are off the ground. You may need to work up to this. ? Keep your lower back on the ground while you crunch upward. ? Do not hold your breath. 4. Slowly lower yourself down. Keep your abdominal muscles tense until you are back to the starting position. Repeat __________ times. Complete this  exercise __________ times a day. Exercise E: Alternating arm and leg raises  1. Get on your hands and knees on a firm surface. If you are on a hard floor, you may want to use padding to cushion your knees, such as an exercise mat. 2. Line up your arms and legs. Your hands should be below your shoulders, and your knees should be below your hips. 3. Lift your left leg behind you. At the same time, raise your right arm and straighten it in front of you. ? Do not lift your leg higher than your hip. ? Do not lift your arm higher than your shoulder. ? Keep your  abdominal and back muscles tight. ? Keep your hips facing the ground. ? Do not arch your back. ? Keep your balance carefully, and do not hold your breath. 4. Hold for __________ seconds. 5. Slowly return to the starting position and repeat with your right leg and your left arm. Repeat __________ times. Complete this exercise __________ times a day. Posture and body mechanics  Body mechanics refers to the movements and positions of your body while you do your daily activities. Posture is part of body mechanics. Good posture and healthy body mechanics can help to relieve stress in your body's tissues and joints. Good posture means that your spine is in its natural S-curve position (your spine is neutral), your shoulders are pulled back slightly, and your head is not tipped forward. The following are general guidelines for applying improved posture and body mechanics to your everyday activities. Standing   When standing, keep your spine neutral and your feet about hip-width apart. Keep a slight bend in your knees. Your ears, shoulders, and hips should line up.  When you do a task in which you stand in one place for a long time, place one foot up on a stable object that is 2-4 inches (5-10 cm) high, such as a footstool. This helps keep your spine neutral. Sitting   When sitting, keep your spine neutral and keep your feet flat on the floor. Use a footrest, if necessary, and keep your thighs parallel to the floor. Avoid rounding your shoulders, and avoid tilting your head forward.  When working at a desk or a computer, keep your desk at a height where your hands are slightly lower than your elbows. Slide your chair under your desk so you are close enough to maintain good posture.  When working at a computer, place your monitor at a height where you are looking straight ahead and you do not have to tilt your head forward or downward to look at the screen. Resting  When lying down and resting,  avoid positions that are most painful for you.  If you have pain with activities such as sitting, bending, stooping, or squatting (flexion-based activities), lie in a position in which your body does not bend very much. For example, avoid curling up on your side with your arms and knees near your chest (fetal position).  If you have pain with activities such as standing for a long time or reaching with your arms (extension-based activities), lie with your spine in a neutral position and bend your knees slightly. Try the following positions: ? Lying on your side with a pillow between your knees. ? Lying on your back with a pillow under your knees.  Lifting   When lifting objects, keep your feet at least shoulder-width apart and tighten your abdominal muscles.  Bend your knees and hips and  keep your spine neutral. It is important to lift using the strength of your legs, not your back. Do not lock your knees straight out.  Always ask for help to lift heavy or awkward objects. This information is not intended to replace advice given to you by your health care provider. Make sure you discuss any questions you have with your health care provider. Document Released: 11/22/2005 Document Revised: 07/29/2016 Document Reviewed: 09/02/2015 Elsevier Interactive Patient Education  [employer].

## 2018-05-04 NOTE — Progress Notes (Signed)
Victoria Bryant , [birth year], 72 y.o., female MRN: [id number] Patient Care Team    Relationship Specialty Notifications Start End  [name], DO PCP - General Family Medicine  06/18/16   [name], MD Consulting Physician Obstetrics and Gynecology  10/06/16   [name], MD (Inactive) Consulting Physician Gastroenterology  10/08/16     Chief Complaint  Patient presents with  . thigh numbness    right leg x 4 days     Subjective: Pt presents for an OV with complaints of right lateral thigh numbness and tingling of 4 days duration.  Associated symptoms include right sided lower back discomfort. She has had sciatica of her right side in the past, but she has not had numbness. The tingling sensation is like electrical shocks. She denies bladder, bowel changes or weakness of right lower ext/tripping or dragging foot.   Pt has tried heat/ice/mobic to ease their symptoms. She denies prior h/o surgery or injury to lower back/leg. She denies recent increase in activity to strain the area now.   Depression screen [facility] 2/9 03/15/2018 11/21/2017 10/21/2017 07/28/2017 06/30/2017  Decreased Interest 0 0 1 0 0  Down, Depressed, Hopeless 0 0 1 0 0  PHQ - 2 Score 0 0 2 0 0  Altered sleeping - 1 0 0 3  Tired, decreased energy - 0 2 3 3   Change in appetite - 0 0 0 0  Feeling bad or failure about yourself  - 0 0 0 0  Trouble concentrating - 0 1 0 0  Moving slowly or fidgety/restless - 0 0 1 0  Suicidal thoughts - 0 0 0 0  PHQ-9 Score - 1 5 4 6   Difficult doing work/chores - - Not difficult at all - -    Allergies  Allergen Reactions  . Paroxetine     REACTION: unspecified  . Clarithromycin Nausea Only  . Doxycycline Hyclate Nausea And Vomiting  . Erythromycin Ethylsuccinate Nausea Only  . Lexapro [Escitalopram Oxalate] Other (See Comments)    Paresthesia    Social History   Tobacco Use  . Smoking status: Former Smoker    Last attempt to quit: 12/06/1973    Years since  quitting: 44.4  . Smokeless tobacco: Never Used  Substance Use Topics  . Alcohol use: No   Past Medical History:  Diagnosis Date  . COLONIC POLYPS, HX OF 04/21/2007  . DEPRESSION 05/26/2009  . Diverticulitis   . GERD 04/08/2008  . Interstitial cystitis    ? per pt: was told by urologist she has this  . 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 MENOPAUSAL SYNDROME 04/21/2007  . NECK PAIN, CHRONIC 04/21/2007   Past Surgical History:  Procedure Laterality Date  . COLONOSCOPY  x 2   First time polyps, second time no polyps: recall 10 yrs (next is approx 2024 per pt report)  . INCISION AND DRAINAGE ABSCESS ANAL    . TONSILLECTOMY    . VAGINAL HYSTERECTOMY     Nonmalignant reason.  Ovaries still in.   Family History  Problem Relation Age of Onset  . Bladder Cancer Mother   . Lung cancer Mother   . Breast cancer Paternal Aunt   . ALS Daughter    Allergies as of 05/04/2018      Reactions   Paroxetine    REACTION: unspecified   Clarithromycin Nausea Only   Doxycycline Hyclate Nausea And Vomiting   Erythromycin Ethylsuccinate Nausea Only   Lexapro [escitalopram Oxalate] Other (See Comments)  Paresthesia      Medication List        Accurate as of 05/04/18  8:16 AM. Always use your most recent med list.          ALPRAZolam 0.5 MG tablet Commonly known as:  XANAX Take 1 tablet (0.5 mg total) by mouth daily.   fluticasone 50 MCG/ACT nasal spray Commonly known as:  FLONASE Place 1 spray into both nostrils daily.   hyoscyamine 0.125 MG SL tablet Commonly known as:  LEVSIN/SL Place 1 tablet (0.125 mg total) under the tongue every 6 (six) hours as needed.   JUICE PLUS FIBRE PO Take by mouth.   pantoprazole 40 MG tablet Commonly known as:  PROTONIX Take 1 tablet (40 mg total) by mouth daily.   tolterodine 4 MG 24 hr capsule Commonly known as:  DETROL LA Take 4 mg by mouth daily as needed.   TURMERIC PO Take by mouth.   VITAMIN C PO Take by mouth.   ZINC  PO Take by mouth.       All past medical history, surgical history, allergies, family history, immunizations andmedications were updated in the EMR today and reviewed under the history and medication portions of their EMR.     ROS: Negative, with the exception of above mentioned in HPI   Objective:  BP 134/79 (BP Location: Right Arm, Patient Position: Sitting, Cuff Size: Normal)   Pulse 69   Temp 98.5 F (36.9 C)   Resp 20   Ht 5\' 3"  (1.6 m)   Wt 155 lb 4 oz (70.4 kg)   SpO2 98%   BMI 27.50 kg/m  Body mass index is 27.5 kg/m. Gen: Afebrile. No acute distress. Nontoxic in appearance, well developed, well nourished.  HENT: AT. [place].MMM MSK: No erythema of lumbar spine, no tenderness to palpation of lumbar spine.  No tenderness over bilateral SI joint.  No piriformis tenderness bilaterally.  Full range of motion of lumbar spine and right lower extremity.  Intact sensation to temperature right lateral thigh.  Mild numbness to touch.  No tenderness to palpation right lateral thigh or over the prominences of hip.  5/5 bilateral lower extremity muscle strength.  DTR are  equal bilaterally lower extremity. Skin: mo rashes, purpura or petechiae.  Neuro:  Normal gait. PERLA. EOMi. Alert. Oriented x3  No exam data present No results found. No results found for this or any previous visit (from the past 24 hour(s)).  Assessment/Plan: Victoria Bryant is a 72 y.o. female present for OV for  Sciatica of right side associated with disorder of lumbar spine Numbness and tingling of right leg - hold mobic and tumeric. Start naproxen BID for 5 days, then can continue both.  - IM Depo-Medrol shot provided today - Directions on stretches provided today. -Follow-up 2-4 weeks, sooner if worsening.  Monitor for any signs of weakness of the right lower extremity including the foot.  Worsening will consider imaging, referral or physical therapy.  Reviewed expectations re: course of current medical  issues.  Discussed self-management of symptoms.  Outlined signs and symptoms indicating need for more acute intervention.  Patient verbalized understanding and all questions were answered.  Patient received an After-Visit Summary.    No orders of the defined types were placed in this encounter.    Note is dictated utilizing voice recognition software. Although note has been proof read prior to signing, occasional typographical errors still can be missed. If any questions arise, please do not hesitate to call  for verification.   electronically signed by:  Howard Pouch, DO  [place]

## 2018-05-19 ENCOUNTER — Encounter: Payer: Self-pay | Admitting: Family Medicine

## 2018-05-19 ENCOUNTER — Telehealth: Payer: Self-pay | Admitting: Family Medicine

## 2018-05-19 ENCOUNTER — Ambulatory Visit (INDEPENDENT_AMBULATORY_CARE_PROVIDER_SITE_OTHER)
Admission: RE | Admit: 2018-05-19 | Discharge: 2018-05-19 | Disposition: A | Discharge status: Home / Self Care | Payer: Medicare Other | Source: Ambulatory Visit | Attending: Family Medicine | Admitting: Family Medicine

## 2018-05-19 ENCOUNTER — Ambulatory Visit (INDEPENDENT_AMBULATORY_CARE_PROVIDER_SITE_OTHER): Payer: Medicare Other | Admitting: Family Medicine

## 2018-05-19 VITALS — BP 130/67 | HR 65 | Temp 98.0°F | Resp 20 | Ht 63.0 in | Wt 155.0 lb

## 2018-05-19 DIAGNOSIS — R200 Anesthesia of skin: Secondary | ICD-10-CM

## 2018-05-19 DIAGNOSIS — M5386 Other specified dorsopathies, lumbar region: Secondary | ICD-10-CM

## 2018-05-19 DIAGNOSIS — R202 Paresthesia of skin: Secondary | ICD-10-CM | POA: Diagnosis not present

## 2018-05-19 DIAGNOSIS — M47816 Spondylosis without myelopathy or radiculopathy, lumbar region: Secondary | ICD-10-CM | POA: Diagnosis not present

## 2018-05-19 MED ORDER — GABAPENTIN 100 MG PO CAPS: 100.0000 mg | ORAL_CAPSULE | Freq: Three times a day (TID) | ORAL | 0 refills | Status: DC

## 2018-05-19 MED ORDER — PREDNISONE 20 MG PO TABS: ORAL_TABLET | ORAL | 0 refills | Status: DC

## 2018-05-19 NOTE — Telephone Encounter (Signed)
Spoke with patient reviewed xray results ,instructions and information. Patient verbalized understanding. 

## 2018-05-19 NOTE — Patient Instructions (Signed)
Start prednisone taper with food.  Start gabapentin 1 pill at night before bed, if does not make you tired take every 12 hours and if still not tired, can take every 8 hours.  Please have xray completed today at our [facility] office.  [address], [place], [place] [id number]    Do your stretches. If xray looks alright will order physical therapy.

## 2018-05-19 NOTE — Telephone Encounter (Signed)
Please inform patient the following information: Her xray did show evidence of degenerative changes and bone spur, as well as evidence of a curvature consistent with mild scoliosis.  - take the medication as instructed. I have placed an order for PT at [facility]. If she notices PT is causing her more discomfort, then follow up sooner, otherwise F/U 4 weeks.

## 2018-05-19 NOTE — Progress Notes (Signed)
Victoria Bryant , Sep 19, 1946, 72 y.o., female MRN: [id number] Patient Care Team    Relationship Specialty Notifications Start End  [name], DO PCP - General Family Medicine  06/18/16   [name], MD Consulting Physician Obstetrics and Gynecology  10/06/16   [name], MD (Inactive) Consulting Physician Gastroenterology  10/08/16     Chief Complaint  Patient presents with  . Sciatica    right leg     Subjective:  Victoria Bryant 72 y.o. female present for follow up on   Sciatica right leg with lumbar pain:  Pt present for follow today 2 weeks after being seen for her recurrent right sciatica with lower lumbar pain. There is new onset pain and "tingling" of her lateral thigh that she did not experience in times past. See note below. She did feel improvment for a day or two after the IM depo medrol injection, she had declined oral steroids. She was unable to tolerate naproxen. Symptoms are the same with no improvement.   Prior note:  Pt presents for an OV with complaints of right lateral thigh numbness and tingling of 4 days duration.  Associated symptoms include right sided lower back discomfort. She has had sciatica of her right side in the past, but she has not had numbness. The tingling sensation is like electrical shocks. She denies bladder, bowel changes or weakness of right lower ext/tripping or dragging foot.   Pt has tried heat/ice/mobic to ease their symptoms. She denies prior h/o surgery or injury to lower back/leg. She denies recent increase in activity to strain the area now.   Depression screen [facility] 2/9 03/15/2018 11/21/2017 10/21/2017 07/28/2017 06/30/2017  Decreased Interest 0 0 1 0 0  Down, Depressed, Hopeless 0 0 1 0 0  PHQ - 2 Score 0 0 2 0 0  Altered sleeping - 1 0 0 3  Tired, decreased energy - 0 2 3 3   Change in appetite - 0 0 0 0  Feeling bad or failure about yourself  - 0 0 0 0  Trouble concentrating - 0 1 0 0  Moving slowly or  fidgety/restless - 0 0 1 0  Suicidal thoughts - 0 0 0 0  PHQ-9 Score - 1 5 4 6   Difficult doing work/chores - - Not difficult at all - -    Allergies  Allergen Reactions  . Naproxen Diarrhea  . Paroxetine     REACTION: unspecified  . Clarithromycin Nausea Only  . Doxycycline Hyclate Nausea And Vomiting  . Erythromycin Ethylsuccinate Nausea Only  . Lexapro [Escitalopram Oxalate] Other (See Comments)    Paresthesia    Social History   Tobacco Use  . Smoking status: Former Smoker    Last attempt to quit: 12/06/1973    Years since quitting: 44.4  . Smokeless tobacco: Never Used  Substance Use Topics  . Alcohol use: No   Past Medical History:  Diagnosis Date  . COLONIC POLYPS, HX OF 04/21/2007  . DEPRESSION 05/26/2009  . Diverticulitis   . GERD 04/08/2008  . Interstitial cystitis    ? per pt: was told by urologist she has this  . 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 MENOPAUSAL SYNDROME 04/21/2007  . NECK PAIN, CHRONIC 04/21/2007   Past Surgical History:  Procedure Laterality Date  . COLONOSCOPY  x 2   First time polyps, second time no polyps: recall 10 yrs (next is approx 2024 per pt report)  . INCISION AND DRAINAGE ABSCESS ANAL    .  TONSILLECTOMY    . VAGINAL HYSTERECTOMY     Nonmalignant reason.  Ovaries still in.   Family History  Problem Relation Age of Onset  . Bladder Cancer Mother   . Lung cancer Mother   . Breast cancer Paternal Aunt   . ALS Daughter    Allergies as of 05/19/2018      Reactions   Naproxen Diarrhea   Paroxetine    REACTION: unspecified   Clarithromycin Nausea Only   Doxycycline Hyclate Nausea And Vomiting   Erythromycin Ethylsuccinate Nausea Only   Lexapro [escitalopram Oxalate] Other (See Comments)   Paresthesia      Medication List        Accurate as of 05/19/18 10:13 AM. Always use your most recent med list.          ALPRAZolam 0.5 MG tablet Commonly known as:  XANAX Take 1 tablet (0.5 mg total) by mouth daily.     fluticasone 50 MCG/ACT nasal spray Commonly known as:  FLONASE Place 1 spray into both nostrils daily.   hyoscyamine 0.125 MG SL tablet Commonly known as:  LEVSIN/SL Place 1 tablet (0.125 mg total) under the tongue every 6 (six) hours as needed.   JUICE PLUS FIBRE PO Take by mouth.   pantoprazole 40 MG tablet Commonly known as:  PROTONIX Take 1 tablet (40 mg total) by mouth daily.   tolterodine 4 MG 24 hr capsule Commonly known as:  DETROL LA Take 4 mg by mouth daily as needed.   TURMERIC PO Take by mouth.   VITAMIN C PO Take by mouth.   ZINC PO Take by mouth.       All past medical history, surgical history, allergies, family history, immunizations andmedications were updated in the EMR today and reviewed under the history and medication portions of their EMR.     ROS: Negative, with the exception of above mentioned in HPI   Objective:  BP 130/67 (BP Location: Left Arm, Patient Position: Sitting, Cuff Size: Normal)   Pulse 65   Temp 98 F (36.7 C)   Resp 20   Ht 5\' 3"  (1.6 m)   Wt 155 lb (70.3 kg)   SpO2 99%   BMI 27.46 kg/m  Body mass index is 27.46 kg/m.  Gen: Afebrile. No acute distress.  HENT: AT. [place]. MMM.  Eyes:Pupils Equal Round Reactive to light, Extraocular movements intact,  Conjunctiva without redness, discharge or icterus. Neuro: Normal gait. PERLA. EOMi. Alert. Oriented x3  MSK: prior exam: No erythema of lumbar spine, no tenderness to palpation of lumbar spine.  No tenderness over bilateral SI joint.  No piriformis tenderness bilaterally.  Full range of motion of lumbar spine and right lower extremity.  Intact sensation to temperature right lateral thigh.  Mild numbness to touch.  No tenderness to palpation right lateral thigh or over the prominences of hip.  5/5 bilateral lower extremity muscle strength.  DTR are  equal bilaterally lower extremity.  No exam data present No results found. No results found for this or any previous visit (from  the past 24 hour(s)).  Assessment/Plan: Victoria Bryant is a 72 y.o. female present for OV for  Sciatica of right side associated with disorder of lumbar spine Numbness and tingling of right leg - No change. She is willing ot move forward with further work up and PT if appropriate. She has concerns about the "tinlging" bc her daughter had ALS (passed away) - restart mobic. - prednisone taper. could not tolerate naproxen.  -  Gabapentin 100 mg up TID if tolerating. Start QHS.  - Directions on stretches provided today. - lumbar xray today, if normal, will refer to PT at [facility]. Consider neuro if continues to not improve and/or MRI -Follow-up 2 wk, has appt.   Reviewed expectations re: course of current medical issues.  Discussed self-management of symptoms.  Outlined signs and symptoms indicating need for more acute intervention.  Patient verbalized understanding and all questions were answered.  Patient received an After-Visit Summary.    No orders of the defined types were placed in this encounter.    Note is dictated utilizing voice recognition software. Although note has been proof read prior to signing, occasional typographical errors still can be missed. If any questions arise, please do not hesitate to call for verification.   electronically signed by:  Howard Pouch, DO  [place]

## 2018-05-31 ENCOUNTER — Encounter: Payer: Self-pay | Admitting: Family Medicine

## 2018-05-31 ENCOUNTER — Ambulatory Visit (INDEPENDENT_AMBULATORY_CARE_PROVIDER_SITE_OTHER): Payer: Medicare Other | Admitting: Family Medicine

## 2018-05-31 VITALS — BP 130/76 | HR 64 | Temp 98.4°F | Resp 20 | Ht 63.0 in | Wt 155.0 lb

## 2018-05-31 DIAGNOSIS — M5386 Other specified dorsopathies, lumbar region: Secondary | ICD-10-CM | POA: Diagnosis not present

## 2018-05-31 DIAGNOSIS — R202 Paresthesia of skin: Secondary | ICD-10-CM | POA: Diagnosis not present

## 2018-05-31 DIAGNOSIS — R200 Anesthesia of skin: Secondary | ICD-10-CM

## 2018-05-31 DIAGNOSIS — I700 Atherosclerosis of aorta: Secondary | ICD-10-CM

## 2018-05-31 DIAGNOSIS — M4156 Other secondary scoliosis, lumbar region: Secondary | ICD-10-CM

## 2018-05-31 MED ORDER — MELOXICAM 15 MG PO TABS: 15.0000 mg | ORAL_TABLET | Freq: Every day | ORAL | 3 refills | Status: DC

## 2018-05-31 MED ORDER — GABAPENTIN 100 MG PO CAPS: 100.0000 mg | ORAL_CAPSULE | Freq: Three times a day (TID) | ORAL | 1 refills | Status: DC

## 2018-05-31 MED ORDER — BACLOFEN 5 MG PO TABS: 5.0000 mg | ORAL_TABLET | Freq: Two times a day (BID) | ORAL | 0 refills | Status: DC | PRN

## 2018-05-31 NOTE — Progress Notes (Signed)
Victoria Bryant , 1946/08/14, 72 y.o., female MRN: [id number] Patient Care Team    Relationship Specialty Notifications Start End  [name], DO PCP - General Family Medicine  06/18/16   [name], MD Consulting Physician Obstetrics and Gynecology  10/06/16   [name], MD (Inactive) Consulting Physician Gastroenterology  10/08/16     Chief Complaint  Patient presents with  . Follow-up    sciatica     Subjective:  Victoria Bryant 72 y.o. female present for follow up on   Sciatica right leg with lumbar pain:  Since for follow-up again today.  She states that the tingling sensation has improved with the prednisone taper.  She is tolerating gabapentin 100 mg twice daily.  Also tolerating the Mobic 15 mg daily.  She continues to have a muscle weakness/discomfort of her right medial lateral thigh.  X-rays with her today which showed Prominent S-shaped scoliosis lumbar spine. Diffuse multilevel degenerative change, particular at L1-L2. 5 mm retrolisthesis L1 onL2. No acute bony abnormality.  She reports she wants to try OMT/sports med doctor and try to avoid any surgical procedures.  SHe was referred to physical therapy, they have not contacted her yet.  Prior note 6/14/209: Pt present for follow today 2 weeks after being seen for her recurrent right sciatica with lower lumbar pain. There is new onset pain and "tingling" of her lateral thigh that she did not experience in times past. See note below. She did feel improvment for a day or two after the IM depo medrol injection, she had declined oral steroids. She was unable to tolerate naproxen. Symptoms are the same with no improvement.   Prior note:  Pt presents for an OV with complaints of right lateral thigh numbness and tingling of 4 days duration.  Associated symptoms include right sided lower back discomfort. She has had sciatica of her right side in the past, but she has not had numbness. The tingling sensation is like  electrical shocks. She denies bladder, bowel changes or weakness of right lower ext/tripping or dragging foot.   Pt has tried heat/ice/mobic to ease their symptoms. She denies prior h/o surgery or injury to lower back/leg. She denies recent increase in activity to strain the area now.   Depression screen [facility] 2/9 03/15/2018 11/21/2017 10/21/2017 07/28/2017 06/30/2017  Decreased Interest 0 0 1 0 0  Down, Depressed, Hopeless 0 0 1 0 0  PHQ - 2 Score 0 0 2 0 0  Altered sleeping - 1 0 0 3  Tired, decreased energy - 0 2 3 3   Change in appetite - 0 0 0 0  Feeling bad or failure about yourself  - 0 0 0 0  Trouble concentrating - 0 1 0 0  Moving slowly or fidgety/restless - 0 0 1 0  Suicidal thoughts - 0 0 0 0  PHQ-9 Score - 1 5 4 6   Difficult doing work/chores - - Not difficult at all - -    Allergies  Allergen Reactions  . Naproxen Diarrhea  . Paroxetine     REACTION: unspecified  . Clarithromycin Nausea Only  . Doxycycline Hyclate Nausea And Vomiting  . Erythromycin Ethylsuccinate Nausea Only  . Lexapro [Escitalopram Oxalate] Other (See Comments)    Paresthesia    Social History   Tobacco Use  . Smoking status: Former Smoker    Last attempt to quit: 12/06/1973    Years since quitting: 44.5  . Smokeless tobacco: Never Used  Substance Use  Topics  . Alcohol use: No   Past Medical History:  Diagnosis Date  . COLONIC POLYPS, HX OF 04/21/2007  . DEPRESSION 05/26/2009  . Diverticulitis   . GERD 04/08/2008  . Interstitial cystitis    ? per pt: was told by urologist she has this  . 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 MENOPAUSAL SYNDROME 04/21/2007  . NECK PAIN, CHRONIC 04/21/2007   Past Surgical History:  Procedure Laterality Date  . COLONOSCOPY  x 2   First time polyps, second time no polyps: recall 10 yrs (next is approx 2024 per pt report)  . INCISION AND DRAINAGE ABSCESS ANAL    . TONSILLECTOMY    . VAGINAL HYSTERECTOMY     Nonmalignant reason.  Ovaries still in.    Family History  Problem Relation Age of Onset  . Bladder Cancer Mother   . Lung cancer Mother   . Breast cancer Paternal Aunt   . ALS Daughter    Allergies as of 05/31/2018      Reactions   Naproxen Diarrhea   Paroxetine    REACTION: unspecified   Clarithromycin Nausea Only   Doxycycline Hyclate Nausea And Vomiting   Erythromycin Ethylsuccinate Nausea Only   Lexapro [escitalopram Oxalate] Other (See Comments)   Paresthesia      Medication List        Accurate as of 05/31/18  8:09 AM. Always use your most recent med list.          ALPRAZolam 0.5 MG tablet Commonly known as:  XANAX Take 1 tablet (0.5 mg total) by mouth daily.   fluticasone 50 MCG/ACT nasal spray Commonly known as:  FLONASE Place 1 spray into both nostrils daily.   gabapentin 100 MG capsule Commonly known as:  NEURONTIN Take 1 capsule (100 mg total) by mouth 3 (three) times daily.   hyoscyamine 0.125 MG SL tablet Commonly known as:  LEVSIN/SL Place 1 tablet (0.125 mg total) under the tongue every 6 (six) hours as needed.   JUICE PLUS FIBRE PO Take by mouth.   pantoprazole 40 MG tablet Commonly known as:  PROTONIX Take 1 tablet (40 mg total) by mouth daily.   tolterodine 4 MG 24 hr capsule Commonly known as:  DETROL LA Take 4 mg by mouth daily as needed.   TURMERIC PO Take by mouth.   VITAMIN C PO Take by mouth.   ZINC PO Take by mouth.       All past medical history, surgical history, allergies, family history, immunizations andmedications were updated in the EMR today and reviewed under the history and medication portions of their EMR.     ROS: Negative, with the exception of above mentioned in HPI   Objective:  BP 130/76 (BP Location: Right Arm, Patient Position: Sitting, Cuff Size: Normal)   Pulse 64   Temp 98.4 F (36.9 C)   Resp 20   Ht 5\' 3"  (1.6 m)   Wt 155 lb (70.3 kg)   SpO2 97%   BMI 27.46 kg/m  Body mass index is 27.46 kg/m.  Gen: Afebrile. No acute  distress.  Nontoxic and presentation.  Very pleasant Caucasian female. HENT: AT. [place]. MMM.  Eyes:Pupils Equal Round Reactive to light, Extraocular movements intact,  Conjunctiva without redness, discharge or icterus. MSK: Normal gait.  No erythema lumbar spine.  No tenderness to palpation of lumbar spine.  No tenderness over bilateral SI joints.  Full range of motion of lumbar spine and right lower extremity.  Intact sensation to  temperature right lateral thigh.  Mild decreased sensation to touch.  5/5 bilateral lower extremity muscle strength, very mild decrease in hip flexion strength (4+/5).  Vascular intact distally.  No exam data present No results found. No results found for this or any previous visit (from the past 24 hour(s)).  Assessment/Plan: Victoria Bryant is a 72 y.o. female present for OV for  Sciatica of right side associated with disorder of lumbar spine Numbness and tingling of right leg - mild improvement with tingling sensation, numbness and discomfort/weak feeling remain.  - She has concerns about the "tinlging" bc her daughter passed away recently from  [place] result: Prominent S-shaped scoliosis lumbar spine. Diffuse multilevel degenerative change, particular at L1-L2. 5 mm retrolisthesis L1 on L2. No acute bony abnormality.--> reviewed with her today. - Continue mobic, refilled today.  - prednisone taper completed, possibly small improvement.  - Gabapentin 100 mg up TID , tolerating.Refilled.  - Continue stretches.  - added baclofen.  - PT referred, but did not call her yet. She is agreeable to SM/OMT referral. Placed today. She is wanting to avoid surgery all together if possible. She is aware [facility] may refer to Neuro if they feel appropriate.  -  PRN if est w/ [facility] for this condition  Atherosclerosis of aorta ([place]) - discussed incidental find on xray. Exercise, risk modifications, cholesterol control etc discussed    Reviewed expectations re: course of current  medical issues.  Discussed self-management of symptoms.  Outlined signs and symptoms indicating need for more acute intervention.  Patient verbalized understanding and all questions were answered.  Patient received an After-Visit Summary.    No orders of the defined types were placed in this encounter.    Note is dictated utilizing voice recognition software. Although note has been proof read prior to signing, occasional typographical errors still can be missed. If any questions arise, please do not hesitate to call for verification.   electronically signed by:  Howard Pouch, DO  [place]

## 2018-05-31 NOTE — Patient Instructions (Signed)
I have referred you  To Dr. Tamala Julian for OMT (osteopathic manipulation treatment). His is a doctor of osteopathic medicine (like me), he is a sports medicine specialist.   He is at the [place] building.   Until that time, continue stretches, gabapentin, mobic and added baclofen two times a day.   Degenerative Disk Disease Degenerative disk disease is a condition caused by the changes that occur in spinal disks as you grow older. Spinal disks are soft and compressible disks located between the bones of your spine (vertebrae). These disks act like shock absorbers. Degenerative disk disease can affect the whole spine. However, the neck and lower back are most commonly affected. Many changes can occur in the spinal disks with aging, such as:  The spinal disks may dry and shrink.  Small tears may occur in the tough, outer covering of the disk (annulus).  The disk space may become smaller due to loss of water.  Abnormal growths in the bone (spurs) may occur. This can put pressure on the nerve roots exiting the spinal canal, causing pain.  The spinal canal may become narrowed.  What increases the risk?  Being overweight.  Having a family history of degenerative disk disease.  Smoking.  There is increased risk if you are doing heavy lifting or have a sudden injury. What are the signs or symptoms? Symptoms vary from person to person and may include:  Pain that varies in intensity. Some people have no pain, while others have severe pain. The location of the pain depends on the part of your backbone that is affected. ? You will have neck or arm pain if a disk in the neck area is affected. ? You will have pain in your back, buttocks, or legs if a disk in the lower back is affected.  Pain that becomes worse while bending, reaching up, or with twisting movements.  Pain that may start gradually and then get worse as time passes. It may also start after a major or minor injury.  Numbness or tingling  in the arms or legs.  How is this diagnosed? Your health care provider will ask you about your symptoms and about activities or habits that may cause the pain. He or she may also ask about any injuries, diseases, or treatments you have had. Your health care provider will examine you to check for the range of movement that is possible in the affected area, to check for strength in your extremities, and to check for sensation in the areas of the arms and legs supplied by different nerve roots. You may also have:  An X-ray of the spine.  Other imaging tests, such as MRI.  How is this treated? Your health care provider will advise you on the best plan for treatment. Treatment may include:  Medicines.  Rehabilitation exercises.  Follow these instructions at home:  Follow proper lifting and walking techniques as advised by your health care provider.  Maintain good posture.  Exercise regularly as advised by your health care provider.  Perform relaxation exercises.  Change your sitting, standing, and sleeping habits as advised by your health care provider.  Change positions frequently.  Lose weight or maintain a healthy weight as advised by your health care provider.  Do not use any tobacco products, including cigarettes, chewing tobacco, or electronic cigarettes. If you need help quitting, ask your health care provider.  Wear supportive footwear.  Take medicines only as directed by your health care provider. Contact a health care provider  if:  Your pain does not go away within 1-4 weeks.  You have significant appetite or weight loss. Get help right away if:  Your pain is severe.  You notice weakness in your arms, hands, or legs.  You begin to lose control of your bladder or bowel movements.  You have fevers or night sweats. This information is not intended to replace advice given to you by your health care provider. Make sure you discuss any questions you have with your  health care provider. Document Released: 09/19/2007 Document Revised: 04/29/2016 Document Reviewed: 03/26/2014 Elsevier Interactive Patient Education  [employer].

## 2018-06-22 ENCOUNTER — Encounter: Payer: Self-pay | Admitting: Family Medicine

## 2018-06-22 ENCOUNTER — Ambulatory Visit (INDEPENDENT_AMBULATORY_CARE_PROVIDER_SITE_OTHER): Payer: Medicare Other | Admitting: Family Medicine

## 2018-06-22 VITALS — BP 131/80 | HR 74 | Temp 98.4°F | Resp 20 | Ht 63.0 in | Wt 155.0 lb

## 2018-06-22 DIAGNOSIS — R350 Frequency of micturition: Secondary | ICD-10-CM | POA: Diagnosis not present

## 2018-06-22 DIAGNOSIS — J0100 Acute maxillary sinusitis, unspecified: Secondary | ICD-10-CM | POA: Diagnosis not present

## 2018-06-22 DIAGNOSIS — R309 Painful micturition, unspecified: Secondary | ICD-10-CM

## 2018-06-22 LAB — POC URINALSYSI DIPSTICK (AUTOMATED)
Bilirubin, UA: NEGATIVE
Blood, UA: NEGATIVE
GLUCOSE UA: NEGATIVE
KETONES UA: NEGATIVE
Leukocytes, UA: NEGATIVE
Nitrite, UA: NEGATIVE
Protein, UA: NEGATIVE
SPEC GRAV UA: 1.01 (ref 1.010–1.025)
Urobilinogen, UA: 0.2 E.U./dL
pH, UA: 6.5 (ref 5.0–8.0)

## 2018-06-22 MED ORDER — CEFDINIR 300 MG PO CAPS: 300.0000 mg | ORAL_CAPSULE | Freq: Two times a day (BID) | ORAL | 0 refills | Status: DC

## 2018-06-22 NOTE — Patient Instructions (Signed)
Rest, hydrate.  + flonase, mucinex, nettie pot or nasal saline.  Omnicef prescribed, take until completed.  If cough present it can last up to 6-8 weeks.  F/U 2 weeks of not improved.    We will call you with urine culture--> if negative and no improvement will refer to urology .    Interstitial Cystitis Interstitial cystitis is a condition that causes inflammation of the bladder. The bladder is a hollow organ in the lower part of your abdomen. It stores urine after the urine is made by your kidneys. With interstitial cystitis, you may have pain in the bladder area. You may also have a frequent and urgent need to urinate. The severity of interstitial cystitis can vary from person to person. You may have flare-ups of the condition, and then it may go away for a while. For many people who have this condition, it becomes a long-term problem. What are the causes? The cause of this condition is not known. What increases the risk? This condition is more likely to develop in women. What are the signs or symptoms? Symptoms of interstitial cystitis vary, and they can change over time. Symptoms may include:  Discomfort or pain in the bladder area. This can range from mild to severe. The pain may change in intensity as the bladder fills with urine or as it empties.  Pelvic pain.  An urgent need to urinate.  Frequent urination.  Pain during sexual intercourse.  Pinpoint bleeding on the bladder wall.  For women, the symptoms often get worse during menstruation. How is this diagnosed? This condition is diagnosed by evaluating your symptoms and ruling out other causes. A physical exam will be done. Various tests may be done to rule out other conditions. Common tests include:  Urine tests.  Cystoscopy. In this test, a tool that is like a very thin telescope is used to look into your bladder.  Biopsy. This involves taking a sample of tissue from the bladder wall to be examined under a  microscope.  How is this treated? There is no cure for interstitial cystitis, but treatment methods are available to control your symptoms. Work closely with your health care provider to find the treatments that will be most effective for you. Treatment options may include:  Medicines to relieve pain and to help reduce the number of times that you feel the need to urinate.  Bladder training. This involves learning ways to control when you urinate, such as: ? Urinating at scheduled times. ? Training yourself to delay urination. ? Doing exercises (Kegel exercises) to strengthen the muscles that control urine flow.  Lifestyle changes, such as changing your diet or taking steps to control stress.  Use of a device that provides electrical stimulation in order to reduce pain.  A procedure that stretches your bladder by filling it with air or fluid.  Surgery. This is rare. It is only done for extreme cases if other treatments do not help.  Follow these instructions at home:  Take medicines only as directed by your health care provider.  Use bladder training techniques as directed. ? Keep a bladder diary to find out which foods, liquids, or activities make your symptoms worse. ? Use your bladder diary to schedule bathroom trips. If you are away from home, plan to be near a bathroom at each of your scheduled times. ? Make sure you urinate just before you leave the house and just before you go to bed.  Do Kegel exercises as directed by  your health care provider.  Do not drink alcohol.  Do not use any tobacco products, including cigarettes, chewing tobacco, or electronic cigarettes. If you need help quitting, ask your health care provider.  Make dietary changes as directed by your health care provider. You may need to avoid spicy foods and foods that contain a high amount of potassium.  Limit your drinking of beverages that stimulate urination. These include soda, coffee, and tea.  Keep  all follow-up visits as directed by your health care provider. This is important. Contact a health care provider if:  Your symptoms do not get better after treatment.  Your pain and discomfort are getting worse.  You have more frequent urges to urinate.  You have a fever. Get help right away if:  You are not able to control your bladder at all. This information is not intended to replace advice given to you by your health care provider. Make sure you discuss any questions you have with your health care provider. Document Released: 07/23/2004 Document Revised: 04/29/2016 Document Reviewed: 07/30/2014 Elsevier Interactive Patient Education  2018 [employer].  Sinusitis, Adult Sinusitis is soreness and inflammation of your sinuses. Sinuses are hollow spaces in the bones around your face. They are located:  Around your eyes.  In the middle of your forehead.  Behind your nose.  In your cheekbones.  Your sinuses and nasal passages are lined with a stringy fluid (mucus). Mucus normally drains out of your sinuses. When your nasal tissues get inflamed or swollen, the mucus can get trapped or blocked so air cannot flow through your sinuses. This lets bacteria, viruses, and funguses grow, and that leads to infection. Follow these instructions at home: Medicines  Take, use, or apply over-the-counter and prescription medicines only as told by your doctor. These may include nasal sprays.  If you were prescribed an antibiotic medicine, take it as told by your doctor. Do not stop taking the antibiotic even if you start to feel better. Hydrate and Humidify  Drink enough water to keep your pee (urine) clear or pale yellow.  Use a cool mist humidifier to keep the humidity level in your home above 50%.  Breathe in steam for 10-15 minutes, 3-4 times a day or as told by your doctor. You can do this in the bathroom while a hot shower is running.  Try not to spend time in cool or dry  air. Rest  Rest as much as possible.  Sleep with your head raised (elevated).  Make sure to get enough sleep each night. General instructions  Put a warm, moist washcloth on your face 3-4 times a day or as told by your doctor. This will help with discomfort.  Wash your hands often with soap and water. If there is no soap and water, use hand sanitizer.  Do not smoke. Avoid being around people who are smoking (secondhand smoke).  Keep all follow-up visits as told by your doctor. This is important. Contact a doctor if:  You have a fever.  Your symptoms get worse.  Your symptoms do not get better within 10 days. Get help right away if:  You have a very bad headache.  You cannot stop throwing up (vomiting).  You have pain or swelling around your face or eyes.  You have trouble seeing.  You feel confused.  Your neck is stiff.  You have trouble breathing. This information is not intended to replace advice given to you by your health care provider. Make sure you  discuss any questions you have with your health care provider. Document Released: 05/10/2008 Document Revised: 07/18/2016 Document Reviewed: 09/17/2015 Elsevier Interactive Patient Education  [employer].

## 2018-06-22 NOTE — Progress Notes (Signed)
Victoria Bryant , [birth year], 72 y.o., female MRN: [id number] Patient Care Team    Relationship Specialty Notifications Start End  [name], DO PCP - General Family Medicine  06/18/16   [name], MD Consulting Physician Obstetrics and Gynecology  10/06/16   [name], MD (Inactive) Consulting Physician Gastroenterology  10/08/16     Chief Complaint  Patient presents with  . URI    congestionx 1 week  . Urinary Frequency    pain with urination x 3 days     Subjective: Pt presents for an OV with complaints nasal congestion of 1 week  and dysuria 3 days duration.  Associated symptoms include bladder pain with urination, urinary frequency and feels like incomplete bladder emptying. She denies nausea or lower back pain.   URI: She reports 1 week ago started with nasal congestion R>L and it has progressed over the last few days to include headache, ears popping, severe pressure of right max sinus and mild facial swelling. She reports feeling fatigued. She denies fever, chills, vomit,  Sore throat or cough. She has been using mucinex and nettie pot.   Depression screen [facility] 2/9 03/15/2018 11/21/2017 10/21/2017 07/28/2017 06/30/2017  Decreased Interest 0 0 1 0 0  Down, Depressed, Hopeless 0 0 1 0 0  PHQ - 2 Score 0 0 2 0 0  Altered sleeping - 1 0 0 3  Tired, decreased energy - 0 2 3 3   Change in appetite - 0 0 0 0  Feeling bad or failure about yourself  - 0 0 0 0  Trouble concentrating - 0 1 0 0  Moving slowly or fidgety/restless - 0 0 1 0  Suicidal thoughts - 0 0 0 0  PHQ-9 Score - 1 5 4 6   Difficult doing work/chores - - Not difficult at all - -    Allergies  Allergen Reactions  . Naproxen Diarrhea  . Paroxetine     REACTION: unspecified  . Clarithromycin Nausea Only  . Doxycycline Hyclate Nausea And Vomiting  . Erythromycin Ethylsuccinate Nausea Only  . Lexapro [Escitalopram Oxalate] Other (See Comments)    Paresthesia    Social History   Tobacco Use  .  Smoking status: Former Smoker    Last attempt to quit: 12/06/1973    Years since quitting: 44.5  . Smokeless tobacco: Never Used  Substance Use Topics  . Alcohol use: No   Past Medical History:  Diagnosis Date  . COLONIC POLYPS, HX OF 04/21/2007  . DEPRESSION 05/26/2009  . Diverticulitis   . GERD 04/08/2008  . Interstitial cystitis    ? per pt: was told by urologist she has this  . 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 MENOPAUSAL SYNDROME 04/21/2007  . NECK PAIN, CHRONIC 04/21/2007   Past Surgical History:  Procedure Laterality Date  . COLONOSCOPY  x 2   First time polyps, second time no polyps: recall 10 yrs (next is approx 2024 per pt report)  . INCISION AND DRAINAGE ABSCESS ANAL    . TONSILLECTOMY    . VAGINAL HYSTERECTOMY     Nonmalignant reason.  Ovaries still in.   Family History  Problem Relation Age of Onset  . Bladder Cancer Mother   . Lung cancer Mother   . Breast cancer Paternal Aunt   . ALS Daughter    Allergies as of 06/22/2018      Reactions   Naproxen Diarrhea   Paroxetine    REACTION: unspecified   Clarithromycin Nausea  Only   Doxycycline Hyclate Nausea And Vomiting   Erythromycin Ethylsuccinate Nausea Only   Lexapro [escitalopram Oxalate] Other (See Comments)   Paresthesia      Medication List        Accurate as of 06/22/18  9:27 AM. Always use your most recent med list.          ALPRAZolam 0.5 MG tablet Commonly known as:  XANAX Take 1 tablet (0.5 mg total) by mouth daily.   Baclofen 5 MG Tabs Take 5 mg by mouth 2 (two) times daily as needed.   fluticasone 50 MCG/ACT nasal spray Commonly known as:  FLONASE Place 1 spray into both nostrils daily.   gabapentin 100 MG capsule Commonly known as:  NEURONTIN Take 1 capsule (100 mg total) by mouth 3 (three) times daily.   hyoscyamine 0.125 MG SL tablet Commonly known as:  LEVSIN/SL Place 1 tablet (0.125 mg total) under the tongue every 6 (six) hours as needed.   JUICE PLUS FIBRE  PO Take by mouth.   meloxicam 15 MG tablet Commonly known as:  MOBIC Take 1 tablet (15 mg total) by mouth daily.   pantoprazole 40 MG tablet Commonly known as:  PROTONIX Take 1 tablet (40 mg total) by mouth daily.   tolterodine 4 MG 24 hr capsule Commonly known as:  DETROL LA Take 4 mg by mouth daily as needed.   TURMERIC PO Take by mouth.   VITAMIN C PO Take by mouth.   ZINC PO Take by mouth.       All past medical history, surgical history, allergies, family history, immunizations andmedications were updated in the EMR today and reviewed under the history and medication portions of their EMR.     ROS: Negative, with the exception of above mentioned in HPI   Objective:  BP 131/80 (BP Location: Right Arm, Patient Position: Sitting, Cuff Size: Normal)   Pulse 74   Temp 98.4 F (36.9 C)   Resp 20   Ht 5\' 3"  (1.6 m)   Wt 155 lb (70.3 kg)   SpO2 96%   BMI 27.46 kg/m  Body mass index is 27.46 kg/m. Gen: Afebrile. No acute distress. Nontoxic in appearance, well developed, well nourished.  HENT: AT. [place]. Bilateral TM visualized with fluid present, no erythema or bulging. MMM, no oral lesions. Bilateral nares with erythema, drainage and swelling. Throat without erythema or exudates. Right max sinus pressure and swelling. No cough, no hoarseness.  Eyes:Pupils Equal Round Reactive to light, Extraocular movements intact,  Conjunctiva without redness, discharge or icterus. Neck/lymp/endocrine: Supple,right cervical anterior lymphadenopathy CV: RRR  Chest: CTAB, no wheeze or crackles. Good air movement, normal resp effort.  Abd: Soft. NTND. BS present. no Masses palpated. No rebound or guarding.  MSK: no cva tenderness bilaterally.  Neuro:  Normal gait. PERLA. EOMi. Alert. Oriented x3   No exam data present No results found. Results for orders placed or performed in visit on 06/22/18 (from the past 24 hour(s))  POCT Urinalysis Dipstick (Automated)     Status: None    Collection Time: 06/22/18  9:08 AM  Result Value Ref Range   Color, UA yellow    Clarity, UA cloudy    Glucose, UA Negative Negative   Bilirubin, UA negative    Ketones, UA negative    Spec Grav, UA 1.010 1.010 - 1.025   Blood, UA negative    pH, UA 6.5 5.0 - 8.0   Protein, UA Negative Negative   Urobilinogen, UA 0.2  0.2 or 1.0 E.U./dL   Nitrite, UA negative    Leukocytes, UA Negative Negative    Assessment/Plan: Victoria Bryant is a 72 y.o. female present for OV for  Urinary frequency/Pain emptying bladder - POCT is normal--> sent for culture to be complete given symptoms. Poss. IC, seemed to improve some with detrol (old script) - POCT Urinalysis Dipstick (Automated) - Urine Culture - F/U depending on result and clinic outcome. Consider uro referral for further eval   Acute non-recurrent maxillary sinusitis Rest, hydrate.  + flonase, mucinex, nettie pot or nasal saline.  Omnicef prescribed, take until completed.  If cough present it can last up to 6-8 weeks.  F/U 2 weeks of not improved    Reviewed expectations re: course of current medical issues.  Discussed self-management of symptoms.  Outlined signs and symptoms indicating need for more acute intervention.  Patient verbalized understanding and all questions were answered.  Patient received an After-Visit Summary.    Orders Placed This Encounter  Procedures  . POCT Urinalysis Dipstick (Automated)     Note is dictated utilizing voice recognition software. Although note has been proof read prior to signing, occasional typographical errors still can be missed. If any questions arise, please do not hesitate to call for verification.   electronically signed by:  Howard Pouch, DO  [place]

## 2018-06-23 ENCOUNTER — Emergency Department (HOSPITAL_BASED_OUTPATIENT_CLINIC_OR_DEPARTMENT_OTHER): Payer: Medicare Other

## 2018-06-23 ENCOUNTER — Other Ambulatory Visit: Payer: Self-pay

## 2018-06-23 ENCOUNTER — Emergency Department (HOSPITAL_BASED_OUTPATIENT_CLINIC_OR_DEPARTMENT_OTHER)
Admission: EM | Admit: 2018-06-23 | Discharge: 2018-06-23 | Disposition: A | Discharge status: Home / Self Care | Payer: Medicare Other | Attending: Emergency Medicine | Admitting: Emergency Medicine

## 2018-06-23 ENCOUNTER — Encounter (HOSPITAL_BASED_OUTPATIENT_CLINIC_OR_DEPARTMENT_OTHER): Payer: Self-pay

## 2018-06-23 DIAGNOSIS — R140 Abdominal distension (gaseous): Secondary | ICD-10-CM | POA: Diagnosis not present

## 2018-06-23 DIAGNOSIS — R1030 Lower abdominal pain, unspecified: Secondary | ICD-10-CM | POA: Diagnosis not present

## 2018-06-23 DIAGNOSIS — Z87891 Personal history of nicotine dependence: Secondary | ICD-10-CM | POA: Diagnosis not present

## 2018-06-23 DIAGNOSIS — K5732 Diverticulitis of large intestine without perforation or abscess without bleeding: Secondary | ICD-10-CM | POA: Insufficient documentation

## 2018-06-23 DIAGNOSIS — Z79899 Other long term (current) drug therapy: Secondary | ICD-10-CM | POA: Insufficient documentation

## 2018-06-23 DIAGNOSIS — K5730 Diverticulosis of large intestine without perforation or abscess without bleeding: Secondary | ICD-10-CM | POA: Diagnosis not present

## 2018-06-23 LAB — URINALYSIS, ROUTINE W REFLEX MICROSCOPIC
Bilirubin Urine: NEGATIVE
GLUCOSE, UA: NEGATIVE mg/dL
HGB URINE DIPSTICK: NEGATIVE
KETONES UR: NEGATIVE mg/dL
LEUKOCYTES UA: NEGATIVE
Nitrite: NEGATIVE
PROTEIN: NEGATIVE mg/dL
Specific Gravity, Urine: <1.005 — ABNORMAL LOW (ref 1.005–1.030)
pH: 6.5 (ref 5.0–8.0)

## 2018-06-23 LAB — COMPREHENSIVE METABOLIC PANEL
ALBUMIN: 4 g/dL (ref 3.5–5.0)
ALK PHOS: 93 U/L (ref 38–126)
ALT: 25 U/L (ref 0–44)
AST: 25 U/L (ref 15–41)
Anion gap: 9 (ref 5–15)
BUN: 9 mg/dL (ref 8–23)
CHLORIDE: 103 mmol/L (ref 98–111)
CO2: 28 mmol/L (ref 22–32)
CREATININE: 0.69 mg/dL (ref 0.44–1.00)
Calcium: 9.3 mg/dL (ref 8.9–10.3)
GFR calc Af Amer: >60 mL/min (ref >60)
GFR calc non Af Amer: >60 mL/min (ref >60)
Glucose, Bld: 107 mg/dL — ABNORMAL HIGH (ref 70–99)
Potassium: 3.6 mmol/L (ref 3.5–5.1)
SODIUM: 140 mmol/L (ref 135–145)
Total Bilirubin: 0.5 mg/dL (ref 0.3–1.2)
Total Protein: 7.4 g/dL (ref 6.5–8.1)

## 2018-06-23 LAB — CBC
HCT: 40.3 % (ref 36.0–46.0)
Hemoglobin: 13.4 g/dL (ref 12.0–15.0)
MCH: 31.1 pg (ref 26.0–34.0)
MCHC: 33.3 g/dL (ref 30.0–36.0)
MCV: 93.5 fL (ref 78.0–100.0)
PLATELETS: 311 10*3/uL (ref 150–400)
RBC: 4.31 MIL/uL (ref 3.87–5.11)
RDW: 13.1 % (ref 11.5–15.5)
WBC: 11.3 10*3/uL — AB (ref 4.0–10.5)

## 2018-06-23 LAB — LIPASE, BLOOD: Lipase: 43 U/L (ref 11–51)

## 2018-06-23 MED ORDER — IOPAMIDOL (ISOVUE-300) INJECTION 61%
100.0000 mL | Freq: Once | INTRAVENOUS | Status: AC | PRN
  Administered 2018-06-23: 100 mL via INTRAVENOUS

## 2018-06-23 MED ORDER — CIPROFLOXACIN HCL 500 MG PO TABS: 500.0000 mg | ORAL_TABLET | Freq: Two times a day (BID) | ORAL | 0 refills | Status: DC

## 2018-06-23 MED ORDER — METRONIDAZOLE 500 MG PO TABS: 500.0000 mg | ORAL_TABLET | Freq: Two times a day (BID) | ORAL | 0 refills | Status: DC

## 2018-06-23 MED ORDER — IOPAMIDOL (ISOVUE-300) INJECTION 61%: 30.0000 mL | Freq: Once | INTRAVENOUS | Status: DC | PRN

## 2018-06-23 MED FILL — metroNIDAZOLE 500 MG TABS: 500 | 10 days supply | Qty: 20 | Fill #0

## 2018-06-23 MED FILL — CIPROFLOXACIN HCL 500 MG TA: 500 | 10 days supply | Qty: 20 | Fill #0

## 2018-06-23 NOTE — ED Provider Notes (Signed)
[place] EMERGENCY DEPARTMENT Provider Note   CSN: [id number] Arrival date & time: 06/23/18  1103     History   Chief Complaint Chief Complaint  Patient presents with  . Abdominal Pain    HPI Victoria Bryant is a 72 y.o. female.  Female who presents the emergency department chief complaint of abdominal pain, distention and bloating.  Patient states that she is a history of previous episodes of diverticulitis.  Patient states she developed a sensation of lower abdominal fullness and distention yesterday which is consistent with her previous 2 episodes of diverticulitis.  Today she developed pain.  She states the only thing that is different about today and she does not have pain in her rectum.  She denies fevers, chills, diarrhea, melena, hematochezia, nausea or vomiting.  HPI  Past Medical History:  Diagnosis Date  . COLONIC POLYPS, HX OF 04/21/2007  . DEPRESSION 05/26/2009  . Diverticulitis   . GERD 04/08/2008  . Interstitial cystitis    ? per pt: was told by urologist she has this  . 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 MENOPAUSAL SYNDROME 04/21/2007  . NECK PAIN, CHRONIC 04/21/2007    Patient Active Problem List   Diagnosis Date Noted  . Pain emptying bladder 06/22/2018  . Scoliosis of lumbar region due to degenerative disease of spine in adult 05/31/2018  . Atherosclerosis of aorta ([place]) 05/31/2018  . Sciatica of right side associated with disorder of lumbar spine 05/04/2018  . Numbness and tingling of right leg 05/04/2018  . Sleep disturbance 06/30/2017  . Elevated TSH 06/09/2017  . Diverticulosis of intestine without bleeding 06/09/2017  . Prediabetes 04/14/2017  . Depression with anxiety 05/26/2009  . GERD 04/08/2008  . History of colonic polyps 04/21/2007    Past Surgical History:  Procedure Laterality Date  . COLONOSCOPY  x 2   First time polyps, second time no polyps: recall 10 yrs (next is approx 2024 per pt report)  . INCISION AND  DRAINAGE ABSCESS ANAL    . TONSILLECTOMY    . VAGINAL HYSTERECTOMY     Nonmalignant reason.  Ovaries still in.     OB History   None      Home Medications    Prior to Admission medications   Medication Sig Start Date End Date Taking? Authorizing Provider  ALPRAZolam Duanne Moron) 0.5 MG tablet Take 1 tablet (0.5 mg total) by mouth daily. 10/06/16   Kuneff, Renee A, DO  Ascorbic Acid (VITAMIN C PO) Take by mouth.    [provider]  Baclofen 5 MG TABS Take 5 mg by mouth 2 (two) times daily as needed. 05/31/18   Kuneff, Renee A, DO  cefdinir (OMNICEF) 300 MG capsule Take 1 capsule (300 mg total) by mouth 2 (two) times daily. 06/22/18   Kuneff, Renee A, DO  fluticasone (FLONASE) 50 MCG/ACT nasal spray Place 1 spray into both nostrils daily. 04/25/18   Kuneff, Renee A, DO  gabapentin (NEURONTIN) 100 MG capsule Take 1 capsule (100 mg total) by mouth 3 (three) times daily. 05/31/18   Kuneff, Renee A, DO  hyoscyamine (LEVSIN/SL) 0.125 MG SL tablet Place 1 tablet (0.125 mg total) under the tongue every 6 (six) hours as needed. 10/06/15   McGowen, Adrian Blackwater, MD  meloxicam (MOBIC) 15 MG tablet Take 1 tablet (15 mg total) by mouth daily. 05/31/18   Kuneff, Renee A, DO  Multiple Vitamins-Minerals (ZINC PO) Take by mouth.    [provider]  Nutritional Supplements (JUICE PLUS  FIBRE PO) Take by mouth.    [provider]  pantoprazole (PROTONIX) 40 MG tablet Take 1 tablet (40 mg total) by mouth daily. 12/14/17   Kuneff, Renee A, DO  tolterodine (DETROL LA) 4 MG 24 hr capsule Take 4 mg by mouth daily as needed. 09/26/13   Marletta Lor, MD  TURMERIC PO Take by mouth.    [provider]    Family History Family History  Problem Relation Age of Onset  . Bladder Cancer Mother   . Lung cancer Mother   . Breast cancer Paternal Aunt   . ALS Daughter     Social History Social History   Tobacco Use  . Smoking status: Former Smoker    Last attempt to quit: 12/06/1973     Years since quitting: 44.5  . Smokeless tobacco: Never Used  Substance Use Topics  . Alcohol use: No  . Drug use: No     Allergies   Naproxen; Paroxetine; Clarithromycin; Doxycycline hyclate; Erythromycin ethylsuccinate; and Lexapro [escitalopram oxalate]   Review of Systems Review of Systems   Physical Exam Updated Vital Signs BP (!) 153/69 (BP Location: Left Arm)   Pulse 64   Temp 98.1 F (36.7 C) (Oral)   Resp 16   Ht 5\' 3"  (1.6 m)   Wt 70.3 kg (155 lb)   SpO2 98%   BMI 27.46 kg/m   Physical Exam  Constitutional: She is oriented to person, place, and time. She appears well-developed and well-nourished. No distress.  HENT:  Head: Normocephalic and atraumatic.  Eyes: Conjunctivae are normal. No scleral icterus.  Neck: Normal range of motion.  Cardiovascular: Normal rate, regular rhythm and normal heart sounds. Exam reveals no gallop and no friction rub.  No murmur heard. Pulmonary/Chest: Effort normal and breath sounds normal. No respiratory distress.  Abdominal: Soft. Bowel sounds are normal. She exhibits distension. She exhibits no mass. There is no tenderness. There is no guarding.  Lower abdominal distention, minimal tenderness to palpation  Neurological: She is alert and oriented to person, place, and time.  Skin: Skin is warm and dry. She is not diaphoretic.  Psychiatric: Her behavior is normal.  Nursing note and vitals reviewed.    ED Treatments / Results  Labs (all labs ordered are listed, but only abnormal results are displayed) Labs Reviewed  COMPREHENSIVE METABOLIC PANEL - Abnormal; Notable for the following components:      Result Value   Glucose, Bld 107 (*)    All other components within normal limits  CBC - Abnormal; Notable for the following components:   WBC 11.3 (*)    All other components within normal limits  URINALYSIS, ROUTINE W REFLEX MICROSCOPIC - Abnormal; Notable for the following components:   Specific Gravity, Urine <1.005 (*)      All other components within normal limits  LIPASE, BLOOD    EKG None  Radiology Ct Abdomen Pelvis W Contrast  Result Date: 06/23/2018 CLINICAL DATA:  Abdominal pain and distention for several days. Personal history of diverticulitis. EXAM: CT ABDOMEN AND PELVIS WITH CONTRAST TECHNIQUE: Multidetector CT imaging of the abdomen and pelvis was performed using the standard protocol following bolus administration of intravenous contrast. CONTRAST:  13mL ISOVUE-300 IOPAMIDOL (ISOVUE-300) INJECTION 61% COMPARISON:  07/19/2016 FINDINGS: Lower Chest: No acute findings. Hepatobiliary:  No hepatic masses identified. Pancreas:  No mass or inflammatory changes. Spleen: Within normal limits in size and appearance. Adrenals/Urinary Tract: No masses identified. No evidence of hydronephrosis. Stomach/Bowel: Sigmoid diverticulosis is seen. Mild  wall thickening and pericolonic inflammatory changes seen involving the proximal sigmoid colon, consistent with mild diverticulitis. No evidence of abscess or extraluminal gas. Normal appendix visualized. Vascular/Lymphatic: No pathologically enlarged lymph nodes. No abdominal aortic aneurysm. Aortic atherosclerosis. Reproductive: Prior hysterectomy noted. Adnexal regions are unremarkable in appearance. Other:  None. Musculoskeletal: No suspicious bone lesions identified. Increased severe degenerative disc disease with discogenic sclerosis at L1-2. IMPRESSION: Mild sigmoid diverticulitis. No evidence of abscess or other complication. Increased L1-2 degenerative disc disease with discogenic sclerosis. Electronically Signed   By: Earle Gell M.D.   On: 06/23/2018 15:18    Procedures Procedures (including critical care time)  Medications Ordered in ED Medications  iopamidol (ISOVUE-300) 61 % injection 30 mL (has no administration in time range)  iopamidol (ISOVUE-300) 61 % injection 100 mL (100 mLs Intravenous Contrast Given 06/23/18 1447)     Initial Impression /  Assessment and Plan / ED Course  I have reviewed the triage vital signs and the nursing notes.  Pertinent labs & imaging results that were available during my care of the patient were reviewed by me and considered in my medical decision making (see chart for details).     Patient with diverticulitis on her CT scan.  She is not having significant pain, afebrile labs are reassuring.  Patient will be discharged with Cipro, Flagyl and bowel rest.  I discussed return precautions.  She appears appropriate for discharge at this time  Final Clinical Impressions(s) / ED Diagnoses   Final diagnoses:  None    ED Discharge Orders    None       Margarita Mail, PA-C 06/23/18 [place], MD 06/24/18 [phone]

## 2018-06-23 NOTE — Discharge Instructions (Signed)
You have diverticulitis.  You will be treated with 2 antibiotics for the next 10 days to take twice daily.  He will need to be on bowel rest for the next several days.  Maintain a clear liquid diet until your pain and bloating have improved then you may slowly advance her diet.  Follow-up with your primary care physician.  Return to the emergency department for the reasons listed below: Your pain gets worse. Your symptoms do not get better with treatment. Your symptoms suddenly get worse. You have a fever. You vomit more than one time. You have stools that are bloody, black, or tarry.

## 2018-06-23 NOTE — ED Notes (Signed)
Pt verbalizes understanding of d/c instructions and denies any further needs at this time. 

## 2018-06-23 NOTE — ED Notes (Signed)
ED Provider at bedside. 

## 2018-06-23 NOTE — ED Triage Notes (Signed)
Pt c/o abd pain that "feels like diverticulitis" started this am-NAD-steady gait

## 2018-06-24 LAB — URINE CULTURE
MICRO NUMBER:: 90854123
Result:: NO GROWTH
SPECIMEN QUALITY:: ADEQUATE

## 2018-06-26 NOTE — Progress Notes (Signed)
[facility] [address], [place] [address] Phone: [phone] Subjective:    I'm seeing this patient by the request  of:  Kuneff, Renee A, DO   CC: Right hip pain  INO:[id number]  Victoria Bryant is a 72 y.o. female coming in with complaint of right hip pain. She has been having numbness in the right thigh for 2 months. She also complains of pain in the right glute. Has tried stretching which can help. Pain in the glute is intermittent. The tingling in the thigh is constant. Pain is worse with prolonged sitting. Has tried physical therapy and is suppose to be getting started with them again.   Patient did have x-rays taken on May 19, 2018.  Independently visualized by me.  Diffuse multilevel degenerative changes of the lumbar spine with a scoliosis noted.  Most marked arthrodesis at L1-L2 with a 5 mm retrolisthesis of L1 on L2     Past Medical History:  Diagnosis Date  . COLONIC POLYPS, HX OF 04/21/2007  . DEPRESSION 05/26/2009  . Diverticulitis   . GERD 04/08/2008  . Interstitial cystitis    ? per pt: was told by urologist she has this  . 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 MENOPAUSAL SYNDROME 04/21/2007  . NECK PAIN, CHRONIC 04/21/2007   Past Surgical History:  Procedure Laterality Date  . COLONOSCOPY  x 2   First time polyps, second time no polyps: recall 10 yrs (next is approx 2024 per pt report)  . INCISION AND DRAINAGE ABSCESS ANAL    . TONSILLECTOMY    . VAGINAL HYSTERECTOMY     Nonmalignant reason.  Ovaries still in.   Social History   Socioeconomic History  . Marital status: Married    Spouse name: Not on file  . Number of children: Not on file  . Years of education: Not on file  . Highest education level: Not on file  Occupational History  . Not on file  Social Needs  . Financial resource strain: Not on file  . Food insecurity:    Worry: Not on file    Inability: Not on file  . Transportation needs:   Medical: Not on file    Non-medical: Not on file  Tobacco Use  . Smoking status: Former Smoker    Last attempt to quit: 12/06/1973    Years since quitting: 44.5  . Smokeless tobacco: Never Used  Substance and Sexual Activity  . Alcohol use: No  . Drug use: No  . Sexual activity: Not on file  Lifestyle  . Physical activity:    Days per week: Not on file    Minutes per session: Not on file  . Stress: Not on file  Relationships  . Social connections:    Talks on phone: Not on file    Gets together: Not on file    Attends religious service: Not on file    Active member of club or organization: Not on file    Attends meetings of clubs or organizations: Not on file    Relationship status: Not on file  Other Topics Concern  . Not on file  Social History Narrative   Married, one daughter lives next door to her.   Retired Art gallery manager.   Tob: 20 pack yr hx, quit age 3.   No alc.   Allergies  Allergen Reactions  . Naproxen Diarrhea  . Paroxetine     REACTION: unspecified  . Clarithromycin Nausea  Only  . Doxycycline Hyclate Nausea And Vomiting  . Erythromycin Ethylsuccinate Nausea Only  . Lexapro [Escitalopram Oxalate] Other (See Comments)    Paresthesia    Family History  Problem Relation Age of Onset  . Bladder Cancer Mother   . Lung cancer Mother   . Breast cancer Paternal Aunt   . ALS Daughter      Past medical history, social, surgical and family history all reviewed in electronic medical record.  No pertanent information unless stated regarding to the chief complaint.   Review of Systems:Review of systems updated and as accurate as of 06/27/18  No headache, visual changes, nausea, vomiting, diarrhea, constipation, dizziness, abdominal pain, skin rash, fevers, chills, night sweats, weight loss, swollen lymph nodes, body aches, joint swelling, , chest pain, shortness of breath, mood changes.  Positive muscle aches  Objective  Blood pressure 140/80, pulse  67, height 5\' 3"  (1.6 m), weight 155 lb (70.3 kg), SpO2 98 %. Systems examined below as of 06/27/18   General: No apparent distress alert and oriented x3 mood and affect normal, dressed appropriately.  HEENT: Pupils equal, extraocular movements intact  Respiratory: Patient's speak in full sentences and does not appear short of breath  Cardiovascular: No lower extremity edema, non tender, no erythema  Skin: Warm dry intact with no signs of infection or rash on extremities or on axial skeleton.  Abdomen: Soft nontender  Neuro: Cranial nerves II through XII are intact, neurovascularly intact in all extremities with 2+ DTRs and 2+ pulses.  Lymph: No lymphadenopathy of posterior or anterior cervical chain or axillae bilaterally.  Gait normal with good balance and coordination.  MSK:  Non tender with full range of motion and good stability and symmetric strength and tone of shoulders, elbows, wrist, hip, knee and ankles bilaterally.  Mild arthritic changes of multiple joints  Back Exam:  Inspection: Loss of lordosis with some degenerative scoliosis Motion: Flexion 35 deg, Extension 20 deg, Side Bending to 35 deg bilaterally,  Rotation to 30 deg bilaterally  SLR laying: Negative  XSLR laying: Negative  Palpable tenderness: Mild tenderness to palpation of the lumbosacral area right greater than left. FABER: Positive right. Sensory change: Gross sensation intact to all lumbar and sacral dermatomes.  Reflexes: 2+ at both patellar tendons, 2+ at achilles tendons, Babinski's downgoing.  Strength at foot  Plantar-flexion: 5/5 Dorsi-flexion: 5/5 Eversion: 5/5 Inversion: 5/5  Leg strength  Quad: 5/5 Hamstring: 5/5 Hip flexor: 5/5 Hip abductors: But symmetric Gait unremarkable.  [id number]; 15 additional minutes spent for Therapeutic exercises as stated in above notes.  This included exercises focusing on stretching, strengthening, with significant focus on eccentric aspects.   Long term goals include an  improvement in range of motion, strength, endurance as well as avoiding reinjury. Patient's frequency would include in 1-2 times a day, 3-5 times a week for a duration of 6-12 weeks. Low back exercises that included:  Pelvic tilt/bracing instruction to focus on control of the pelvic girdle and lower abdominal muscles  Glute strengthening exercises, focusing on proper firing of the glutes without engaging the low back muscles Proper stretching techniques for maximum relief for the hamstrings, hip flexors, low back and some rotation where tolerated   Proper technique shown and discussed handout in great detail with ATC.  All questions were discussed and answered.     Impression and Recommendations:     This case required medical decision making of moderate complexity.      Note: This dictation was prepared with  Dragon dictation along with smaller Company secretary. Any transcriptional errors that result from this process are unintentional.

## 2018-06-27 ENCOUNTER — Encounter: Payer: Self-pay | Admitting: Family Medicine

## 2018-06-27 ENCOUNTER — Ambulatory Visit (INDEPENDENT_AMBULATORY_CARE_PROVIDER_SITE_OTHER): Payer: Medicare Other | Admitting: Family Medicine

## 2018-06-27 DIAGNOSIS — M4156 Other secondary scoliosis, lumbar region: Secondary | ICD-10-CM | POA: Diagnosis not present

## 2018-06-27 MED ORDER — VITAMIN D (ERGOCALCIFEROL) 1.25 MG (50000 UNIT) PO CAPS: 50000.0000 [IU] | ORAL_CAPSULE | ORAL | 0 refills | Status: DC

## 2018-06-27 NOTE — Assessment & Plan Note (Signed)
Scoliosis with degenerative disc disease.  I do believe that there is a nerve impingement likely contributing to some of the discomfort and pain. I agree with the gabapentin that when patient was put on by primary care provider.  We discussed icing regimen, home exercises given.  We discussed that advanced imaging could be warranted with possible epidurals if not making improvement.  We discussed the possibility of formal physical therapy which patient will be starting in the future.  Follow-up again in 4 to 6 weeks.

## 2018-06-27 NOTE — Patient Instructions (Addendum)
Good to see you  Ice 20 minutes 2 times daily. Usually after activity and before bed. Once weekly vitamin D for 12 weeks Gabapentin 200mg  at night only  Over the counter get  Turmeric 500mg  daily  Tart cherry extract any dose at night Exercises 3 times a week.  See PT when you feel good.  See me again in 4-6 weeks

## 2018-07-07 ENCOUNTER — Telehealth: Payer: Self-pay | Admitting: Gastroenterology

## 2018-07-21 ENCOUNTER — Encounter: Payer: Self-pay | Admitting: Gastroenterology

## 2018-07-31 ENCOUNTER — Telehealth: Payer: Self-pay | Admitting: Family Medicine

## 2018-07-31 NOTE — Telephone Encounter (Signed)
Copied from [place] [id number]. Topic: Referral - Request >> Jul 31, 2018  1:43 PM Gardiner Ramus wrote: Reason for CRM: beth from [facility] called and stated that she got rid of the referral from patient because the patient had not responded to making an appointment. Beth would like you to fax over referral again. Please advise [phone]

## 2018-07-31 NOTE — Telephone Encounter (Signed)
Faxed referral to [facility] PT

## 2018-08-03 DIAGNOSIS — M545 Low back pain: Secondary | ICD-10-CM | POA: Diagnosis not present

## 2018-08-08 ENCOUNTER — Ambulatory Visit: Payer: Medicare Other | Admitting: Family Medicine

## 2018-08-08 DIAGNOSIS — N819 Female genital prolapse, unspecified: Secondary | ICD-10-CM | POA: Diagnosis not present

## 2018-08-08 DIAGNOSIS — N3941 Urge incontinence: Secondary | ICD-10-CM | POA: Diagnosis not present

## 2018-08-09 DIAGNOSIS — M545 Low back pain: Secondary | ICD-10-CM | POA: Diagnosis not present

## 2018-08-10 DIAGNOSIS — M545 Low back pain: Secondary | ICD-10-CM | POA: Diagnosis not present

## 2018-08-15 DIAGNOSIS — M545 Low back pain: Secondary | ICD-10-CM | POA: Diagnosis not present

## 2018-08-17 DIAGNOSIS — M545 Low back pain: Secondary | ICD-10-CM | POA: Diagnosis not present

## 2018-08-21 DIAGNOSIS — M545 Low back pain: Secondary | ICD-10-CM | POA: Diagnosis not present

## 2018-08-24 DIAGNOSIS — M545 Low back pain: Secondary | ICD-10-CM | POA: Diagnosis not present

## 2018-08-28 DIAGNOSIS — M545 Low back pain: Secondary | ICD-10-CM | POA: Diagnosis not present

## 2018-08-31 DIAGNOSIS — M545 Low back pain: Secondary | ICD-10-CM | POA: Diagnosis not present

## 2018-09-04 ENCOUNTER — Ambulatory Visit: Payer: Medicare Other | Admitting: Family Medicine

## 2018-09-05 DIAGNOSIS — M545 Low back pain: Secondary | ICD-10-CM | POA: Diagnosis not present

## 2018-09-06 ENCOUNTER — Ambulatory Visit (AMBULATORY_SURGERY_CENTER): Payer: Self-pay | Admitting: *Deleted

## 2018-09-06 VITALS — Ht 63.0 in | Wt 158.0 lb

## 2018-09-06 DIAGNOSIS — Z86010 Personal history of colonic polyps: Secondary | ICD-10-CM

## 2018-09-06 MED ORDER — NA SULFATE-K SULFATE-MG SULF 17.5-3.13-1.6 GM/177ML PO SOLN: 1.0000 | Freq: Once | ORAL | 0 refills | Status: AC

## 2018-09-06 NOTE — Progress Notes (Signed)
No egg or soy allergy known to patient  No issues with past sedation with any surgeries  or procedures, no intubation problems  No diet pills per patient No home 02 use per patient  No blood thinners per patient  Pt denies issues with constipation  No A fib or A flutter  EMMI video sent to pt's e mail pt declined   

## 2018-09-07 DIAGNOSIS — M545 Low back pain: Secondary | ICD-10-CM | POA: Diagnosis not present

## 2018-09-11 NOTE — Progress Notes (Signed)
[facility] [place], [place] [address] Phone: [phone] Subjective:   [name], am serving as a scribe for Dr. Hulan Saas.  CC: Back pain bilateral hand pain  QIW:[id number]  Victoria Bryant is a 72 y.o. female coming in with complaint of back pain. She is doing some physical therapy. Therapy is helping with her strength. Continues to have numbness in right leg. No change.  Patient does feel that some daily activities has gotten a little bit better though.  Patient would like to talk about numbness in bilateral hands. Has been going on for 1 month. Worse with driving. Does have history of this sensation. Has not sought medical attention for numbness though.  Patient states that it seems to be intermittent and when she starts increasing activity it seems to get better again.  Seems on that every morning and last for at least 1 hour.     Past Medical History:  Diagnosis Date  . Arthritis   . COLONIC POLYPS, HX OF 04/21/2007  . DEPRESSION 05/26/2009  . Diverticulitis   . GERD 04/08/2008  . Interstitial cystitis    ? per pt: was told by urologist she has this  . 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 MENOPAUSAL SYNDROME 04/21/2007  . NECK PAIN, CHRONIC 04/21/2007   Past Surgical History:  Procedure Laterality Date  . COLONOSCOPY  x 2   First time polyps, second time no polyps: recall 10 yrs (next is approx 2024 per pt report)  . INCISION AND DRAINAGE ABSCESS ANAL    . POLYPECTOMY    . TONSILLECTOMY    . UPPER GASTROINTESTINAL ENDOSCOPY    . VAGINAL HYSTERECTOMY     Nonmalignant reason.  Ovaries still in.   Social History   Socioeconomic History  . Marital status: Married    Spouse name: Not on file  . Number of children: Not on file  . Years of education: Not on file  . Highest education level: Not on file  Occupational History  . Not on file  Social Needs  . Financial resource strain: Not on file  . Food  insecurity:    Worry: Not on file    Inability: Not on file  . Transportation needs:    Medical: Not on file    Non-medical: Not on file  Tobacco Use  . Smoking status: Former Smoker    Last attempt to quit: 12/06/1973    Years since quitting: 44.7  . Smokeless tobacco: Never Used  Substance and Sexual Activity  . Alcohol use: No  . Drug use: No  . Sexual activity: Not on file  Lifestyle  . Physical activity:    Days per week: Not on file    Minutes per session: Not on file  . Stress: Not on file  Relationships  . Social connections:    Talks on phone: Not on file    Gets together: Not on file    Attends religious service: Not on file    Active member of club or organization: Not on file    Attends meetings of clubs or organizations: Not on file    Relationship status: Not on file  Other Topics Concern  . Not on file  Social History Narrative   Married, one daughter lives next door to her.   Retired Art gallery manager.   Tob: 20 pack yr hx, quit age 53.   No alc.   Allergies  Allergen Reactions  .  Naproxen Diarrhea  . Paroxetine     REACTION: unspecified  . Clarithromycin Nausea Only  . Doxycycline Hyclate Nausea And Vomiting  . Erythromycin Ethylsuccinate Nausea Only  . Lexapro [Escitalopram Oxalate] Other (See Comments)    Paresthesia    Family History  Problem Relation Age of Onset  . Bladder Cancer Mother   . Lung cancer Mother   . Breast cancer Paternal Aunt   . ALS Daughter   . Colon cancer Neg Hx   . Colon polyps Neg Hx   . Esophageal cancer Neg Hx   . Rectal cancer Neg Hx   . Stomach cancer Neg Hx       Current Outpatient Medications (Respiratory):  .  fluticasone (FLONASE) 50 MCG/ACT nasal spray, Place 1 spray into both nostrils daily.  Current Outpatient Medications (Analgesics):  .  meloxicam (MOBIC) 15 MG tablet, Take 1 tablet (15 mg total) by mouth daily.  Current Outpatient Medications (Hematological):  Marland Kitchen  Cyanocobalamin (VITAMIN  B 12 PO), Take by mouth.  Current Outpatient Medications (Other):  Marland Kitchen  ALPRAZolam (XANAX) 0.5 MG tablet, Take 1 tablet (0.5 mg total) by mouth daily. .  Ascorbic Acid (VITAMIN C PO), Take by mouth. .  Ascorbic Acid (VITAMIN C) 1000 MG tablet, Take 1,000 mg by mouth daily. .  Baclofen 5 MG TABS, Take 5 mg by mouth 2 (two) times daily as needed. .  Biotin w/ Vitamins C & E (HAIR/SKIN/NAILS PO), Take by mouth. .  gabapentin (NEURONTIN) 100 MG capsule, Take 1 capsule (100 mg total) by mouth 3 (three) times daily. .  hyoscyamine (LEVSIN/SL) 0.125 MG SL tablet, Place 1 tablet (0.125 mg total) under the tongue every 6 (six) hours as needed. .  Misc Natural Products (TART CHERRY ADVANCED PO), Take by mouth. .  Multiple Vitamins-Minerals (ZINC PO), Take by mouth. .  Nutritional Supplements (JUICE PLUS FIBRE PO), Take by mouth. .  pantoprazole (PROTONIX) 40 MG tablet, Take 1 tablet (40 mg total) by mouth daily. .  Probiotic Product (RA PROBIOTIC GUMMIES PO), Take by mouth. .  tolterodine (DETROL LA) 4 MG 24 hr capsule, Take 4 mg by mouth daily as needed. .  TURMERIC PO, Take by mouth. .  Vitamin D, Ergocalciferol, (DRISDOL) 50000 units CAPS capsule, Take 1 capsule (50,000 Units total) by mouth every 7 (seven) days.    Past medical history, social, surgical and family history all reviewed in electronic medical record.  No pertanent information unless stated regarding to the chief complaint.   Review of Systems:  No headache, visual changes, nausea, vomiting, diarrhea, constipation, dizziness, abdominal pain, skin rash, fevers, chills, night sweats, weight loss, swollen lymph nodes, body aches, joint swelling, , chest pain, shortness of breath, mood changes.  Positive muscle aches  Objective  Blood pressure 128/80, pulse 64, height 5\' 3"  (1.6 m), weight 160 lb (72.6 kg), SpO2 94 %.    General: No apparent distress alert and oriented x3 mood and affect normal, dressed appropriately.  HEENT: Pupils  equal, extraocular movements intact  Respiratory: Patient's speak in full sentences and does not appear short of breath  Cardiovascular: No lower extremity edema, non tender, no erythema  Skin: Warm dry intact with no signs of infection or rash on extremities or on axial skeleton.  Abdomen: Soft nontender  Neuro: Cranial nerves II through XII are intact, neurovascularly intact in all extremities with 2+ DTRs and 2+ pulses.  Lymph: No lymphadenopathy of posterior or anterior cervical chain or axillae bilaterally.  Gait mild  antalgic MSK:  tender with full range of motion and good stability and symmetric strength and tone of shoulders, elbows, , hip, knee and ankles bilaterally.  Patient back exam does have some loss of lordosis.  Some degenerative scoliosis noted as well.  Severe tightness of the [facility] test bilaterally.  Negative straight leg test. Wrist exam does show the patient does have mild positive Tinel's on the right side with a positive family.  Arthritic changes of the hands bilaterally.  Near full range of motion with good grip strength.   Impression and Recommendations:     This case required medical decision making of moderate complexity. The above documentation has been reviewed and is accurate and complete Lyndal Pulley, DO       Note: This dictation was prepared with Dragon dictation along with smaller phrase technology. Any transcriptional errors that result from this process are unintentional.

## 2018-09-12 ENCOUNTER — Ambulatory Visit (INDEPENDENT_AMBULATORY_CARE_PROVIDER_SITE_OTHER): Payer: Medicare Other | Admitting: Family Medicine

## 2018-09-12 ENCOUNTER — Ambulatory Visit (INDEPENDENT_AMBULATORY_CARE_PROVIDER_SITE_OTHER)
Admission: RE | Admit: 2018-09-12 | Discharge: 2018-09-12 | Disposition: A | Discharge status: Home / Self Care | Payer: Medicare Other | Source: Ambulatory Visit | Attending: Family Medicine | Admitting: Family Medicine

## 2018-09-12 ENCOUNTER — Encounter: Payer: Self-pay | Admitting: Family Medicine

## 2018-09-12 VITALS — BP 128/80 | HR 64 | Ht 63.0 in | Wt 160.0 lb

## 2018-09-12 DIAGNOSIS — M542 Cervicalgia: Secondary | ICD-10-CM | POA: Diagnosis not present

## 2018-09-12 DIAGNOSIS — G5603 Carpal tunnel syndrome, bilateral upper limbs: Secondary | ICD-10-CM | POA: Diagnosis not present

## 2018-09-12 DIAGNOSIS — M4156 Other secondary scoliosis, lumbar region: Secondary | ICD-10-CM | POA: Diagnosis not present

## 2018-09-12 DIAGNOSIS — M47812 Spondylosis without myelopathy or radiculopathy, cervical region: Secondary | ICD-10-CM | POA: Diagnosis not present

## 2018-09-12 NOTE — Patient Instructions (Signed)
Good to see you  Wear the braces at night Ice is yoru friend Exercises 3 times a week.  Pt will help you with the neck and carpal tunnel.  pennsaid pinkie amount topically 2 times daily as needed.  COntinue the gabapentin  See em again in 4 weeks and if not better lets try injection in the median nerve first before we do more studies.

## 2018-09-12 NOTE — Assessment & Plan Note (Signed)
Bilateral carpal tunnel.  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  Discussed icing regimen, will encourage patient to do some exercises.  Bracing at night.  Worsening symptoms consider injections

## 2018-09-12 NOTE — Assessment & Plan Note (Signed)
Arthritic changes.  Patient still has some radicular symptoms.  Patient though wants to avoid any type of surgical intervention and would not want injections we will hold on any advanced imaging at this time.  Discussed icing regimen and home exercises.  Discussed which activities to do which wants to avoid.  Follow-up with me again 6 to 12 weeks

## 2018-09-20 ENCOUNTER — Encounter: Payer: Self-pay | Admitting: Gastroenterology

## 2018-09-20 ENCOUNTER — Ambulatory Visit (AMBULATORY_SURGERY_CENTER): Payer: Medicare Other | Admitting: Gastroenterology

## 2018-09-20 VITALS — BP 161/79 | HR 68 | Temp 97.3°F | Resp 16 | Ht 63.0 in | Wt 158.0 lb

## 2018-09-20 DIAGNOSIS — Z1211 Encounter for screening for malignant neoplasm of colon: Secondary | ICD-10-CM | POA: Diagnosis not present

## 2018-09-20 DIAGNOSIS — Z86010 Personal history of colonic polyps: Secondary | ICD-10-CM | POA: Diagnosis not present

## 2018-09-20 MED ORDER — SODIUM CHLORIDE 0.9 % IV SOLN: 500.0000 mL | Freq: Once | INTRAVENOUS | Status: DC

## 2018-09-20 NOTE — Progress Notes (Signed)
A/ox3 pleased with MAC, report to RN 

## 2018-09-20 NOTE — Op Note (Signed)
[facility] Patient Name: Victoria Bryant Procedure Date: 09/20/2018 9:22 AM MRN: [id number] Endoscopist: Ladene Artist , MD Age: 72 Referring MD:  Date of Birth: 03-27-1946 Gender: Female Account #: [id number] Procedure:                Colonoscopy Indications:              Surveillance: Personal history of adenomatous                            polyps on last colonoscopy 5 years ago (one small                            tubular adenoma in 2001) Medicines:                Monitored Anesthesia Care Procedure:                Pre-Anesthesia Assessment:                           - Prior to the procedure, a History and Physical                            was performed, and patient medications and                            allergies were reviewed. The patient's tolerance of                            previous anesthesia was also reviewed. The risks                            and benefits of the procedure and the sedation                            options and risks were discussed with the patient.                            All questions were answered, and informed consent                            was obtained. Prior Anticoagulants: The patient has                            taken no previous anticoagulant or antiplatelet                            agents. ASA Grade Assessment: II - A patient with                            mild systemic disease. After reviewing the risks                            and benefits, the patient was deemed in  satisfactory condition to undergo the procedure.                           After obtaining informed consent, the colonoscope                            was passed under direct vision. Throughout the                            procedure, the patient's blood pressure, pulse, and                            oxygen saturations were monitored continuously. The                            Model PCF-[id number])  scope was introduced                            through the anus and advanced to the the cecum,                            identified by appendiceal orifice and ileocecal                            valve. The ileocecal valve, appendiceal orifice,                            and rectum were photographed. The quality of the                            bowel preparation was good. The patient tolerated                            the procedure well. The colonoscopy was somewhat                            difficult due to restricted mobility of the left                            colon and a tortuous left colon. Successful                            completion of the procedure was aided by                            withdrawing the scope and replacing with the                            pediatric endoscope, applying abdominal pressure,                            straightening the scope and loop reduction. Scope In: 9:37:47 AM Scope Out: 9:54:51 AM Scope Withdrawal Time: 0 hours 7 minutes 59 seconds  Total Procedure  Duration: 0 hours 17 minutes 4 seconds  Findings:                 The perianal and digital rectal examinations were                            normal.                           Multiple medium-mouthed diverticula were found in                            the left colon. There was narrowing of the colon in                            association with the diverticular opening. The left                            colon was tortuous with restricted mobility. There                            was evidence of diverticular spasm. There was no                            evidence of diverticular bleeding.                           The exam was otherwise without abnormality on                            direct and retroflexion views. Complications:            No immediate complications. Estimated blood loss:                            None. Estimated Blood Loss:     Estimated blood loss:  none. Impression:               - Moderate diverticulosis in the left colon.                           - The examination was otherwise normal on direct                            and retroflexion views.                           - No specimens collected. Recommendation:           - Patient has a contact number available for                            emergencies. The signs and symptoms of potential                            delayed complications were discussed with the  patient. Return to normal activities tomorrow.                            Written discharge instructions were provided to the                            patient.                           - High fiber diet.                           - Continue present medications.                           - No repeat colonoscopy due to age and the absence                            of colonic polyps. Ladene Artist, MD 09/20/2018 10:02:21 AM This report has been signed electronically.

## 2018-09-20 NOTE — Patient Instructions (Signed)
Please read handout on Diverticulosis. High Fiber diet, handout provided.     YOU HAD AN ENDOSCOPIC PROCEDURE TODAY AT [facility]:   Refer to the procedure report that was given to you for any specific questions about what was found during the examination.  If the procedure report does not answer your questions, please call your gastroenterologist to clarify.  If you requested that your care partner not be given the details of your procedure findings, then the procedure report has been included in a sealed envelope for you to review at your convenience later.  YOU SHOULD EXPECT: Some feelings of bloating in the abdomen. Passage of more gas than usual.  Walking can help get rid of the air that was put into your GI tract during the procedure and reduce the bloating. If you had a lower endoscopy (such as a colonoscopy or flexible sigmoidoscopy) you may notice spotting of blood in your stool or on the toilet paper. If you underwent a bowel prep for your procedure, you may not have a normal bowel movement for a few days.  Please Note:  You might notice some irritation and congestion in your nose or some drainage.  This is from the oxygen used during your procedure.  There is no need for concern and it should clear up in a day or so.  SYMPTOMS TO REPORT IMMEDIATELY:   Following lower endoscopy (colonoscopy or flexible sigmoidoscopy):  Excessive amounts of blood in the stool  Significant tenderness or worsening of abdominal pains  Swelling of the abdomen that is new, acute  Fever of 100F or higher    For urgent or emergent issues, a gastroenterologist can be reached at any hour by calling [phone].   DIET:  We do recommend a small meal at first, but then you may proceed to your regular diet.  Drink plenty of fluids but you should avoid alcoholic beverages for 24 hours.  ACTIVITY:  You should plan to take it easy for the rest of today and you should NOT DRIVE or use heavy  machinery until tomorrow (because of the sedation medicines used during the test).    FOLLOW UP: Our staff will call the number listed on your records the next business day following your procedure to check on you and address any questions or concerns that you may have regarding the information given to you following your procedure. If we do not reach you, we will leave a message.  However, if you are feeling well and you are not experiencing any problems, there is no need to return our call.  We will assume that you have returned to your regular daily activities without incident.  If any biopsies were taken you will be contacted by phone or by letter within the next 1-3 weeks.  Please call us at [phone] if you have not heard about the biopsies in 3 weeks.    SIGNATURES/CONFIDENTIALITY: You and/or your care partner have signed paperwork which will be entered into your electronic medical record.  These signatures attest to the fact that that the information above on your After Visit Summary has been reviewed and is understood.  Full responsibility of the confidentiality of this discharge information lies with you and/or your care-partner.

## 2018-09-20 NOTE — Progress Notes (Signed)
Pt's states no medical or surgical changes since previsit or office visit. 

## 2018-09-21 ENCOUNTER — Telehealth: Payer: Self-pay

## 2018-09-21 NOTE — Telephone Encounter (Signed)
No answer, left message to call back later today, B.Neka Bise RN. 

## 2018-09-21 NOTE — Telephone Encounter (Signed)
  Follow up Call-  Call Victoria Bryant number 09/20/2018  Post procedure Call Victoria Bryant phone  # [phone]  Permission to leave phone message Yes  Some recent data might be hidden     Patient questions:  Do you have a fever, pain , or abdominal swelling? No. Pain Score  0 *  Have you tolerated food without any problems? Yes.    Have you been able to return to your normal activities? Yes.    Do you have any questions about your discharge instructions: Diet   No. Medications  No. Follow up visit  No.  Do you have questions or concerns about your Care? No.  Actions: * If pain score is 4 or above: No action needed, pain <4.

## 2018-10-12 ENCOUNTER — Ambulatory Visit: Payer: Medicare Other | Admitting: Family Medicine

## 2018-10-23 NOTE — Progress Notes (Addendum)
Subjective:   Victoria Bryant is a 72 y.o. female who presents for Medicare Annual (Subsequent) preventive examination.  Review of Systems:  No ROS.  Medicare Wellness Visit. Additional risk factors are reflected in the social history.  Cardiac Risk Factors include: advanced age (>70men, >46 women)   Sleep patterns: Sleeps 7 hours, nap during day.  Home Safety/Smoke Alarms: Feels safe in home. Smoke alarms in place.  Living environment; residence and Firearm Safety: Lives with family in 2 story home. Husband at [facility].  Seat Belt Safety/Bike Helmet: Wears seat belt.   Female:   Pap-N/A       Mammo-12/21/2017, benign. [facility].        Dexa scan-09/30/2016, normal.  Ordered today.      CCS-Colonoscopy 09/20/2018, normal.      Objective:     Vitals: BP 132/74 (BP Location: Left Arm, Patient Position: Sitting, Cuff Size: Normal)   Pulse 71   Ht 5\' 3"  (1.6 m)   Wt 157 lb 4 oz (71.3 kg)   SpO2 98%   BMI 27.86 kg/m   Body mass index is 27.86 kg/m.  Advanced Directives 10/24/2018 06/23/2018 10/21/2017 10/06/2016 07/19/2016  Does Patient Have a Medical Advance Directive? Yes Yes Yes Yes Yes  Type of Paramedic of [place];Living will [place];Living will [place];Living will [place];Living will [place];Living will  Does patient want to make changes to medical advance directive? - - - No - Patient declined -  Copy of [place] in Chart? No - copy requested - Yes Yes -    Tobacco Social History   Tobacco Use  Smoking Status Former Smoker  . Last attempt to quit: 12/06/1973  . Years since quitting: 44.9  Smokeless Tobacco Never Used     Counseling given: Not Answered   Past Medical History:  Diagnosis Date  . Arthritis   . COLONIC POLYPS, HX OF 04/21/2007  . DEPRESSION 05/26/2009  . Diverticulitis   . GERD 04/08/2008  . Interstitial  cystitis    ? per pt: was told by urologist she has this  . 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 MENOPAUSAL SYNDROME 04/21/2007  . NECK PAIN, CHRONIC 04/21/2007   Past Surgical History:  Procedure Laterality Date  . COLONOSCOPY  x 2   First time polyps, second time no polyps: recall 10 yrs (next is approx 2024 per pt report)  . INCISION AND DRAINAGE ABSCESS ANAL    . POLYPECTOMY    . TONSILLECTOMY    . UPPER GASTROINTESTINAL ENDOSCOPY    . VAGINAL HYSTERECTOMY     Nonmalignant reason.  Ovaries still in.   Family History  Problem Relation Age of Onset  . Bladder Cancer Mother   . Lung cancer Mother   . Breast cancer Paternal Aunt   . ALS Daughter   . Fibromyalgia Sister   . Heart disease Brother   . Alcohol abuse Brother   . Colon cancer Neg Hx   . Colon polyps Neg Hx   . Esophageal cancer Neg Hx   . Rectal cancer Neg Hx   . Stomach cancer Neg Hx    Social History   Socioeconomic History  . Marital status: Married    Spouse name: Not on file  . Number of children: Not on file  . Years of education: Not on file  . Highest education level: Not on file  Occupational History  . Not  on file  Social Needs  . Financial resource strain: Not on file  . Food insecurity:    Worry: Not on file    Inability: Not on file  . Transportation needs:    Medical: Not on file    Non-medical: Not on file  Tobacco Use  . Smoking status: Former Smoker    Last attempt to quit: 12/06/1973    Years since quitting: 44.9  . Smokeless tobacco: Never Used  Substance and Sexual Activity  . Alcohol use: No  . Drug use: No  . Sexual activity: Not on file  Lifestyle  . Physical activity:    Days per week: Not on file    Minutes per session: Not on file  . Stress: Not on file  Relationships  . Social connections:    Talks on phone: Not on file    Gets together: Not on file    Attends religious service: Not on file    Active member of club or organization: Not on file    Attends  meetings of clubs or organizations: Not on file    Relationship status: Not on file  Other Topics Concern  . Not on file  Social History Narrative   Married, one daughter lives next door to her.   Retired Art gallery manager.   Tob: 20 pack yr hx, quit age 50.   No alc.    Outpatient Encounter Medications as of 10/24/2018  Medication Sig  . ALPRAZolam (XANAX) 0.5 MG tablet Take 1 tablet (0.5 mg total) by mouth daily.  . Ascorbic Acid (VITAMIN C) 1000 MG tablet Take 1,000 mg by mouth daily.  . Baclofen 5 MG TABS Take 5 mg by mouth 2 (two) times daily as needed.  . Biotin w/ Vitamins C & E (HAIR/SKIN/NAILS PO) Take by mouth.  . Cyanocobalamin (VITAMIN B 12 PO) Take by mouth.  . fluticasone (FLONASE) 50 MCG/ACT nasal spray Place 1 spray into both nostrils daily.  . meloxicam (MOBIC) 15 MG tablet Take 1 tablet (15 mg total) by mouth daily.  . Misc Natural Products (TART CHERRY ADVANCED PO) Take by mouth.  . Multiple Vitamins-Minerals (ZINC PO) Take by mouth.  . Nutritional Supplements (JUICE PLUS FIBRE PO) Take by mouth.  . pantoprazole (PROTONIX) 40 MG tablet Take 1 tablet (40 mg total) by mouth daily.  . Probiotic Product (RA PROBIOTIC GUMMIES PO) Take by mouth.  . tolterodine (DETROL LA) 4 MG 24 hr capsule Take 4 mg by mouth daily as needed.  . TURMERIC PO Take by mouth.  . Vitamin D, Ergocalciferol, (DRISDOL) 50000 units CAPS capsule Take 1 capsule (50,000 Units total) by mouth every 7 (seven) days.  . diazepam (VALIUM) 10 MG tablet TAKE 1 TABLET PER VAGINA EVERY DAY AT BEDTIME  . gabapentin (NEURONTIN) 100 MG capsule Take 1 capsule (100 mg total) by mouth 3 (three) times daily. (Patient not taking: Reported on 10/24/2018)  . hyoscyamine (LEVSIN/SL) 0.125 MG SL tablet Place 1 tablet (0.125 mg total) under the tongue every 6 (six) hours as needed. (Patient not taking: Reported on 09/20/2018)  . Zoster Vaccine Adjuvanted [facility]) injection Inject 0.5 mLs into the muscle once for 1  dose.   No facility-administered encounter medications on file as of 10/24/2018.     Activities of Daily Living In your present state of health, do you have any difficulty performing the following activities: 10/24/2018  Hearing? N  Vision? N  Difficulty concentrating or making decisions? N  Walking or climbing stairs?  N  Dressing or bathing? N  Doing errands, shopping? N  Preparing Food and eating ? N  Using the Toilet? N  In the past six months, have you accidently leaked urine? N  Do you have problems with loss of bowel control? N  Managing your Medications? N  Managing your Finances? N  Housekeeping or managing your Housekeeping? N  Some recent data might be hidden    Patient Care Team: [name], DO as PCP - General (Family Medicine) [name], MD as Consulting Physician (Obstetrics and Gynecology) [name], MD (Inactive) as Consulting Physician (Gastroenterology) Ladene Artist, MD as Consulting Physician (Gastroenterology) Lyndal Pulley, DO as Consulting Physician (Family Medicine) Allyn Kenner, MD (Dermatology)    Assessment:   This is a routine wellness examination for Victoria Bryant.  Exercise Activities and Dietary recommendations Current Exercise Habits: Home exercise routine, Type of exercise: walking, Exercise limited by: None identified Diet (meal preparation, eat out, water intake, caffeinated beverages, dairy products, fruits and vegetables): Drinks water.   Breakfast: Eggs, toast, potato, coffee  Lunch: Subway sandwich  Dinner: Soup  Goals    . Increase physical activity     Increase activity.     . Patient Stated     Maintain healthy lifestyle.        Fall Risk Fall Risk  10/24/2018 05/04/2018 10/21/2017 10/12/2017 10/06/2016  Falls in the past year? 0 No No No No    Depression Screen PHQ 2/9 Scores 10/24/2018 03/15/2018 11/21/2017 10/21/2017  PHQ - 2 Score 0 0 0 2  PHQ- 9 Score - - 1 5     Cognitive Function MMSE - Mini  Mental State Exam 10/24/2018 10/21/2017  Orientation to time 5 5  Orientation to Place 5 5  Registration 3 3  Attention/ Calculation 5 5  Recall 2 3  Language- name 2 objects 2 2  Language- repeat 1 1  Language- follow 3 step command 3 3  Language- read & follow direction 1 1  Write a sentence 1 1  Copy design 1 1  Total score 29 30        Immunization History  Administered Date(s) Administered  . Influenza Split 09/20/2011, 09/25/2012  . Influenza Whole 08/27/2010  . Influenza, High Dose Seasonal PF 10/06/2015, 12/26/2017, 09/08/2018  . Influenza,inj,Quad PF,6+ Mos 09/26/2013  . Influenza-Unspecified 09/17/2014  . Pneumococcal Conjugate-13 10/06/2015  . Pneumococcal Polysaccharide-23 09/20/2011  . Td 05/26/2009  . Zoster 09/25/2012   Screening Tests Health Maintenance  Topic Date Due  . MAMMOGRAM  12/21/2018  . TETANUS/TDAP  05/27/2019  . INFLUENZA VACCINE  Completed  . DEXA SCAN  Completed  . Hepatitis C Screening  Completed  . PNA vac Low Risk Adult  Completed        Plan:     Shingles vaccine at pharmacy.   Schedule eye exam.   Schedule bone scan.   Continue doing brain stimulating activities (puzzles, reading, adult coloring books, staying active) to keep memory sharp.   Bring a copy of your living will and/or healthcare power of attorney to your next office visit.   I have personally reviewed and noted the following in the patient's chart:   . Medical and social history . Use of alcohol, tobacco or illicit drugs  . Current medications and supplements . Functional ability and status . Nutritional status . Physical activity . Advanced directives . List of other physicians . Hospitalizations, surgeries, and ER visits in previous 12 months . Vitals . Screenings  to include cognitive, depression, and falls . Referrals and appointments  In addition, I have reviewed and discussed with patient certain preventive protocols, quality metrics, and best  practice recommendations. A written personalized care plan for preventive services as well as general preventive health recommendations were provided to patient.     Gerilyn Nestle, RN  10/24/2018  PCP Notes: -Pt with family stressors (daughter with ALS recently passed; husband currently hospitalized, recent amputee, dialysis, residing at [facility]) -PHQ9=0 -Will call for f/u with PCP  Medical screening examination/treatment/procedure(s) were performed by non-physician practitioner and as supervising physician I was immediately available for consultation/collaboration.  I agree with above assessment and plan.  Electronically Signed by: Howard Pouch, DO [facility] [place]

## 2018-10-24 ENCOUNTER — Ambulatory Visit (INDEPENDENT_AMBULATORY_CARE_PROVIDER_SITE_OTHER): Payer: Medicare Other

## 2018-10-24 ENCOUNTER — Other Ambulatory Visit: Payer: Self-pay

## 2018-10-24 VITALS — BP 132/74 | HR 71 | Ht 63.0 in | Wt 157.2 lb

## 2018-10-24 DIAGNOSIS — E2839 Other primary ovarian failure: Secondary | ICD-10-CM

## 2018-10-24 DIAGNOSIS — Z0000 Encounter for general adult medical examination without abnormal findings: Secondary | ICD-10-CM | POA: Diagnosis not present

## 2018-10-24 DIAGNOSIS — Z23 Encounter for immunization: Secondary | ICD-10-CM

## 2018-10-24 MED ORDER — ZOSTER VAC RECOMB ADJUVANTED 50 MCG/0.5ML IM SUSR: 0.5000 mL | Freq: Once | INTRAMUSCULAR | 1 refills | Status: AC

## 2018-10-24 NOTE — Patient Instructions (Addendum)
Shingles vaccine at pharmacy.   Schedule eye exam.   Schedule bone scan.   Continue doing brain stimulating activities (puzzles, reading, adult coloring books, staying active) to keep memory sharp.   Bring a copy of your living will and/or healthcare power of attorney to your next office visit.  Health Maintenance, Female Adopting a healthy lifestyle and getting preventive care can go a long way to promote health and wellness. Talk with your health care provider about what schedule of regular examinations is right for you. This is a good chance for you to check in with your provider about disease prevention and staying healthy. In between checkups, there are plenty of things you can do on your own. Experts have done a lot of research about which lifestyle changes and preventive measures are most likely to keep you healthy. Ask your health care provider for more information. Weight and diet Eat a healthy diet  Be sure to include plenty of vegetables, fruits, low-fat dairy products, and lean protein.  Do not eat a lot of foods high in solid fats, added sugars, or salt.  Get regular exercise. This is one of the most important things you can do for your health. ? Most adults should exercise for at least 150 minutes each week. The exercise should increase your heart rate and make you sweat (moderate-intensity exercise). ? Most adults should also do strengthening exercises at least twice a week. This is in addition to the moderate-intensity exercise.  Maintain a healthy weight  Body mass index (BMI) is a measurement that can be used to identify possible weight problems. It estimates body fat based on height and weight. Your health care provider can help determine your BMI and help you achieve or maintain a healthy weight.  For females 6 years of age and older: ? A BMI below 18.5 is considered underweight. ? A BMI of 18.5 to 24.9 is normal. ? A BMI of 25 to 29.9 is considered  overweight. ? A BMI of 30 and above is considered obese.  Watch levels of cholesterol and blood lipids  You should start having your blood tested for lipids and cholesterol at 73 years of age, then have this test every 5 years.  You may need to have your cholesterol levels checked more often if: ? Your lipid or cholesterol levels are high. ? You are older than 73 years of age. ? You are at high risk for heart disease.  Cancer screening Lung Cancer  Lung cancer screening is recommended for adults 62-56 years old who are at high risk for lung cancer because of a history of smoking.  A yearly low-dose CT scan of the lungs is recommended for people who: ? Currently smoke. ? Have quit within the past 15 years. ? Have at least a 30-pack-year history of smoking. A pack year is smoking an average of one pack of cigarettes a day for 1 year.  Yearly screening should continue until it has been 15 years since you quit.  Yearly screening should stop if you develop a health problem that would prevent you from having lung cancer treatment.  Breast Cancer  Practice breast self-awareness. This means understanding how your breasts normally appear and feel.  It also means doing regular breast self-exams. Let your health care provider know about any changes, no matter how small.  If you are in your 20s or 30s, you should have a clinical breast exam (CBE) by a health care provider every 1-3 years as part  of a regular health exam.  If you are 30 or older, have a CBE every year. Also consider having a breast X-ray (mammogram) every year.  If you have a family history of breast cancer, talk to your health care provider about genetic screening.  If you are at high risk for breast cancer, talk to your health care provider about having an MRI and a mammogram every year.  Breast cancer gene (BRCA) assessment is recommended for women who have family members with BRCA-related cancers. BRCA-related cancers  include: ? Breast. ? Ovarian. ? Tubal. ? Peritoneal cancers.  Results of the assessment will determine the need for genetic counseling and BRCA1 and BRCA2 testing.  Cervical Cancer Your health care provider may recommend that you be screened regularly for cancer of the pelvic organs (ovaries, uterus, and vagina). This screening involves a pelvic examination, including checking for microscopic changes to the surface of your cervix (Pap test). You may be encouraged to have this screening done every 3 years, beginning at age 70.  For women ages 17-65, health care providers may recommend pelvic exams and Pap testing every 3 years, or they may recommend the Pap and pelvic exam, combined with testing for human papilloma virus (HPV), every 5 years. Some types of HPV increase your risk of cervical cancer. Testing for HPV may also be done on women of any age with unclear Pap test results.  Other health care providers may not recommend any screening for nonpregnant women who are considered low risk for pelvic cancer and who do not have symptoms. Ask your health care provider if a screening pelvic exam is right for you.  If you have had past treatment for cervical cancer or a condition that could lead to cancer, you need Pap tests and screening for cancer for at least 20 years after your treatment. If Pap tests have been discontinued, your risk factors (such as having a new sexual partner) need to be reassessed to determine if screening should resume. Some women have medical problems that increase the chance of getting cervical cancer. In these cases, your health care provider may recommend more frequent screening and Pap tests.  Colorectal Cancer  This type of cancer can be detected and often prevented.  Routine colorectal cancer screening usually begins at 73 years of age and continues through 73 years of age.  Your health care provider may recommend screening at an earlier age if you have risk factors  for colon cancer.  Your health care provider may also recommend using home test kits to check for hidden blood in the stool.  A small camera at the end of a tube can be used to examine your colon directly (sigmoidoscopy or colonoscopy). This is done to check for the earliest forms of colorectal cancer.  Routine screening usually begins at age 85.  Direct examination of the colon should be repeated every 5-10 years through 73 years of age. However, you may need to be screened more often if early forms of precancerous polyps or small growths are found.  Skin Cancer  Check your skin from head to toe regularly.  Tell your health care provider about any new moles or changes in moles, especially if there is a change in a mole's shape or color.  Also tell your health care provider if you have a mole that is larger than the size of a pencil eraser.  Always use sunscreen. Apply sunscreen liberally and repeatedly throughout the day.  Protect yourself by wearing long  sleeves, pants, a wide-brimmed hat, and sunglasses whenever you are outside.  Heart disease, diabetes, and high blood pressure  High blood pressure causes heart disease and increases the risk of stroke. High blood pressure is more likely to develop in: ? People who have blood pressure in the high end of the normal range (130-139/85-89 mm Hg). ? People who are overweight or obese. ? People who are African American.  If you are 93-79 years of age, have your blood pressure checked every 3-5 years. If you are 85 years of age or older, have your blood pressure checked every year. You should have your blood pressure measured twice-once when you are at a hospital or clinic, and once when you are not at a hospital or clinic. Record the average of the two measurements. To check your blood pressure when you are not at a hospital or clinic, you can use: ? An automated blood pressure machine at a pharmacy. ? A home blood pressure monitor.  If  you are between 74 years and 26 years old, ask your health care provider if you should take aspirin to prevent strokes.  Have regular diabetes screenings. This involves taking a blood sample to check your fasting blood sugar level. ? If you are at a normal weight and have a low risk for diabetes, have this test once every three years after 73 years of age. ? If you are overweight and have a high risk for diabetes, consider being tested at a younger age or more often. Preventing infection Hepatitis B  If you have a higher risk for hepatitis B, you should be screened for this virus. You are considered at high risk for hepatitis B if: ? You were born in a country where hepatitis B is common. Ask your health care provider which countries are considered high risk. ? Your parents were born in a high-risk country, and you have not been immunized against hepatitis B (hepatitis B vaccine). ? You have HIV or AIDS. ? You use needles to inject street drugs. ? You live with someone who has hepatitis B. ? You have had sex with someone who has hepatitis B. ? You get hemodialysis treatment. ? You take certain medicines for conditions, including cancer, organ transplantation, and autoimmune conditions.  Hepatitis C  Blood testing is recommended for: ? Everyone born from [birth year] through 1966. ? Anyone with known risk factors for hepatitis C.  Sexually transmitted infections (STIs)  You should be screened for sexually transmitted infections (STIs) including gonorrhea and chlamydia if: ? You are sexually active and are younger than 73 years of age. ? You are older than 73 years of age and your health care provider tells you that you are at risk for this type of infection. ? Your sexual activity has changed since you were last screened and you are at an increased risk for chlamydia or gonorrhea. Ask your health care provider if you are at risk.  If you do not have HIV, but are at risk, it may be recommended  that you take a prescription medicine daily to prevent HIV infection. This is called pre-exposure prophylaxis (PrEP). You are considered at risk if: ? You are sexually active and do not regularly use condoms or know the HIV status of your partner(s). ? You take drugs by injection. ? You are sexually active with a partner who has HIV.  Talk with your health care provider about whether you are at high risk of being infected with HIV. If  you choose to begin PrEP, you should first be tested for HIV. You should then be tested every 3 months for as long as you are taking PrEP. Pregnancy  If you are premenopausal and you may become pregnant, ask your health care provider about preconception counseling.  If you may become pregnant, take 400 to 800 micrograms (mcg) of folic acid every day.  If you want to prevent pregnancy, talk to your health care provider about birth control (contraception). Osteoporosis and menopause  Osteoporosis is a disease in which the bones lose minerals and strength with aging. This can result in serious bone fractures. Your risk for osteoporosis can be identified using a bone density scan.  If you are 51 years of age or older, or if you are at risk for osteoporosis and fractures, ask your health care provider if you should be screened.  Ask your health care provider whether you should take a calcium or vitamin D supplement to lower your risk for osteoporosis.  Menopause may have certain physical symptoms and risks.  Hormone replacement therapy may reduce some of these symptoms and risks. Talk to your health care provider about whether hormone replacement therapy is right for you. Follow these instructions at home:  Schedule regular health, dental, and eye exams.  Stay current with your immunizations.  Do not use any tobacco products including cigarettes, chewing tobacco, or electronic cigarettes.  If you are pregnant, do not drink alcohol.  If you are  breastfeeding, limit how much and how often you drink alcohol.  Limit alcohol intake to no more than 1 drink per day for nonpregnant women. One drink equals 12 ounces of beer, 5 ounces of wine, or 1 ounces of hard liquor.  Do not use street drugs.  Do not share needles.  Ask your health care provider for help if you need support or information about quitting drugs.  Tell your health care provider if you often feel depressed.  Tell your health care provider if you have ever been abused or do not feel safe at home. This information is not intended to replace advice given to you by your health care provider. Make sure you discuss any questions you have with your health care provider. Document Released: 06/07/2011 Document Revised: 04/29/2016 Document Reviewed: 08/26/2015 Elsevier Interactive Patient Education  [employer].

## 2018-10-27 ENCOUNTER — Emergency Department (HOSPITAL_BASED_OUTPATIENT_CLINIC_OR_DEPARTMENT_OTHER)
Admission: EM | Admit: 2018-10-27 | Discharge: 2018-10-27 | Disposition: A | Discharge status: Home / Self Care | Payer: Medicare Other | Attending: Emergency Medicine | Admitting: Emergency Medicine

## 2018-10-27 ENCOUNTER — Other Ambulatory Visit: Payer: Self-pay

## 2018-10-27 ENCOUNTER — Emergency Department (HOSPITAL_BASED_OUTPATIENT_CLINIC_OR_DEPARTMENT_OTHER): Payer: Medicare Other

## 2018-10-27 ENCOUNTER — Encounter (HOSPITAL_BASED_OUTPATIENT_CLINIC_OR_DEPARTMENT_OTHER): Payer: Self-pay | Admitting: Emergency Medicine

## 2018-10-27 DIAGNOSIS — K5732 Diverticulitis of large intestine without perforation or abscess without bleeding: Secondary | ICD-10-CM | POA: Diagnosis not present

## 2018-10-27 DIAGNOSIS — K219 Gastro-esophageal reflux disease without esophagitis: Secondary | ICD-10-CM | POA: Diagnosis not present

## 2018-10-27 DIAGNOSIS — R0602 Shortness of breath: Secondary | ICD-10-CM | POA: Diagnosis not present

## 2018-10-27 DIAGNOSIS — Z87891 Personal history of nicotine dependence: Secondary | ICD-10-CM | POA: Diagnosis not present

## 2018-10-27 DIAGNOSIS — R1084 Generalized abdominal pain: Secondary | ICD-10-CM | POA: Diagnosis not present

## 2018-10-27 DIAGNOSIS — K5730 Diverticulosis of large intestine without perforation or abscess without bleeding: Secondary | ICD-10-CM | POA: Diagnosis not present

## 2018-10-27 DIAGNOSIS — K5792 Diverticulitis of intestine, part unspecified, without perforation or abscess without bleeding: Secondary | ICD-10-CM | POA: Diagnosis not present

## 2018-10-27 LAB — URINALYSIS, ROUTINE W REFLEX MICROSCOPIC
BILIRUBIN URINE: NEGATIVE
GLUCOSE, UA: NEGATIVE mg/dL
Ketones, ur: 15 mg/dL — AB
Leukocytes, UA: NEGATIVE
Nitrite: NEGATIVE
PH: 6.5 (ref 5.0–8.0)
Protein, ur: NEGATIVE mg/dL
Specific Gravity, Urine: <1.005 — ABNORMAL LOW (ref 1.005–1.030)

## 2018-10-27 LAB — I-STAT CHEM 8, ED
BUN: 11 mg/dL (ref 8–23)
CALCIUM ION: 1.16 mmol/L (ref 1.15–1.40)
CREATININE: 0.6 mg/dL (ref 0.44–1.00)
Chloride: 105 mmol/L (ref 98–111)
GLUCOSE: 110 mg/dL — AB (ref 70–99)
HCT: 41 % (ref 36.0–46.0)
Hemoglobin: 13.9 g/dL (ref 12.0–15.0)
POTASSIUM: 3.7 mmol/L (ref 3.5–5.1)
Sodium: 139 mmol/L (ref 135–145)
TCO2: 24 mmol/L (ref 22–32)

## 2018-10-27 LAB — CBC
HEMATOCRIT: 41.6 % (ref 36.0–46.0)
HEMOGLOBIN: 13.5 g/dL (ref 12.0–15.0)
MCH: 29.6 pg (ref 26.0–34.0)
MCHC: 32.5 g/dL (ref 30.0–36.0)
MCV: 91.2 fL (ref 80.0–100.0)
Platelets: 243 10*3/uL (ref 150–400)
RBC: 4.56 MIL/uL (ref 3.87–5.11)
RDW: 11.9 % (ref 11.5–15.5)
WBC: 12.3 10*3/uL — ABNORMAL HIGH (ref 4.0–10.5)
nRBC: 0 % (ref 0.0–0.2)

## 2018-10-27 LAB — URINALYSIS, MICROSCOPIC (REFLEX)

## 2018-10-27 LAB — TROPONIN I: Troponin I: <0.03 ng/mL (ref <0.03)

## 2018-10-27 MED ORDER — AMOXICILLIN-POT CLAVULANATE 875-125 MG PO TABS
1.0000 | ORAL_TABLET | Freq: Once | ORAL | Status: AC
  Administered 2018-10-27: 1 via ORAL
  Filled 2018-10-27: qty 1

## 2018-10-27 MED ORDER — ONDANSETRON HCL 4 MG/2ML IJ SOLN
4.0000 mg | Freq: Once | INTRAMUSCULAR | Status: AC
  Administered 2018-10-27: 4 mg via INTRAVENOUS
  Filled 2018-10-27: qty 2

## 2018-10-27 MED ORDER — AMOXICILLIN-POT CLAVULANATE 875-125 MG PO TABS: 1.0000 | ORAL_TABLET | Freq: Two times a day (BID) | ORAL | 0 refills | Status: AC

## 2018-10-27 MED ORDER — TRAMADOL HCL 50 MG PO TABS: 50.0000 mg | ORAL_TABLET | Freq: Four times a day (QID) | ORAL | 0 refills | Status: DC | PRN

## 2018-10-27 MED ORDER — MORPHINE SULFATE (PF) 4 MG/ML IV SOLN
4.0000 mg | Freq: Once | INTRAVENOUS | Status: AC
  Administered 2018-10-27: 4 mg via INTRAVENOUS
  Filled 2018-10-27: qty 1

## 2018-10-27 MED ORDER — ONDANSETRON 4 MG PO TBDP: 4.0000 mg | ORAL_TABLET | Freq: Three times a day (TID) | ORAL | 0 refills | Status: DC | PRN

## 2018-10-27 MED ORDER — IOPAMIDOL (ISOVUE-300) INJECTION 61%
100.0000 mL | Freq: Once | INTRAVENOUS | Status: AC | PRN
  Administered 2018-10-27: 100 mL via INTRAVENOUS

## 2018-10-27 MED FILL — traMADol HCL 50 MG TABS: 50 | 2 days supply | Qty: 8 | Fill #0

## 2018-10-27 MED FILL — AMOX-CLAV 875-125 MG TABLET: 875-125 | 7 days supply | Qty: 14 | Fill #0

## 2018-10-27 MED FILL — ONDANSETRON ODT 4 MG TABLET: 4 | 6 days supply | Qty: 20 | Fill #0

## 2018-10-27 NOTE — ED Triage Notes (Signed)
Patient states severe generalized abdominal pain since last night that feels like diverticulitis that she has had in the past. Denies N/V/D.

## 2018-10-27 NOTE — Discharge Instructions (Signed)
We believe your symptoms are caused by diverticulitis.  Most of the time this condition (please read through the included information) can be cured with outpatient antibiotics.  Please take the full course of prescribed medication(s) and follow up with the doctors recommended above.  Return to the ED if your abdominal pain worsens or fails to improve, you develop bloody vomiting, bloody diarrhea, you are unable to tolerate fluids due to vomiting, fever greater than 101, or other symptoms that concern you.  Take Tramadol as prescribed for severe pain. Do not drink alcohol, drive or participate in any other potentially dangerous activities while taking this medication as it may make you sleepy. Do not take this medication with any other sedating medications, either prescription or over-the-counter.    This medication is an opiate (or narcotic) pain medication and can be habit forming.  Use it as little as possible to achieve adequate pain control.  Do not use or use it with extreme caution if you have a history of opiate abuse or dependence. This medication is intended for your use only - do not give any to anyone else and keep it in a secure place where nobody else, especially children, have access to it.  It will also cause or worsen constipation, so you may want to consider taking an over-the-counter stool softener while you are taking this medication.   Diverticulitis Diverticulitis is inflammation or infection of small pouches in your colon that form when you have a condition called diverticulosis. The pouches in your colon are called diverticula. Your colon, or large intestine, is where water is absorbed and stool is formed. Complications of diverticulitis can include: Bleeding. Severe infection. Severe pain. Perforation of your colon. Obstruction of your colon. CAUSES  Diverticulitis is caused by bacteria. Diverticulitis happens when stool becomes trapped in diverticula. This allows bacteria  to grow in the diverticula, which can lead to inflammation and infection. RISK FACTORS People with diverticulosis are at risk for diverticulitis. Eating a diet that does not include enough fiber from fruits and vegetables may make diverticulitis more likely to develop. SYMPTOMS  Symptoms of diverticulitis may include: Abdominal pain and tenderness. The pain is normally located on the left side of the abdomen, but may occur in other areas. Fever and chills. Bloating. Cramping. Nausea. Vomiting. Constipation. Diarrhea. Blood in your stool. DIAGNOSIS  Your health care provider will ask you about your medical history and do a physical exam. You may need to have tests done because many medical conditions can cause the same symptoms as diverticulitis. Tests may include: Blood tests. Urine tests. Imaging tests of the abdomen, including X-rays and CT scans. When your condition is under control, your health care provider may recommend that you have a colonoscopy. A colonoscopy can show how severe your diverticula are and whether something else is causing your symptoms. TREATMENT  Most cases of diverticulitis are mild and can be treated at home. Treatment may include: Taking over-the-counter pain medicines. Following a clear liquid diet. Taking antibiotic medicines by mouth for 7-10 days. More severe cases may be treated at a hospital. Treatment may include: Not eating or drinking. Taking prescription pain medicine. Receiving antibiotic medicines through an IV tube. Receiving fluids and nutrition through an IV tube. Surgery. HOME CARE INSTRUCTIONS  Follow your health care provider's instructions carefully. Follow a full liquid diet or other diet as directed by your health care provider. After your symptoms improve, your health care provider may tell you to change your diet. He or  she may recommend you eat a high-fiber diet. Fruits and vegetables are good sources of fiber. Fiber makes it easier  to pass stool. Take fiber supplements or probiotics as directed by your health care provider. Only take medicines as directed by your health care provider. Keep all your follow-up appointments. SEEK MEDICAL CARE IF:  Your pain does not improve. You have a hard time eating food. Your bowel movements do not return to normal. SEEK IMMEDIATE MEDICAL CARE IF:  Your pain becomes worse. Your symptoms do not get better. Your symptoms suddenly get worse. You have a fever. You have repeated vomiting. You have bloody or black, tarry stools. MAKE SURE YOU:  Understand these instructions. Will watch your condition. Will get help right away if you are not doing well or get worse. Document Released: 09/01/2005 Document Revised: 11/27/2013 Document Reviewed: 10/17/2013 [facility] Patient Information 2015 [place], [place]. This information is not intended to replace advice given to you by your health care provider. Make sure you discuss any questions you have with your health care provider.

## 2018-10-27 NOTE — ED Provider Notes (Signed)
Emergency Department Provider Note   I have reviewed the triage vital signs and the nursing notes.   HISTORY  Chief Complaint Abdominal Pain   HPI Victoria Bryant is a 72 y.o. female with PMH of GERD, arthritis, and diverticulitis presents to the emergency department for evaluation of generalized abdominal pain worse in the lower abdomen.  Patient describes this feeling as similar to prior episodes of diverticulitis in the past.  She has not had any nausea, vomiting, diarrhea.  She states that her stool is more thin than normal.  Reports having a normal colonoscopy earlier this year.  She reports some subjective fever last night.  She states that she was up most of the night with pain symptoms.    On the drive into the emergency department this morning she was having a mild pain in the left chest that has since resolved.  No shortness of breath or heart palpitations.  No similar pain in the past.  Past Medical History:  Diagnosis Date  . Arthritis   . COLONIC POLYPS, HX OF 04/21/2007  . DEPRESSION 05/26/2009  . Diverticulitis   . GERD 04/08/2008  . Interstitial cystitis    ? per pt: was told by urologist she has this  . 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 MENOPAUSAL SYNDROME 04/21/2007  . NECK PAIN, CHRONIC 04/21/2007    Patient Active Problem List   Diagnosis Date Noted  . Bilateral carpal tunnel syndrome 09/12/2018  . Pain emptying bladder 06/22/2018  . Scoliosis of lumbar region due to degenerative disease of spine in adult 05/31/2018  . Atherosclerosis of aorta ([place]) 05/31/2018  . Sciatica of right side associated with disorder of lumbar spine 05/04/2018  . Numbness and tingling of right leg 05/04/2018  . Sleep disturbance 06/30/2017  . Elevated TSH 06/09/2017  . Diverticulosis of intestine without bleeding 06/09/2017  . Prediabetes 04/14/2017  . Depression with anxiety 05/26/2009  . GERD 04/08/2008  . History of colonic polyps 04/21/2007    Past Surgical  History:  Procedure Laterality Date  . COLONOSCOPY  x 2   First time polyps, second time no polyps: recall 10 yrs (next is approx 2024 per pt report)  . INCISION AND DRAINAGE ABSCESS ANAL    . POLYPECTOMY    . TONSILLECTOMY    . UPPER GASTROINTESTINAL ENDOSCOPY    . VAGINAL HYSTERECTOMY     Nonmalignant reason.  Ovaries still in.    Allergies Naproxen; Paroxetine; Clarithromycin; Doxycycline hyclate; Erythromycin ethylsuccinate; and Lexapro [escitalopram oxalate]  Family History  Problem Relation Age of Onset  . Bladder Cancer Mother   . Lung cancer Mother   . Breast cancer Paternal Aunt   . ALS Daughter   . Fibromyalgia Sister   . Heart disease Brother   . Alcohol abuse Brother   . Colon cancer Neg Hx   . Colon polyps Neg Hx   . Esophageal cancer Neg Hx   . Rectal cancer Neg Hx   . Stomach cancer Neg Hx     Social History Social History   Tobacco Use  . Smoking status: Former Smoker    Last attempt to quit: 12/06/1973    Years since quitting: 44.9  . Smokeless tobacco: Never Used  Substance Use Topics  . Alcohol use: No  . Drug use: No    Review of Systems  Constitutional: Positive subjective fever.  Eyes: No visual changes. ENT: No sore throat. Cardiovascular: Positive chest pain. Respiratory: Denies shortness of breath. Gastrointestinal: Positive abdominal  pain.  No nausea, no vomiting.  No diarrhea.  No constipation. Genitourinary: Negative for dysuria. Musculoskeletal: Negative for back pain. Skin: Negative for rash. Neurological: Negative for headaches, focal weakness or numbness.  10-point ROS otherwise negative.  ____________________________________________   PHYSICAL EXAM:  VITAL SIGNS: ED Triage Vitals [10/27/18 0805]  Enc Vitals Group     BP (!) 159/81     Pulse Rate 84     Resp 16     Temp 98 F (36.7 C)     Temp Source Oral     SpO2 97 %     Pain Score 10   Constitutional: Alert and oriented. Well appearing and in no acute  distress. Eyes: Conjunctivae are normal.  Head: Atraumatic. Nose: No congestion/rhinnorhea. Mouth/Throat: Mucous membranes are moist.  Neck: No stridor.   Cardiovascular: Normal rate, regular rhythm. Good peripheral circulation. Grossly normal heart sounds.   Respiratory: Normal respiratory effort.  No retractions. Lungs CTAB. Gastrointestinal: Soft with mild diffuse tenderness worse in the lower abdomen without rebound or guarding. No distention.  Musculoskeletal: No lower extremity tenderness nor edema. No gross deformities of extremities. Neurologic:  Normal speech and language. No gross focal neurologic deficits are appreciated.  Skin:  Skin is warm, dry and intact. No rash noted.  ____________________________________________   LABS (all labs ordered are listed, but only abnormal results are displayed)  Labs Reviewed  CBC - Abnormal; Notable for the following components:      Result Value   WBC 12.3 (*)    All other components within normal limits  URINALYSIS, ROUTINE W REFLEX MICROSCOPIC - Abnormal; Notable for the following components:   Specific Gravity, Urine <1.005 (*)    Hgb urine dipstick TRACE (*)    Ketones, ur 15 (*)    All other components within normal limits  URINALYSIS, MICROSCOPIC (REFLEX) - Abnormal; Notable for the following components:   Bacteria, UA MANY (*)    All other components within normal limits  I-STAT CHEM 8, ED - Abnormal; Notable for the following components:   Glucose, Bld 110 (*)    All other components within normal limits  TROPONIN I   ____________________________________________  EKG   EKG Interpretation  Date/Time:  Friday October 27 2018 08:22:17 EST Ventricular Rate:  78 PR Interval:    QRS Duration: 100 QT Interval:  368 QTC Calculation: 420 R Axis:   53 Text Interpretation:  Sinus rhythm No STEMI.  Confirmed by Nanda Quinton [id number] on 10/27/2018 8:27:04 AM        ____________________________________________  RADIOLOGY  Dg Chest 2 View  Result Date: 10/27/2018 CLINICAL DATA:  Shortness of breath. EXAM: CHEST - 2 VIEW COMPARISON:  10/12/2017. FINDINGS: Mediastinum and hilar structures normal. Heart size normal. Questionable tiny nodular opacity noted over the right mid lung. This may represent a vessel on end. Follow-up PA lateral chest x-ray suggested. If this tiny nodular opacity persists nonenhanced chest CT suggested to further evaluate. No pleural effusion or pneumothorax. Heart size normal. No acute bony abnormality. IMPRESSION: Questionable tiny nodular opacity noted over the right mid lung. This may represent a vessel on end. Follow-up PA lateral chest x-ray suggested. If this nodular opacity persists nonenhanced chest CT suggested to further evaluate. Exam otherwise negative. Electronically Signed   By: Marcello Moores  Register   On: 10/27/2018 09:25   Ct Abdomen Pelvis W Contrast  Result Date: 10/27/2018 CLINICAL DATA:  Lower abdominal pain, fever EXAM: CT ABDOMEN AND PELVIS WITH CONTRAST TECHNIQUE: Multidetector CT imaging of the  abdomen and pelvis was performed using the standard protocol following bolus administration of intravenous contrast. CONTRAST:  137mL ISOVUE-300 IOPAMIDOL (ISOVUE-300) INJECTION 61% COMPARISON:  06/23/2018 FINDINGS: Lower chest: Lung bases are clear. No effusions. Heart is normal size. Hepatobiliary: Fatty infiltration of the liver. No focal abnormality or biliary ductal dilatation. Gallbladder unremarkable. Pancreas: No focal abnormality or ductal dilatation. Spleen: Small hypodensity posteriorly within the spleen is stable, likely small cyst or hemangioma. Normal size. No suspicious splenic lesion. Adrenals/Urinary Tract: No adrenal abnormality. No focal renal abnormality. No stones or hydronephrosis. Urinary bladder is unremarkable. Stomach/Bowel: Sigmoid diverticulosis. Inflammatory stranding around the mid to distal sigmoid  colon compatible with active diverticulitis. Stomach and small bowel decompressed, unremarkable. Appendix is normal. Vascular/Lymphatic: Aortic atherosclerosis. No enlarged abdominal or pelvic lymph nodes. Reproductive: Prior hysterectomy.  No adnexal masses. Other: No free fluid or free air. Musculoskeletal: No acute bony abnormality. Degenerative disc disease throughout the lumbar spine, most pronounced at L1-2. Findings are stable. IMPRESSION: Sigmoid diverticulosis with surrounding inflammatory change compatible with active diverticulitis. Fatty infiltration of the liver. Aortic atherosclerosis. Electronically Signed   By: Rolm Baptise M.D.   On: 10/27/2018 09:20    ____________________________________________   PROCEDURES  Procedure(s) performed:   Procedures  None ____________________________________________   INITIAL IMPRESSION / ASSESSMENT AND PLAN / ED COURSE  Pertinent labs & imaging results that were available during my care of the patient were reviewed by me and considered in my medical decision making (see chart for details).  She presents to the emergency department for evaluation of diffuse abdominal pain worse in the lower abdomen consistent with prior episodes of diverticulitis.  Afebrile and well-appearing here.  Patient did have some mild chest discomfort on the drive in today.  EKG reviewed with no acute ischemic changes.  Patient is otherwise low risk for ACS with HEART score of 3.  Chest x-ray and troponin given her mild chest discomfort.  Given her age and history at this is worse pain than she is had in the past with diverticulitis, plan for CT imaging of the abdomen and pelvis to rule out complicated diverticulitis.   Lab work reviewed along with EKG, chest x-ray.  No acute findings.  Patient CT scan shows uncomplicated diverticulitis.  She is feeling much better after morphine and Zofran.  No pain at this time.  No fevers.  Patient tolerating oral antibiotics.  She  reports feeling very sick with taking Cipro and Flagyl.  Will discharge the patient on Augmentin and advised that she discuss with PCP regarding GI referral PRN.   At this time, I do not feel there is any life-threatening condition present. I have reviewed and discussed all results (EKG, imaging, lab, urine as appropriate), exam findings with patient. I have reviewed nursing notes and appropriate previous records.  I feel the patient is safe to be discharged home without further emergent workup. Discussed usual and customary return precautions. Patient and family (if present) verbalize understanding and are comfortable with this plan.  Patient will follow-up with their primary care provider. If they do not have a primary care provider, information for follow-up has been provided to them. All questions have been answered.  ____________________________________________  FINAL CLINICAL IMPRESSION(S) / ED DIAGNOSES  Final diagnoses:  Diverticulitis of large intestine without perforation or abscess without bleeding  Generalized abdominal pain     MEDICATIONS GIVEN DURING THIS VISIT:  Medications  morphine 4 MG/ML injection 4 mg (4 mg Intravenous Given 10/27/18 0834)  ondansetron (ZOFRAN) injection 4  mg (4 mg Intravenous Given 10/27/18 0834)  iopamidol (ISOVUE-300) 61 % injection 100 mL (100 mLs Intravenous Contrast Given 10/27/18 0903)  amoxicillin-clavulanate (AUGMENTIN) 875-125 MG per tablet 1 tablet (1 tablet Oral Given 10/27/18 1105)     NEW OUTPATIENT MEDICATIONS STARTED DURING THIS VISIT:  New Prescriptions   AMOXICILLIN-CLAVULANATE (AUGMENTIN) 875-125 MG TABLET    Take 1 tablet by mouth every 12 (twelve) hours for 7 days.   ONDANSETRON (ZOFRAN ODT) 4 MG DISINTEGRATING TABLET    Take 1 tablet (4 mg total) by mouth every 8 (eight) hours as needed for nausea or vomiting.   TRAMADOL (ULTRAM) 50 MG TABLET    Take 1 tablet (50 mg total) by mouth every 6 (six) hours as needed for severe pain.     Note:  This document was prepared using Dragon voice recognition software and may include unintentional dictation errors.  Nanda Quinton, MD Emergency Medicine    Long, [facility], MD 10/27/18 [phone]

## 2018-11-01 ENCOUNTER — Ambulatory Visit: Payer: Medicare Other | Admitting: Family Medicine

## 2018-11-06 ENCOUNTER — Ambulatory Visit: Payer: Medicare Other | Admitting: Family Medicine

## 2018-11-13 ENCOUNTER — Ambulatory Visit (INDEPENDENT_AMBULATORY_CARE_PROVIDER_SITE_OTHER): Payer: Medicare Other | Admitting: Family Medicine

## 2018-11-13 ENCOUNTER — Other Ambulatory Visit: Payer: Self-pay

## 2018-11-13 ENCOUNTER — Encounter: Payer: Self-pay | Admitting: Family Medicine

## 2018-11-13 VITALS — BP 130/76 | HR 62 | Temp 98.4°F | Resp 14 | Ht 63.0 in | Wt 155.0 lb

## 2018-11-13 DIAGNOSIS — K5792 Diverticulitis of intestine, part unspecified, without perforation or abscess without bleeding: Secondary | ICD-10-CM

## 2018-11-13 LAB — CBC WITH DIFFERENTIAL/PLATELET
BASOS PCT: 1.3 % (ref 0.0–3.0)
Basophils Absolute: 0.1 10*3/uL (ref 0.0–0.1)
EOS PCT: 4.4 % (ref 0.0–5.0)
Eosinophils Absolute: 0.3 10*3/uL (ref 0.0–0.7)
HEMATOCRIT: 40.2 % (ref 36.0–46.0)
Hemoglobin: 13.4 g/dL (ref 12.0–15.0)
LYMPHS ABS: 2.7 10*3/uL (ref 0.7–4.0)
LYMPHS PCT: 38.6 % (ref 12.0–46.0)
MCHC: 33.2 g/dL (ref 30.0–36.0)
MCV: 91.4 fl (ref 78.0–100.0)
MONOS PCT: 8.3 % (ref 3.0–12.0)
Monocytes Absolute: 0.6 10*3/uL (ref 0.1–1.0)
NEUTROS ABS: 3.3 10*3/uL (ref 1.4–7.7)
Neutrophils Relative %: 47.4 % (ref 43.0–77.0)
PLATELETS: 290 10*3/uL (ref 150.0–400.0)
RBC: 4.4 Mil/uL (ref 3.87–5.11)
RDW: 12.7 % (ref 11.5–15.5)
WBC: 7 10*3/uL (ref 4.0–10.5)

## 2018-11-13 MED ORDER — BACLOFEN 5 MG PO TABS: 5.0000 mg | ORAL_TABLET | Freq: Two times a day (BID) | ORAL | 0 refills | Status: DC | PRN

## 2018-11-13 NOTE — Patient Instructions (Signed)
We will call you with the lab results once available.   Continue to hydrate.    Diverticulitis Diverticulitis is when small pockets in your large intestine (colon) get infected or swollen. This causes stomach pain and watery poop (diarrhea). These pouches are called diverticula. They form in people who have a condition called diverticulosis. Follow these instructions at home: Medicines  Take over-the-counter and prescription medicines only as told by your doctor. These include: ? Antibiotics. ? Pain medicines. ? Fiber pills. ? Probiotics. ? Stool softeners.  Do not drive or use heavy machinery while taking prescription pain medicine.  If you were prescribed an antibiotic, take it as told. Do not stop taking it even if you feel better. General instructions  Follow a diet as told by your doctor.  When you feel better, your doctor may tell you to change your diet. You may need to eat a lot of fiber. Fiber makes it easier to poop (have bowel movements). Healthy foods with fiber include: ? Berries. ? Beans. ? Lentils. ? Green vegetables.  Exercise 3 or more times a week. Aim for 30 minutes each time. Exercise enough to sweat and make your heart beat faster.  Keep all follow-up visits as told. This is important. You may need to have an exam of the large intestine. This is called a colonoscopy. Contact a doctor if:  Your pain does not get better.  You have a hard time eating or drinking.  You are not pooping like normal. Get help right away if:  Your pain gets worse.  Your problems do not get better.  Your problems get worse very fast.  You have a fever.  You throw up (vomit) more than one time.  You have poop that is: ? Bloody. ? Black. ? Tarry. Summary  Diverticulitis is when small pockets in your large intestine (colon) get infected or swollen.  Take medicines only as told by your doctor.  Follow a diet as told by your doctor. This information is not  intended to replace advice given to you by your health care provider. Make sure you discuss any questions you have with your health care provider. Document Released: 05/10/2008 Document Revised: 12/09/2016 Document Reviewed: 12/09/2016 Elsevier Interactive Patient Education  2017 [employer].

## 2018-11-13 NOTE — Progress Notes (Signed)
Victoria Bryant , 28-Feb-1946, 72 y.o., female MRN: [id number] Patient Care Team    Relationship Specialty Notifications Start End  [name], DO PCP - General Family Medicine  06/18/16   [name], MD Consulting Physician Obstetrics and Gynecology  10/06/16   [name], MD (Inactive) Consulting Physician Gastroenterology  10/08/16   Ladene Artist, MD Consulting Physician Gastroenterology  10/24/18   Lyndal Pulley, DO Consulting Physician Family Medicine  10/24/18   Allyn Kenner, MD  Dermatology  10/24/18     Chief Complaint  Patient presents with  . Hospitalization Follow-up    Diverticulitis Flare in November. Pt. States that she is just tired, but flare up seems better.      Subjective: Pt presents for an OV for follow up after ED visit for diverticular flare. She tolerated the Augmentin. Her appetite is still decreased but she is tolerating PO and hydrating. She reports her stools are daily and normal. She denies fever, chills, nausea, vomit or diarrhea over the last week- she did have fevers for a few days after abx start. She is still a little fatigue, but better. Her husband is in the hospital for leg amputation which is also causing her fatigue.   Depression screen [facility] 2/9 10/24/2018 03/15/2018 11/21/2017 10/21/2017 07/28/2017  Decreased Interest 0 0 0 1 0  Down, Depressed, Hopeless 0 0 0 1 0  PHQ - 2 Score 0 0 0 2 0  Altered sleeping 0 - 1 0 0  Tired, decreased energy 0 - 0 2 3  Change in appetite 0 - 0 0 0  Feeling bad or failure about yourself  0 - 0 0 0  Trouble concentrating 0 - 0 1 0  Moving slowly or fidgety/restless 0 - 0 0 1  Suicidal thoughts 0 - 0 0 0  PHQ-9 Score 0 - 1 5 4   Difficult doing work/chores Not difficult at all - - Not difficult at all -    Allergies  Allergen Reactions  . Naproxen Diarrhea  . Paroxetine     REACTION: unspecified  . Clarithromycin Nausea Only  . Doxycycline Hyclate Nausea And Vomiting  . Erythromycin  Ethylsuccinate Nausea Only  . Lexapro [Escitalopram Oxalate] Other (See Comments)    Paresthesia    Social History   Tobacco Use  . Smoking status: Former Smoker    Last attempt to quit: 12/06/1973    Years since quitting: 44.9  . Smokeless tobacco: Never Used  Substance Use Topics  . Alcohol use: No   Past Medical History:  Diagnosis Date  . Arthritis   . COLONIC POLYPS, HX OF 04/21/2007  . DEPRESSION 05/26/2009  . Diverticulitis   . GERD 04/08/2008  . Interstitial cystitis    ? per pt: was told by urologist she has this  . 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 MENOPAUSAL SYNDROME 04/21/2007  . NECK PAIN, CHRONIC 04/21/2007   Past Surgical History:  Procedure Laterality Date  . COLONOSCOPY  x 2   First time polyps, second time no polyps: recall 10 yrs (next is approx 2024 per pt report)  . INCISION AND DRAINAGE ABSCESS ANAL    . POLYPECTOMY    . TONSILLECTOMY    . UPPER GASTROINTESTINAL ENDOSCOPY    . VAGINAL HYSTERECTOMY     Nonmalignant reason.  Ovaries still in.   Family History  Problem Relation Age of Onset  . Bladder Cancer Mother   . Lung cancer Mother   . Breast  cancer Paternal Aunt   . ALS Daughter   . Fibromyalgia Sister   . Heart disease Brother   . Alcohol abuse Brother   . Colon cancer Neg Hx   . Colon polyps Neg Hx   . Esophageal cancer Neg Hx   . Rectal cancer Neg Hx   . Stomach cancer Neg Hx    Allergies as of 11/13/2018      Reactions   Naproxen Diarrhea   Paroxetine    REACTION: unspecified   Clarithromycin Nausea Only   Doxycycline Hyclate Nausea And Vomiting   Erythromycin Ethylsuccinate Nausea Only   Lexapro [escitalopram Oxalate] Other (See Comments)   Paresthesia      Medication List        Accurate as of 11/13/18  9:52 AM. Always use your most recent med list.          ALPRAZolam 0.5 MG tablet Commonly known as:  XANAX Take 1 tablet (0.5 mg total) by mouth daily.   Baclofen 5 MG Tabs Take 5 mg by mouth 2 (two) times  daily as needed.   fluticasone 50 MCG/ACT nasal spray Commonly known as:  FLONASE Place 1 spray into both nostrils daily.   HAIR/SKIN/NAILS PO Take by mouth.   hyoscyamine 0.125 MG SL tablet Commonly known as:  LEVSIN SL Place 1 tablet (0.125 mg total) under the tongue every 6 (six) hours as needed.   JUICE PLUS FIBRE PO Take by mouth.   meloxicam 15 MG tablet Commonly known as:  MOBIC Take 1 tablet (15 mg total) by mouth daily.   ondansetron 4 MG disintegrating tablet Commonly known as:  ZOFRAN-ODT Take 1 tablet (4 mg total) by mouth every 8 (eight) hours as needed for nausea or vomiting.   pantoprazole 40 MG tablet Commonly known as:  PROTONIX Take 1 tablet (40 mg total) by mouth daily.   RA PROBIOTIC GUMMIES PO Take by mouth.   TART CHERRY ADVANCED PO Take by mouth.   tolterodine 4 MG 24 hr capsule Commonly known as:  DETROL LA Take 4 mg by mouth daily as needed.   TURMERIC PO Take by mouth.   VITAMIN B 12 PO Take by mouth.   vitamin C 1000 MG tablet Take 1,000 mg by mouth daily.   ZINC PO Take by mouth.       All past medical history, surgical history, allergies, family history, immunizations andmedications were updated in the EMR today and reviewed under the history and medication portions of their EMR.     ROS: Negative, with the exception of above mentioned in HPI   Objective:  BP 130/76   Pulse 62   Temp 98.4 F (36.9 C) (Oral)   Resp 14   Ht 5\' 3"  (1.6 m)   Wt 155 lb (70.3 kg)   SpO2 97%   BMI 27.46 kg/m  Body mass index is 27.46 kg/m. Gen: Afebrile. No acute distress. Nontoxic in appearance, well developed, well nourished. Pleasant female.  HENT: AT. [place]. MMM, no oral lesions.  Eyes:Pupils Equal Round Reactive to light, Extraocular movements intact,  Conjunctiva without redness, discharge or icterus. Neck/lymp/endocrine: Supple,no lymphadenopathy CV: RRR no murmur, no edema Chest: CTAB, no wheeze or crackles.  Abd: Soft. flat.  NTND. BS present. no Masses palpated. No rebound or guarding.  Skin: no rashes, purpura or petechiae.  Neuro:  Normal gait. PERLA. EOMi. Alert. Oriented x3 Cranial nerves II through XII intact. Muscle strength 5/5  extremity. DTRs equal bilaterally. Psych: Normal affect, dress  and demeanor. Normal speech. Normal thought content and judgment.  No exam data present No results found. No results found for this or any previous visit (from the past 24 hour(s)).  Assessment/Plan: Victoria Bryant is a 72 y.o. female present for OV for  Diverticulitis - improving from recent diverticular flare. Reviewed ED notes, labs and CT scan.  - CBC w/Diff - Hydrate.  F/U PRN   Reviewed expectations re: course of current medical issues.  Discussed self-management of symptoms.  Outlined signs and symptoms indicating need for more acute intervention.  Patient verbalized understanding and all questions were answered.  Patient received an After-Visit Summary.   > 15 minutes spent with patient, >50% of time spent face to face counseling     Orders Placed This Encounter  Procedures  . CBC w/Diff     Note is dictated utilizing voice recognition software. Although note has been proof read prior to signing, occasional typographical errors still can be missed. If any questions arise, please do not hesitate to call for verification.   electronically signed by:  Howard Pouch, DO  [place]

## 2018-11-14 ENCOUNTER — Other Ambulatory Visit (HOSPITAL_BASED_OUTPATIENT_CLINIC_OR_DEPARTMENT_OTHER): Payer: Medicare Other

## 2018-11-15 ENCOUNTER — Ambulatory Visit (HOSPITAL_BASED_OUTPATIENT_CLINIC_OR_DEPARTMENT_OTHER)
Admission: RE | Admit: 2018-11-15 | Discharge: 2018-11-15 | Disposition: A | Discharge status: Home / Self Care | Payer: Medicare Other | Source: Ambulatory Visit | Attending: Family Medicine | Admitting: Family Medicine

## 2018-11-15 DIAGNOSIS — Z780 Asymptomatic menopausal state: Secondary | ICD-10-CM | POA: Diagnosis not present

## 2018-11-15 DIAGNOSIS — Z13820 Encounter for screening for osteoporosis: Secondary | ICD-10-CM | POA: Diagnosis not present

## 2018-11-15 DIAGNOSIS — E2839 Other primary ovarian failure: Secondary | ICD-10-CM | POA: Diagnosis not present

## 2018-11-22 ENCOUNTER — Ambulatory Visit: Payer: Medicare Other | Admitting: Family Medicine

## 2018-12-12 ENCOUNTER — Other Ambulatory Visit: Payer: Self-pay | Admitting: Family Medicine

## 2018-12-12 MED ORDER — PANTOPRAZOLE SODIUM 40 MG PO TBEC: 40.0000 mg | DELAYED_RELEASE_TABLET | Freq: Every day | ORAL | 3 refills | Status: DC

## 2018-12-12 NOTE — Telephone Encounter (Signed)
Copied from [place] [id number]. Topic: Quick Communication - Rx Refill/Question >> Dec 12, 2018 11:28 AM Bea Graff, NT wrote: Medication: pantoprazole (PROTONIX) 40 MG tablet  Has the patient contacted their pharmacy? Yes.   (Agent: If no, request that the patient contact the pharmacy for the refill.) (Agent: If yes, when and what did the pharmacy advise?)  Preferred Pharmacy (with phone number or street name): [facility] - [place], [place] - [address] (Phone) [phone] (Fax)    Agent: Please be advised that RX refills may take up to 3 business days. We ask that you follow-up with your pharmacy.

## 2018-12-15 NOTE — Telephone Encounter (Signed)
Done

## 2018-12-18 ENCOUNTER — Encounter: Payer: Self-pay | Admitting: Family Medicine

## 2018-12-18 ENCOUNTER — Ambulatory Visit (INDEPENDENT_AMBULATORY_CARE_PROVIDER_SITE_OTHER): Payer: Medicare Other | Admitting: Family Medicine

## 2018-12-18 ENCOUNTER — Ambulatory Visit (HOSPITAL_BASED_OUTPATIENT_CLINIC_OR_DEPARTMENT_OTHER)
Admission: RE | Admit: 2018-12-18 | Discharge: 2018-12-18 | Disposition: A | Discharge status: Home / Self Care | Payer: Medicare Other | Source: Ambulatory Visit | Attending: Family Medicine | Admitting: Family Medicine

## 2018-12-18 VITALS — BP 136/74 | HR 71 | Temp 98.1°F | Resp 16 | Ht 63.0 in | Wt 157.0 lb

## 2018-12-18 DIAGNOSIS — J40 Bronchitis, not specified as acute or chronic: Secondary | ICD-10-CM | POA: Diagnosis not present

## 2018-12-18 DIAGNOSIS — R062 Wheezing: Secondary | ICD-10-CM

## 2018-12-18 DIAGNOSIS — N3010 Interstitial cystitis (chronic) without hematuria: Secondary | ICD-10-CM | POA: Diagnosis not present

## 2018-12-18 DIAGNOSIS — R300 Dysuria: Secondary | ICD-10-CM

## 2018-12-18 DIAGNOSIS — R05 Cough: Secondary | ICD-10-CM | POA: Diagnosis not present

## 2018-12-18 DIAGNOSIS — R0989 Other specified symptoms and signs involving the circulatory and respiratory systems: Secondary | ICD-10-CM | POA: Diagnosis not present

## 2018-12-18 LAB — POC URINALSYSI DIPSTICK (AUTOMATED)
Bilirubin, UA: NEGATIVE
Blood, UA: NEGATIVE
GLUCOSE UA: NEGATIVE
Ketones, UA: NEGATIVE
LEUKOCYTES UA: NEGATIVE
NITRITE UA: NEGATIVE
PROTEIN UA: NEGATIVE
UROBILINOGEN UA: 0.2 U/dL
pH, UA: 6.5 (ref 5.0–8.0)

## 2018-12-18 MED ORDER — IPRATROPIUM-ALBUTEROL 0.5-2.5 (3) MG/3ML IN SOLN
3.0000 mL | Freq: Once | RESPIRATORY_TRACT | Status: AC
  Administered 2018-12-18: 3 mL via RESPIRATORY_TRACT

## 2018-12-18 MED ORDER — CEFDINIR 300 MG PO CAPS: 300.0000 mg | ORAL_CAPSULE | Freq: Two times a day (BID) | ORAL | 0 refills | Status: DC

## 2018-12-18 MED ORDER — HYOSCYAMINE SULFATE 0.125 MG SL SUBL: 0.1250 mg | SUBLINGUAL_TABLET | Freq: Three times a day (TID) | SUBLINGUAL | 3 refills | Status: DC | PRN

## 2018-12-18 MED ORDER — TOLTERODINE TARTRATE ER 4 MG PO CP24: 4.0000 mg | ORAL_CAPSULE | Freq: Every day | ORAL | 3 refills | Status: DC | PRN

## 2018-12-18 MED ORDER — HYDROCODONE-HOMATROPINE 5-1.5 MG/5ML PO SYRP: 5.0000 mL | ORAL_SOLUTION | Freq: Three times a day (TID) | ORAL | 0 refills | Status: DC | PRN

## 2018-12-18 NOTE — Patient Instructions (Signed)
Rest, hydrate.  Continue  mucinex (DM if cough).  Hycodan cough syrup prescribed.  omnicef antibiotic  prescribed, take until completed.  Please have CXR completed at [facility] today--> concern for pneumonia with ling findings.  If cough present it can last up to 6-8 weeks.  F/U 2 weeks of not improved.    Acute Bronchitis, Adult Acute bronchitis is when air tubes (bronchi) in the lungs suddenly get swollen. The condition can make it hard to breathe. It can also cause these symptoms:  A cough.  Coughing up clear, yellow, or green mucus.  Wheezing.  Chest congestion.  Shortness of breath.  A fever.  Body aches.  Chills.  A sore throat. Follow these instructions at home:  Medicines  Take over-the-counter and prescription medicines only as told by your doctor.  If you were prescribed an antibiotic medicine, take it as told by your doctor. Do not stop taking the antibiotic even if you start to feel better. General instructions  Rest.  Drink enough fluids to keep your pee (urine) pale yellow.  Avoid smoking and secondhand smoke. If you smoke and you need help quitting, ask your doctor. Quitting will help your lungs heal faster.  Use an inhaler, cool mist vaporizer, or humidifier as told by your doctor.  Keep all follow-up visits as told by your doctor. This is important. How is this prevented? To lower your risk of getting this condition again:  Wash your hands often with soap and water. If you cannot use soap and water, use hand sanitizer.  Avoid contact with people who have cold symptoms.  Try not to touch your hands to your mouth, nose, or eyes.  Make sure to get the flu shot every year. Contact a doctor if:  Your symptoms do not get better in 2 weeks. Get help right away if:  You cough up blood.  You have chest pain.  You have very bad shortness of breath.  You become dehydrated.  You faint (pass out) or keep feeling like you are going to pass  out.  You keep throwing up (vomiting).  You have a very bad headache.  Your fever or chills gets worse. This information is not intended to replace advice given to you by your health care provider. Make sure you discuss any questions you have with your health care provider. Document Released: 05/10/2008 Document Revised: 07/06/2017 Document Reviewed: 05/12/2016 Elsevier Interactive Patient Education  2019 [employer].

## 2018-12-18 NOTE — Progress Notes (Signed)
Victoria Bryant , [birth year], 73 y.o., female MRN: [id number] Patient Care Team    Relationship Specialty Notifications Start End  [name], DO PCP - General Family Medicine  06/18/16   [name], MD Consulting Physician Obstetrics and Gynecology  10/06/16   [name], MD (Inactive) Consulting Physician Gastroenterology  10/08/16   Ladene Artist, MD Consulting Physician Gastroenterology  10/24/18   Lyndal Pulley, DO Consulting Physician Family Medicine  10/24/18   Allyn Kenner, MD  Dermatology  10/24/18     Chief Complaint  Patient presents with  . Cough    x5 days, productive, unsure of color of drainage  . Dysuria    x1 day     Subjective: Pt presents for an OV with complaints of cough of > 5 days duration worsening. Had been ill over the new year.  Associated symptoms include Sore throat, productive cough, wheeze, aches, fever, burning with urination started yesterday.  Had flu shot 09/08/2018. Pt has tried mucinex to ease their symptoms.   She also needs her levsin and detrol LA refilled and sent to [place]. Her husband passed away the day after xmas. She reports she is managing ok. She knows he is no longer suffering.   Depression screen [facility] 2/9 10/24/2018 03/15/2018 11/21/2017 10/21/2017 07/28/2017  Decreased Interest 0 0 0 1 0  Down, Depressed, Hopeless 0 0 0 1 0  PHQ - 2 Score 0 0 0 2 0  Altered sleeping 0 - 1 0 0  Tired, decreased energy 0 - 0 2 3  Change in appetite 0 - 0 0 0  Feeling bad or failure about yourself  0 - 0 0 0  Trouble concentrating 0 - 0 1 0  Moving slowly or fidgety/restless 0 - 0 0 1  Suicidal thoughts 0 - 0 0 0  PHQ-9 Score 0 - 1 5 4   Difficult doing work/chores Not difficult at all - - Not difficult at all -    Allergies  Allergen Reactions  . Naproxen Diarrhea  . Paroxetine     REACTION: unspecified  . Clarithromycin Nausea Only  . Doxycycline Hyclate Nausea And Vomiting  . Erythromycin Ethylsuccinate Nausea Only  .  Lexapro [Escitalopram Oxalate] Other (See Comments)    Paresthesia    Social History   Tobacco Use  . Smoking status: Former Smoker    Last attempt to quit: 12/06/1973    Years since quitting: 45.0  . Smokeless tobacco: Never Used  Substance Use Topics  . Alcohol use: No   Past Medical History:  Diagnosis Date  . Arthritis   . COLONIC POLYPS, HX OF 04/21/2007  . DEPRESSION 05/26/2009  . Diverticulitis   . GERD 04/08/2008  . Interstitial cystitis    ? per pt: was told by urologist she has this  . 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 MENOPAUSAL SYNDROME 04/21/2007  . NECK PAIN, CHRONIC 04/21/2007   Past Surgical History:  Procedure Laterality Date  . COLONOSCOPY  x 2   First time polyps, second time no polyps: recall 10 yrs (next is approx 2024 per pt report)  . INCISION AND DRAINAGE ABSCESS ANAL    . POLYPECTOMY    . TONSILLECTOMY    . UPPER GASTROINTESTINAL ENDOSCOPY    . VAGINAL HYSTERECTOMY     Nonmalignant reason.  Ovaries still in.   Family History  Problem Relation Age of Onset  . Bladder Cancer Mother   . Lung cancer Mother   .  Breast cancer Paternal Aunt   . ALS Daughter   . Fibromyalgia Sister   . Heart disease Brother   . Alcohol abuse Brother   . Colon cancer Neg Hx   . Colon polyps Neg Hx   . Esophageal cancer Neg Hx   . Rectal cancer Neg Hx   . Stomach cancer Neg Hx    Allergies as of 12/18/2018      Reactions   Naproxen Diarrhea   Paroxetine    REACTION: unspecified   Clarithromycin Nausea Only   Doxycycline Hyclate Nausea And Vomiting   Erythromycin Ethylsuccinate Nausea Only   Lexapro [escitalopram Oxalate] Other (See Comments)   Paresthesia      Medication List       Accurate as of December 18, 2018 11:17 AM. Always use your most recent med list.        ALPRAZolam 0.5 MG tablet Commonly known as:  XANAX Take 1 tablet (0.5 mg total) by mouth daily.   Baclofen 5 MG Tabs Take 5 mg by mouth 2 (two) times daily as needed.     fluticasone 50 MCG/ACT nasal spray Commonly known as:  FLONASE Place 1 spray into both nostrils daily.   HAIR/SKIN/NAILS PO Take by mouth.   hyoscyamine 0.125 MG SL tablet Commonly known as:  LEVSIN/SL Place 1 tablet (0.125 mg total) under the tongue every 6 (six) hours as needed.   JUICE PLUS FIBRE PO Take by mouth.   meloxicam 15 MG tablet Commonly known as:  MOBIC Take 1 tablet (15 mg total) by mouth daily.   ondansetron 4 MG disintegrating tablet Commonly known as:  ZOFRAN ODT Take 1 tablet (4 mg total) by mouth every 8 (eight) hours as needed for nausea or vomiting.   pantoprazole 40 MG tablet Commonly known as:  PROTONIX Take 1 tablet (40 mg total) by mouth daily.   RA PROBIOTIC GUMMIES PO Take by mouth.   TART CHERRY ADVANCED PO Take by mouth.   tolterodine 4 MG 24 hr capsule Commonly known as:  DETROL LA Take 4 mg by mouth daily as needed.   TURMERIC PO Take by mouth.   VITAMIN B 12 PO Take by mouth.   vitamin C 1000 MG tablet Take 1,000 mg by mouth daily.   ZINC PO Take by mouth.       All past medical history, surgical history, allergies, family history, immunizations andmedications were updated in the EMR today and reviewed under the history and medication portions of their EMR.     ROS: Negative, with the exception of above mentioned in HPI   Objective:  BP 136/74 (BP Location: Left Arm, Patient Position: Sitting, Cuff Size: Normal)   Pulse 71   Temp 98.1 F (36.7 C) (Oral)   Resp 16   Ht 5\' 3"  (1.6 m)   Wt 157 lb (71.2 kg)   SpO2 98%   BMI 27.81 kg/m  Body mass index is 27.81 kg/m. Gen: Afebrile. No acute distress. Nontoxic in appearance, well developed, well nourished.  HENT: AT. [place]. Bilateral TM visualized WNL. MMM, no oral lesions. Bilateral nares with drainage, no erythema. Throat without erythema or exudates. Cough present Eyes:Pupils Equal Round Reactive to light, Extraocular movements intact,  Conjunctiva without redness,  discharge or icterus. Neck/lymp/endocrine: Supple,no lymphadenopathy CV: RRR , no murmur Chest: diffuse wheezing.  Abd: Soft.. NTND. BS present. no Masses palpated. No rebound or guarding. Skin: no rashes, purpura or petechiae.  Neuro:  Normal gait. PERLA. EOMi. Alert. Oriented x3  No exam data present No results found. Results for orders placed or performed in visit on 12/18/18 (from the past 24 hour(s))  POCT Urinalysis Dipstick (Automated)     Status: Abnormal   Collection Time: 12/18/18 11:10 AM  Result Value Ref Range   Color, UA light yellow    Clarity, UA clear    Glucose, UA Negative Negative   Bilirubin, UA negative    Ketones, UA negative    Spec Grav, UA <=1.005 (A) 1.010 - 1.025   Blood, UA negative    pH, UA 6.5 5.0 - 8.0   Protein, UA Negative Negative   Urobilinogen, UA 0.2 0.2 or 1.0 E.U./dL   Nitrite, UA negative    Leukocytes, UA Negative Negative    Assessment/Plan: DANAI GOTTO is a 73 y.o. female present for OV for  Dysuria/IC Normal urine today Refills detrol LA Refills levsin. Worsening since husband passed.  - POCT Urinalysis Dipstick (Automated)  Bronchitis/Wheezing/Lung crackles Concrn for PNA with lung findings. Duoneb treatment in the office improved lung fields, except for left crackles remain.  - DG Chest 2 View; Future - cefdinir (OMNICEF) 300 MG capsule; Take 1 capsule (300 mg total) by mouth 2 (two) times daily.  Dispense: 20 capsule; Refill: 0 Rest, hydrate.  Continue  mucinex (DM if cough).  Hycodan cough syrup prescribed.  omnicef antibiotic  prescribed, take until completed.  Please have [facility] completed at [facility] today If cough present it can last up to 6-8 weeks.  F/U 2 weeks of not improved.    Reviewed expectations re: course of current medical issues.  Discussed self-management of symptoms.  Outlined signs and symptoms indicating need for more acute intervention.  Patient verbalized understanding and all  questions were answered.  Patient received an After-Visit Summary.    Orders Placed This Encounter  Procedures  . POCT Urinalysis Dipstick (Automated)   > 25 minutes spent with patient, >50% of time spent face to face counseling     Note is dictated utilizing voice recognition software. Although note has been proof read prior to signing, occasional typographical errors still can be missed. If any questions arise, please do not hesitate to call for verification.   electronically signed by:  Howard Pouch, DO  [place]

## 2018-12-21 ENCOUNTER — Ambulatory Visit: Payer: Self-pay | Admitting: *Deleted

## 2018-12-21 ENCOUNTER — Ambulatory Visit: Payer: Medicare Other | Admitting: Family Medicine

## 2018-12-21 NOTE — Telephone Encounter (Signed)
Pt notified that a steroid injection is not indicated for pneumonia. Advised pt that she needs to give her body some time for the antibotic to start working and that she needs to relax and rest. Advised to not overdo her activity level. Pt advised if not better to call back early next wk and we would get her an appt. Pt voiced understanding.

## 2018-12-21 NOTE — Telephone Encounter (Signed)
Message from Conception Chancy, NT sent at 12/21/2018 8:49 AM EST   Patient is calling and was seen on 12/18/18 and was diagnosed with pneumonia and states she has not had any improvement and would like a call back from a nurse.    Returned call to patient regarding not seeing improvement since last since at the office on 12/18/18. Advised pat that it should take more than 3 days to start seeing a difference. She was started on an antibiotic and given a cough medication.  She states that she does not have a fever. She is drinking fluids, has humidifier and taking her medications. She would like to know if her pcp could give her an injection to "jump start" her treatment like she did a year ago. She would like a call back today. Will route to flow at [facility].

## 2018-12-21 NOTE — Telephone Encounter (Signed)
  Reason for Disposition . Nursing judgment  Protocols used: NO GUIDELINE OR REFERENCE AVAILABLE-A-AH

## 2018-12-26 DIAGNOSIS — Z08 Encounter for follow-up examination after completed treatment for malignant neoplasm: Secondary | ICD-10-CM | POA: Diagnosis not present

## 2018-12-26 DIAGNOSIS — Z85820 Personal history of malignant melanoma of skin: Secondary | ICD-10-CM | POA: Diagnosis not present

## 2018-12-26 DIAGNOSIS — Z1283 Encounter for screening for malignant neoplasm of skin: Secondary | ICD-10-CM | POA: Diagnosis not present

## 2018-12-26 DIAGNOSIS — B070 Plantar wart: Secondary | ICD-10-CM | POA: Diagnosis not present

## 2018-12-28 DIAGNOSIS — Z1231 Encounter for screening mammogram for malignant neoplasm of breast: Secondary | ICD-10-CM | POA: Diagnosis not present

## 2018-12-28 LAB — HM MAMMOGRAPHY

## 2019-01-04 ENCOUNTER — Encounter: Payer: Self-pay | Admitting: Family Medicine

## 2019-01-08 ENCOUNTER — Ambulatory Visit (HOSPITAL_BASED_OUTPATIENT_CLINIC_OR_DEPARTMENT_OTHER)
Admission: RE | Admit: 2019-01-08 | Discharge: 2019-01-08 | Disposition: A | Discharge status: Home / Self Care | Payer: Medicare Other | Source: Ambulatory Visit | Attending: Family Medicine | Admitting: Family Medicine

## 2019-01-08 ENCOUNTER — Encounter: Payer: Self-pay | Admitting: Family Medicine

## 2019-01-08 ENCOUNTER — Ambulatory Visit (INDEPENDENT_AMBULATORY_CARE_PROVIDER_SITE_OTHER): Payer: Medicare Other | Admitting: Family Medicine

## 2019-01-08 VITALS — BP 131/73 | HR 74 | Temp 98.1°F | Resp 16 | Wt 154.0 lb

## 2019-01-08 DIAGNOSIS — J189 Pneumonia, unspecified organism: Secondary | ICD-10-CM | POA: Diagnosis not present

## 2019-01-08 DIAGNOSIS — J181 Lobar pneumonia, unspecified organism: Secondary | ICD-10-CM | POA: Diagnosis not present

## 2019-01-08 NOTE — Progress Notes (Signed)
Victoria Bryant , August 13, 1946, 73 y.o., female MRN: [id number] Patient Care Team    Relationship Specialty Notifications Start End  [name], DO PCP - General Family Medicine  06/18/16   [name], MD Consulting Physician Obstetrics and Gynecology  10/06/16   [name], MD (Inactive) Consulting Physician Gastroenterology  10/08/16   Ladene Artist, MD Consulting Physician Gastroenterology  10/24/18   Lyndal Pulley, DO Consulting Physician Family Medicine  10/24/18   Allyn Kenner, MD  Dermatology  10/24/18     Chief Complaint  Patient presents with  . Follow-up    Pneuonia, still feels some congestion/crackling in her upper chest. Pt states that she is know having headaches. States that she is still coughing at night.     Subjective:  Victoria Bryant is a 73 y.o. female present for pneumonia followup.  She reports she is feeling much better, but her energy is not fully back to normal. She has a mild cough when first laying down, and a headache I the morning some nights. She denies any fever, chills, nausea. She is eating and drinking her normal. She has  Had two episodes of Pneumonia over the last year, in different locations.   Prior note:  Pt presents for an OV with complaints of cough of > 5 days duration worsening. Had been ill over the new year.  Associated symptoms include Sore throat, productive cough, wheeze, aches, fever, burning with urination started yesterday.  Had flu shot 09/08/2018. Pt has tried mucinex to ease their symptoms.   She also needs her levsin and detrol LA refilled and sent to [place]. Her husband passed away the day after xmas. She reports she is managing ok. She knows he is no longer suffering.   XRAY 12/18/2018: FINDINGS: Subtle infiltrate in the left perihilar region. The heart, hila, mediastinum, lungs, and pleura are otherwise unremarkable. IMPRESSION: There is a subtle infiltrate left perihilar region suspicious for pneumonia given  history. Recommend follow-up x-ray imaging to ensure resolution.  Depression screen [facility] 2/9 10/24/2018 03/15/2018 11/21/2017 10/21/2017 07/28/2017  Decreased Interest 0 0 0 1 0  Down, Depressed, Hopeless 0 0 0 1 0  PHQ - 2 Score 0 0 0 2 0  Altered sleeping 0 - 1 0 0  Tired, decreased energy 0 - 0 2 3  Change in appetite 0 - 0 0 0  Feeling bad or failure about yourself  0 - 0 0 0  Trouble concentrating 0 - 0 1 0  Moving slowly or fidgety/restless 0 - 0 0 1  Suicidal thoughts 0 - 0 0 0  PHQ-9 Score 0 - 1 5 4   Difficult doing work/chores Not difficult at all - - Not difficult at all -    Allergies  Allergen Reactions  . Naproxen Diarrhea  . Paroxetine     REACTION: unspecified  . Clarithromycin Nausea Only  . Doxycycline Hyclate Nausea And Vomiting  . Erythromycin Ethylsuccinate Nausea Only  . Lexapro [Escitalopram Oxalate] Other (See Comments)    Paresthesia    Social History   Tobacco Use  . Smoking status: Former Smoker    Last attempt to quit: 12/06/1973    Years since quitting: 45.1  . Smokeless tobacco: Never Used  Substance Use Topics  . Alcohol use: No   Past Medical History:  Diagnosis Date  . Arthritis   . COLONIC POLYPS, HX OF 04/21/2007  . DEPRESSION 05/26/2009  . Diverticulitis   . GERD 04/08/2008  .  Interstitial cystitis    ? per pt: was told by urologist she has this  . 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 MENOPAUSAL SYNDROME 04/21/2007  . NECK PAIN, CHRONIC 04/21/2007   Past Surgical History:  Procedure Laterality Date  . COLONOSCOPY  x 2   First time polyps, second time no polyps: recall 10 yrs (next is approx 2024 per pt report)  . INCISION AND DRAINAGE ABSCESS ANAL    . POLYPECTOMY    . TONSILLECTOMY    . UPPER GASTROINTESTINAL ENDOSCOPY    . VAGINAL HYSTERECTOMY     Nonmalignant reason.  Ovaries still in.   Family History  Problem Relation Age of Onset  . Bladder Cancer Mother   . Lung cancer Mother   . Breast cancer Paternal Aunt     . ALS Daughter   . Fibromyalgia Sister   . Heart disease Brother   . Alcohol abuse Brother   . Colon cancer Neg Hx   . Colon polyps Neg Hx   . Esophageal cancer Neg Hx   . Rectal cancer Neg Hx   . Stomach cancer Neg Hx    Allergies as of 01/08/2019      Reactions   Naproxen Diarrhea   Paroxetine    REACTION: unspecified   Clarithromycin Nausea Only   Doxycycline Hyclate Nausea And Vomiting   Erythromycin Ethylsuccinate Nausea Only   Lexapro [escitalopram Oxalate] Other (See Comments)   Paresthesia      Medication List       Accurate as of January 08, 2019  8:49 AM. Always use your most recent med list.        Baclofen 5 MG Tabs Take 5 mg by mouth 2 (two) times daily as needed.   fluticasone 50 MCG/ACT nasal spray Commonly known as:  FLONASE Place 1 spray into both nostrils daily.   HAIR/SKIN/NAILS PO Take by mouth.   hyoscyamine 0.125 MG SL tablet Commonly known as:  LEVSIN SL Place 1 tablet (0.125 mg total) under the tongue every 8 (eight) hours as needed.   JUICE PLUS FIBRE PO Take by mouth.   meloxicam 15 MG tablet Commonly known as:  MOBIC Take 1 tablet (15 mg total) by mouth daily.   pantoprazole 40 MG tablet Commonly known as:  PROTONIX Take 1 tablet (40 mg total) by mouth daily.   RA PROBIOTIC GUMMIES PO Take by mouth.   tolterodine 4 MG 24 hr capsule Commonly known as:  DETROL LA Take 1 capsule (4 mg total) by mouth daily as needed.   TURMERIC PO Take by mouth.   VITAMIN B 12 PO Take by mouth.   vitamin C 1000 MG tablet Take 1,000 mg by mouth daily.       All past medical history, surgical history, allergies, family history, immunizations andmedications were updated in the EMR today and reviewed under the history and medication portions of their EMR.     ROS: Negative, with the exception of above mentioned in HPI   Objective:  BP 131/73 (BP Location: Left Arm, Patient Position: Sitting, Cuff Size: Normal)   Pulse 74   Temp 98.1  F (36.7 C) (Oral)   Resp 16   Wt 154 lb (69.9 kg)   SpO2 97%   BMI 27.28 kg/m  Body mass index is 27.28 kg/m. Gen: Afebrile. No acute distress. Nontoxic in appearance.  HENT: AT. [place].  MMM.  Eyes:Pupils Equal Round Reactive to light, Extraocular movements intact,  Conjunctiva without redness, discharge or icterus.  Neck/lymp/endocrine: Supple,no lymphadenopathy, CV: RRR  Chest: CTAB, no wheeze or crackles Skin: no rashes, purpura or petechiae.  Neuro:  Normal gait. PERLA. EOMi. Alert. Oriented x3  No exam data present No results found. No results found for this or any previous visit (from the past 24 hour(s)).  Assessment/Plan: MAGUIRE KILLMER is a 73 y.o. female present for OV for  Pneumonia of left upper lobe due to infectious organism [facility]) - much improved. Continue to take it easy, Slowly return to normal routine.  - Use mucinec DM at night to help with drainage.  - rpt cxr  Ordered for her to get for follow up on PNA within the next week.  - DG Chest 2 View; Future - F/U dependent on CXR results.      Reviewed expectations re: course of current medical issues.  Discussed self-management of symptoms.  Outlined signs and symptoms indicating need for more acute intervention.  Patient verbalized understanding and all questions were answered.  Patient received an After-Visit Summary.    No orders of the defined types were placed in this encounter.      Note is dictated utilizing voice recognition software. Although note has been proof read prior to signing, occasional typographical errors still can be missed. If any questions arise, please do not hesitate to call for verification.   electronically signed by:  Howard Pouch, DO  [place]

## 2019-01-08 NOTE — Patient Instructions (Signed)
You look much better.  CXR at [facility] in next few days for follow up.  Continue mucinex DM as needed, especially a few hours  before bed.   I will call you with your results once we receive them.

## 2019-03-21 IMAGING — DX DG CERVICAL SPINE 2 OR 3 VIEWS
4 series · 4 of 4 positions shown · non-contrast
Comparison: None.

CLINICAL DATA: Bilateral arm and hand numbness for the past month.

EXAM:
CERVICAL SPINE - 2-3 VIEW

[c-spine lat]
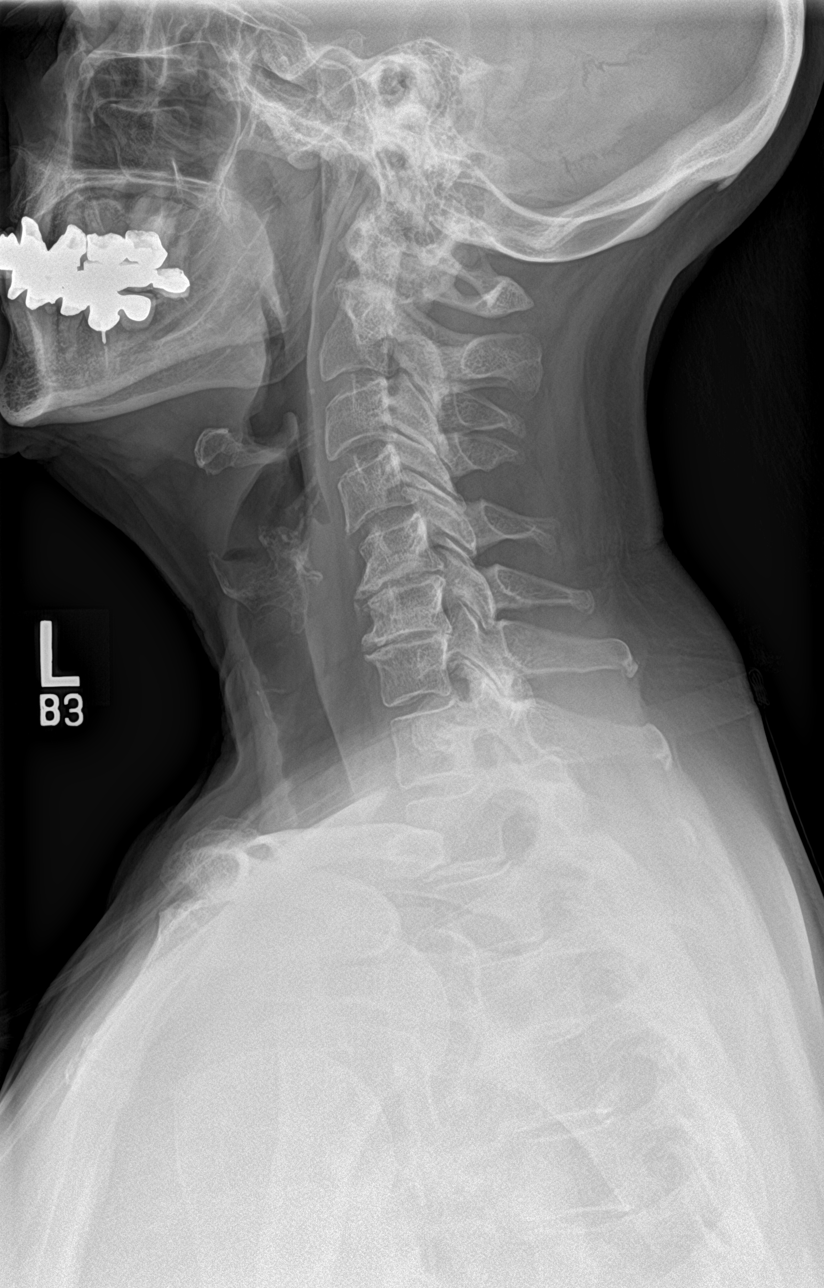

[c-spine ap]
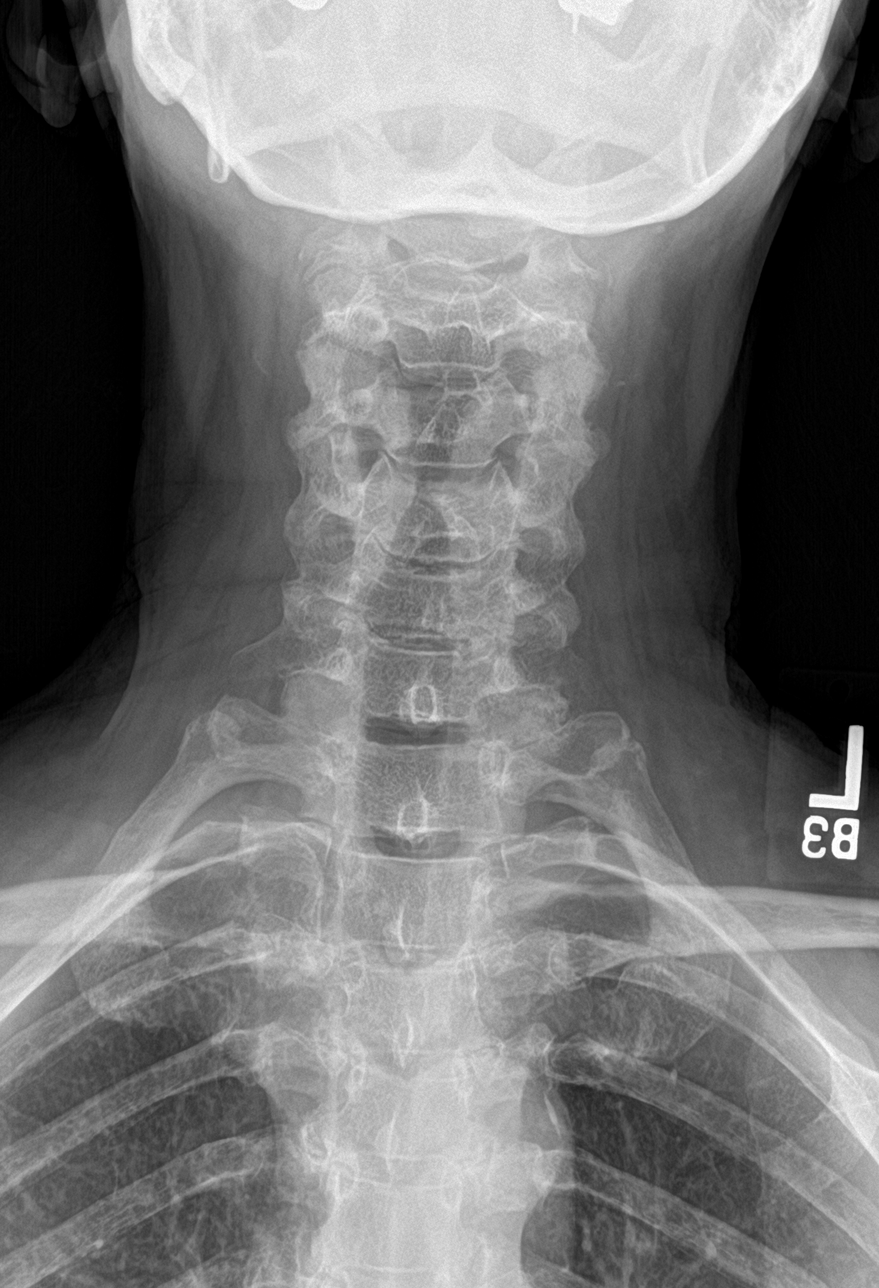

[c-spine open mouth]
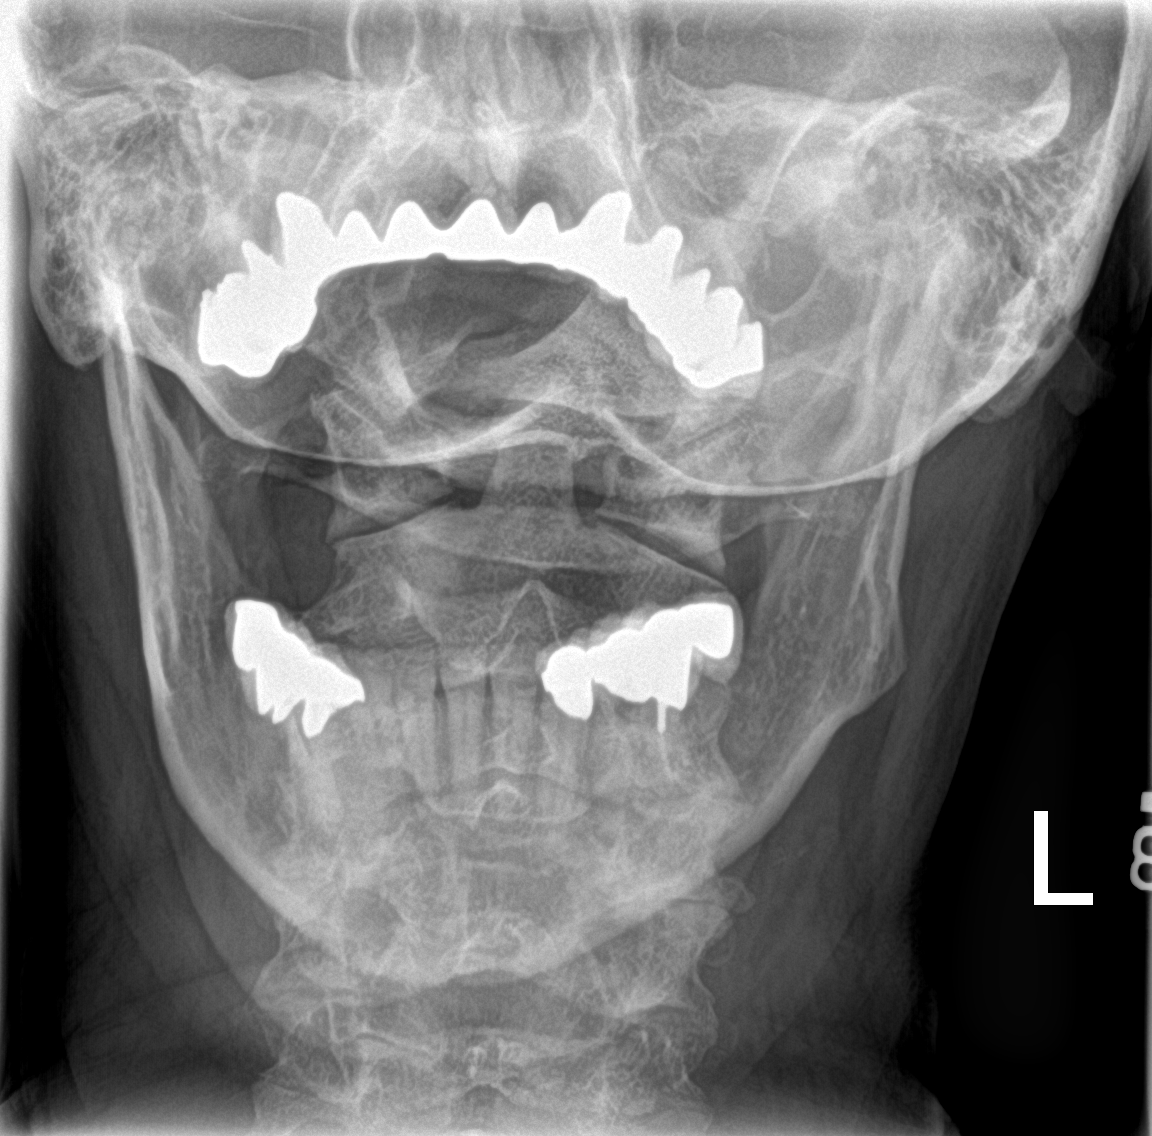

[swimmer]
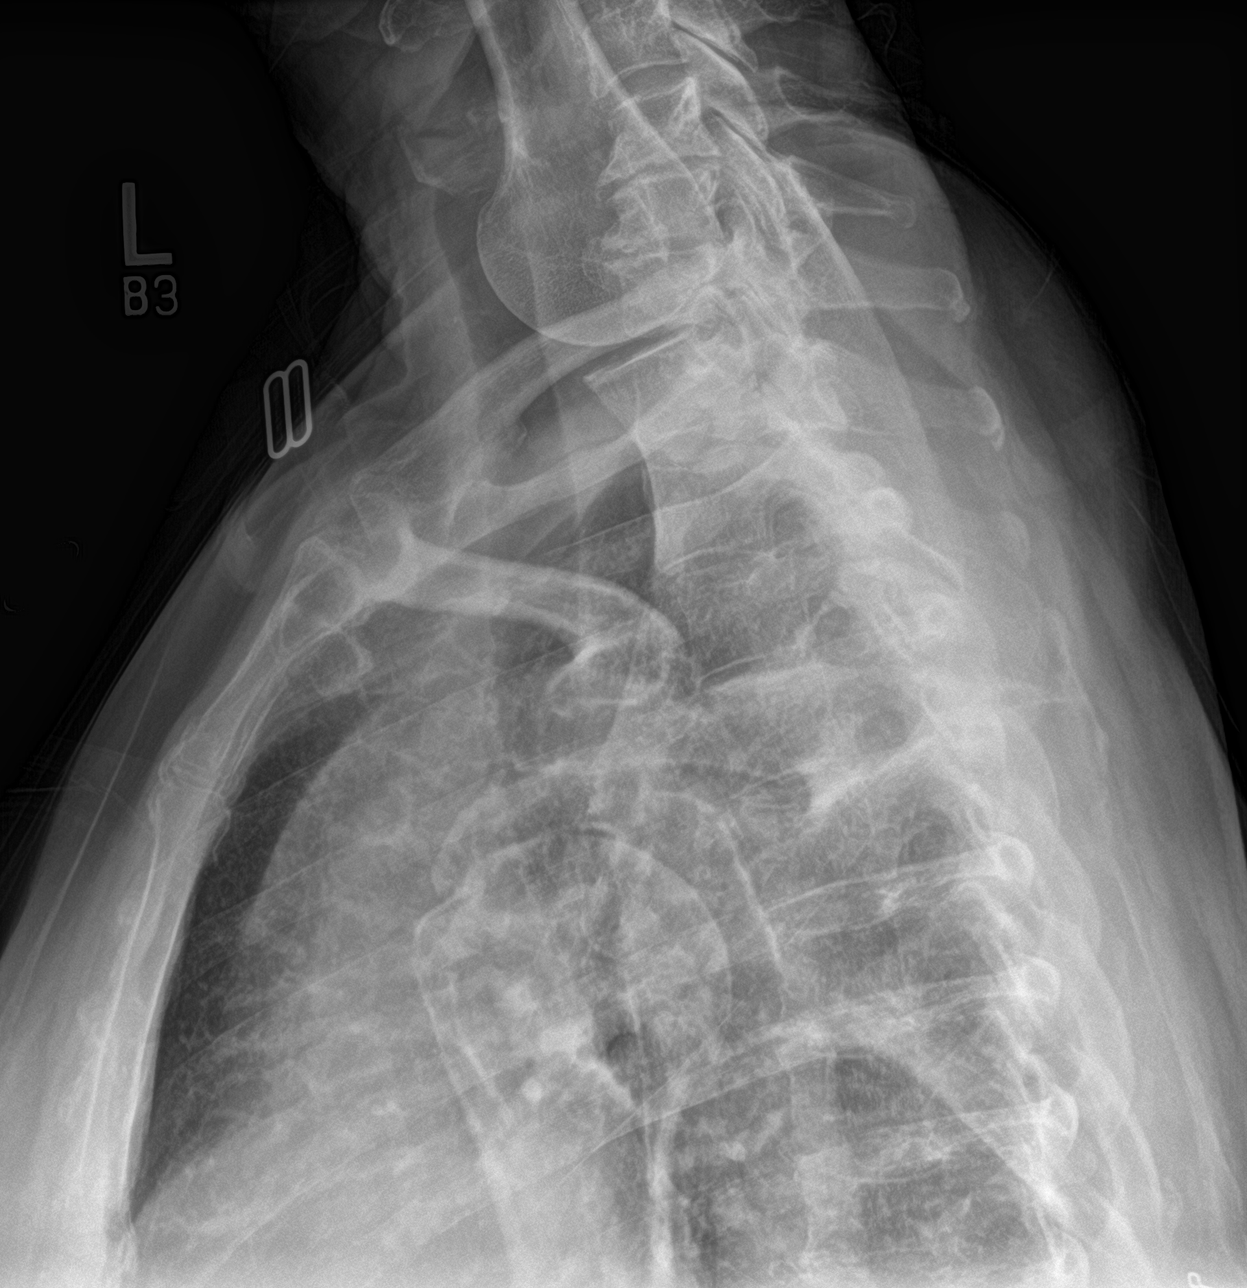

[4 of 4 positions shown; findings below may reference images not displayed]

FINDINGS: The lateral view is diagnostic to the T1-T2 level. There is no acute
fracture or subluxation. Vertebral body heights are preserved.
Slight focal reversal of the normal cervical lordosis at C5-C6.
Moderate disc height loss and uncovertebral hypertrophy at C5-C6 and
C6-C7. Normal prevertebral soft tissues.
IMPRESSION: 1. Moderate cervical spondylosis at C5-C6 and C6-C7.

## 2019-05-15 ENCOUNTER — Telehealth: Payer: Self-pay | Admitting: Family Medicine

## 2019-05-15 MED ORDER — FLUTICASONE PROPIONATE 50 MCG/ACT NA SUSP: 1.0000 | Freq: Every day | NASAL | 0 refills | Status: DC

## 2019-05-15 MED ORDER — FLUTICASONE PROPIONATE 50 MCG/ACT NA SUSP: 1.0000 | Freq: Every day | NASAL | 3 refills | Status: DC

## 2019-05-15 NOTE — Telephone Encounter (Signed)
Pt was called and said the [place] called her and stated they switched systems so all her refills for some of her medications were lost. She needed new RX sent to them and one flonase sent to local pharmacy because she was going to run out before the [place] could get her the refills. One sent to local pharmacy and refills sent to [facility]

## 2019-05-15 NOTE — Telephone Encounter (Signed)
Patient left VM requesting refill of Flonase to be sent to the [place].

## 2019-07-05 DIAGNOSIS — H5213 Myopia, bilateral: Secondary | ICD-10-CM | POA: Diagnosis not present

## 2019-07-05 DIAGNOSIS — Z135 Encounter for screening for eye and ear disorders: Secondary | ICD-10-CM | POA: Diagnosis not present

## 2019-07-05 DIAGNOSIS — H524 Presbyopia: Secondary | ICD-10-CM | POA: Diagnosis not present

## 2019-07-05 DIAGNOSIS — H52223 Regular astigmatism, bilateral: Secondary | ICD-10-CM | POA: Diagnosis not present

## 2019-07-05 DIAGNOSIS — H04123 Dry eye syndrome of bilateral lacrimal glands: Secondary | ICD-10-CM | POA: Diagnosis not present

## 2019-07-05 DIAGNOSIS — H25813 Combined forms of age-related cataract, bilateral: Secondary | ICD-10-CM | POA: Diagnosis not present

## 2019-07-05 DIAGNOSIS — H02403 Unspecified ptosis of bilateral eyelids: Secondary | ICD-10-CM | POA: Diagnosis not present

## 2019-07-06 DIAGNOSIS — H527 Unspecified disorder of refraction: Secondary | ICD-10-CM | POA: Diagnosis not present

## 2019-07-06 DIAGNOSIS — H43813 Vitreous degeneration, bilateral: Secondary | ICD-10-CM | POA: Diagnosis not present

## 2019-07-06 DIAGNOSIS — H52203 Unspecified astigmatism, bilateral: Secondary | ICD-10-CM | POA: Diagnosis not present

## 2019-07-06 DIAGNOSIS — H25813 Combined forms of age-related cataract, bilateral: Secondary | ICD-10-CM | POA: Diagnosis not present

## 2019-07-06 DIAGNOSIS — H35372 Puckering of macula, left eye: Secondary | ICD-10-CM | POA: Diagnosis not present

## 2019-07-06 DIAGNOSIS — H40003 Preglaucoma, unspecified, bilateral: Secondary | ICD-10-CM | POA: Diagnosis not present

## 2019-07-11 ENCOUNTER — Ambulatory Visit (INDEPENDENT_AMBULATORY_CARE_PROVIDER_SITE_OTHER): Payer: Medicare Other | Admitting: Family Medicine

## 2019-07-11 ENCOUNTER — Other Ambulatory Visit: Payer: Self-pay

## 2019-07-11 ENCOUNTER — Encounter: Payer: Self-pay | Admitting: Family Medicine

## 2019-07-11 VITALS — BP 119/70 | HR 72 | Temp 98.0°F | Resp 16 | Ht 63.0 in | Wt 151.0 lb

## 2019-07-11 DIAGNOSIS — R350 Frequency of micturition: Secondary | ICD-10-CM

## 2019-07-11 DIAGNOSIS — K5792 Diverticulitis of intestine, part unspecified, without perforation or abscess without bleeding: Secondary | ICD-10-CM | POA: Diagnosis not present

## 2019-07-11 LAB — POCT URINALYSIS DIPSTICK
Bilirubin, UA: NEGATIVE
Blood, UA: NEGATIVE
Glucose, UA: NEGATIVE
Ketones, UA: NEGATIVE
Leukocytes, UA: NEGATIVE
Nitrite, UA: NEGATIVE
Protein, UA: NEGATIVE
Spec Grav, UA: <=1.005 — AB (ref 1.010–1.025)
Urobilinogen, UA: 0.2 E.U./dL
pH, UA: 6 (ref 5.0–8.0)

## 2019-07-11 MED ORDER — AMOXICILLIN-POT CLAVULANATE 875-125 MG PO TABS: 1.0000 | ORAL_TABLET | Freq: Two times a day (BID) | ORAL | 0 refills | Status: DC

## 2019-07-11 NOTE — Patient Instructions (Signed)
I called in the Augmentin for you to start today.   Hydrate.    Diverticulitis  Diverticulitis is when small pockets in your large intestine (colon) get infected or swollen. This causes stomach pain and watery poop (diarrhea). These pouches are called diverticula. They form in people who have a condition called diverticulosis. Follow these instructions at home: Medicines  Take over-the-counter and prescription medicines only as told by your doctor. These include: ? Antibiotics. ? Pain medicines. ? Fiber pills. ? Probiotics. ? Stool softeners.  Do not drive or use heavy machinery while taking prescription pain medicine.  If you were prescribed an antibiotic, take it as told. Do not stop taking it even if you feel better. General instructions   Follow a diet as told by your doctor.  When you feel better, your doctor may tell you to change your diet. You may need to eat a lot of fiber. Fiber makes it easier to poop (have bowel movements). Healthy foods with fiber include: ? Berries. ? Beans. ? Lentils. ? Green vegetables.  Exercise 3 or more times a week. Aim for 30 minutes each time. Exercise enough to sweat and make your heart beat faster.  Keep all follow-up visits as told. This is important. You may need to have an exam of the large intestine. This is called a colonoscopy. Contact a doctor if:  Your pain does not get better.  You have a hard time eating or drinking.  You are not pooping like normal. Get help right away if:  Your pain gets worse.  Your problems do not get better.  Your problems get worse very fast.  You have a fever.  You throw up (vomit) more than one time.  You have poop that is: ? Bloody. ? Black. ? Tarry. Summary  Diverticulitis is when small pockets in your large intestine (colon) get infected or swollen.  Take medicines only as told by your doctor.  Follow a diet as told by your doctor. This information is not intended to  replace advice given to you by your health care provider. Make sure you discuss any questions you have with your health care provider. Document Released: 05/10/2008 Document Revised: 11/04/2017 Document Reviewed: 12/09/2016 Elsevier Patient Education  2020 [employer].

## 2019-07-11 NOTE — Progress Notes (Signed)
Victoria Bryant , 1946-11-07, 73 y.o., female MRN: [id number] Patient Care Team    Relationship Specialty Notifications Start End  [name], DO PCP - General Family Medicine  06/18/16   [name], MD Consulting Physician Obstetrics and Gynecology  10/06/16   [name], MD (Inactive) Consulting Physician Gastroenterology  10/08/16   Ladene Artist, MD Consulting Physician Gastroenterology  10/24/18   Lyndal Pulley, DO Consulting Physician Family Medicine  10/24/18   Allyn Kenner, MD  Dermatology  10/24/18     Chief Complaint  Patient presents with  . Abdominal Pain    Pts stomach feels swollen and has pain. Denies blood in stool, urine, or fever.      Subjective: Pt presents for an OV with complaints of lower abd pain and bloating of 1 week duration.  Associated symptoms include bloating, urinary frequency. She denies fever, chills,nausea, vomit or BPR. She endorses loose stools. She reports this feels like when she is having a diverticular flare.    Depression screen [facility] 2/9 10/24/2018 03/15/2018 11/21/2017 10/21/2017 07/28/2017  Decreased Interest 0 0 0 1 0  Down, Depressed, Hopeless 0 0 0 1 0  PHQ - 2 Score 0 0 0 2 0  Altered sleeping 0 - 1 0 0  Tired, decreased energy 0 - 0 2 3  Change in appetite 0 - 0 0 0  Feeling bad or failure about yourself  0 - 0 0 0  Trouble concentrating 0 - 0 1 0  Moving slowly or fidgety/restless 0 - 0 0 1  Suicidal thoughts 0 - 0 0 0  PHQ-9 Score 0 - 1 5 4   Difficult doing work/chores Not difficult at all - - Not difficult at all -    Allergies  Allergen Reactions  . Naproxen Diarrhea  . Paroxetine     REACTION: unspecified  . Clarithromycin Nausea Only  . Doxycycline Hyclate Nausea And Vomiting  . Erythromycin Ethylsuccinate Nausea Only  . Lexapro [Escitalopram Oxalate] Other (See Comments)    Paresthesia    Social History   Social History Narrative   Married, one daughter lives next door to her.   Retired  Art gallery manager.   Tob: 20 pack yr hx, quit age 14.   No alc.   Past Medical History:  Diagnosis Date  . Arthritis   . COLONIC POLYPS, HX OF 04/21/2007  . DEPRESSION 05/26/2009  . Diverticulitis   . GERD 04/08/2008  . Interstitial cystitis    ? per pt: was told by urologist she has this  . 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 MENOPAUSAL SYNDROME 04/21/2007  . NECK PAIN, CHRONIC 04/21/2007   Past Surgical History:  Procedure Laterality Date  . COLONOSCOPY  x 2   First time polyps, second time no polyps: recall 10 yrs (next is approx 2024 per pt report)  . INCISION AND DRAINAGE ABSCESS ANAL    . POLYPECTOMY    . TONSILLECTOMY    . UPPER GASTROINTESTINAL ENDOSCOPY    . VAGINAL HYSTERECTOMY     Nonmalignant reason.  Ovaries still in.   Family History  Problem Relation Age of Onset  . Bladder Cancer Mother   . Lung cancer Mother   . Breast cancer Paternal Aunt   . ALS Daughter   . Fibromyalgia Sister   . Heart disease Brother   . Alcohol abuse Brother   . Colon cancer Neg Hx   . Colon polyps Neg Hx   . Esophageal  cancer Neg Hx   . Rectal cancer Neg Hx   . Stomach cancer Neg Hx    Allergies as of 07/11/2019      Reactions   Naproxen Diarrhea   Paroxetine    REACTION: unspecified   Clarithromycin Nausea Only   Doxycycline Hyclate Nausea And Vomiting   Erythromycin Ethylsuccinate Nausea Only   Lexapro [escitalopram Oxalate] Other (See Comments)   Paresthesia      Medication List       Accurate as of July 11, 2019  9:49 AM. If you have any questions, ask your nurse or doctor.        amoxicillin-clavulanate 875-125 MG tablet Commonly known as: AUGMENTIN Take 1 tablet by mouth 2 (two) times daily. Started by: Howard Pouch, DO   Baclofen 5 MG Tabs Take 5 mg by mouth 2 (two) times daily as needed.   fluticasone 50 MCG/ACT nasal spray Commonly known as: FLONASE Place 1 spray into both nostrils daily.   HAIR/SKIN/NAILS PO Take by mouth.    hyoscyamine 0.125 MG SL tablet Commonly known as: LEVSIN SL Place 1 tablet (0.125 mg total) under the tongue every 8 (eight) hours as needed.   JUICE PLUS FIBRE PO Take by mouth.   meloxicam 15 MG tablet Commonly known as: MOBIC Take 1 tablet (15 mg total) by mouth daily.   pantoprazole 40 MG tablet Commonly known as: PROTONIX Take 1 tablet (40 mg total) by mouth daily.   RA PROBIOTIC GUMMIES PO Take by mouth.   tolterodine 4 MG 24 hr capsule Commonly known as: DETROL LA Take 1 capsule (4 mg total) by mouth daily as needed.   TURMERIC PO Take by mouth.   VITAMIN B 12 PO Take by mouth.   vitamin C 1000 MG tablet Take 1,000 mg by mouth daily.       All past medical history, surgical history, allergies, family history, immunizations andmedications were updated in the EMR today and reviewed under the history and medication portions of their EMR.     ROS: Negative, with the exception of above mentioned in HPI   Objective:  BP 119/70 (BP Location: Right Arm, Patient Position: Sitting, Cuff Size: Normal)   Pulse 72   Temp 98 F (36.7 C) (Temporal)   Resp 16   Ht 5\' 3"  (1.6 m)   Wt 151 lb (68.5 kg)   SpO2 99%   BMI 26.75 kg/m  Body mass index is 26.75 kg/m. Gen: Afebrile. No acute distress. Nontoxic in appearance, well developed, well nourished.  HENT: AT. [place].  MMM Eyes:Pupils Equal Round Reactive to light, Extraocular movements intact,  Conjunctiva without redness, discharge or icterus. CV: RRR Chest: CTAB, no wheeze or crackles. Good air movement, normal resp effort.  Abd: Soft. Flat. Mild distention, mild TTP suprapubic and LLQ.. BS present. no Masses palpated. No rebound or guarding.  MSK: No CVA tenderness.  Skin: no rashes, purpura or petechiae.  Neuro:  Normal gait. PERLA. EOMi. Alert. Oriented x3  Psych: Normal affect, dress and demeanor. Normal speech. Normal thought content and judgment.  No exam data present No results found. Results for orders  placed or performed in visit on 07/11/19 (from the past 24 hour(s))  POCT urinalysis dipstick     Status: Abnormal   Collection Time: 07/11/19  9:35 AM  Result Value Ref Range   Color, UA yellow    Clarity, UA clear    Glucose, UA Negative Negative   Bilirubin, UA negative    Ketones, UA  negative    Spec Grav, UA <=1.005 (A) 1.010 - 1.025   Blood, UA negative    pH, UA 6.0 5.0 - 8.0   Protein, UA Negative Negative   Urobilinogen, UA 0.2 0.2 or 1.0 E.U./dL   Nitrite, UA negative    Leukocytes, UA Negative Negative   Appearance clear    Odor none     Assessment/Plan: Victoria Bryant is a 73 y.o. female present for OV for  Frequency of urination - POCT urinalysis dipstick> normal. Will send for culture to be complete - Urine Culture  Diverticulitis Pain consitent with early diverticular flare for her.  - hydrate.  Augmentin BID 10 days. Continue probiotic. She prefers Augmentin over cipro/flagyl. She does have a mild vaginal odor w/ot DC-- discussed with her today if it does not resolve would need to treat with flagyl and she understands- she will call in- no appt needed.   - F/U 2 weeks if abd pain not resolved, sooner if needed.    Reviewed expectations re: course of current medical issues.  Discussed self-management of symptoms.  Outlined signs and symptoms indicating need for more acute intervention.  Patient verbalized understanding and all questions were answered.  Patient received an After-Visit Summary.    Orders Placed This Encounter  Procedures  . Urine Culture  . POCT urinalysis dipstick   > 25 minutes spent with patient, >50% of time spent face to face     Note is dictated utilizing voice recognition software. Although note has been proof read prior to signing, occasional typographical errors still can be missed. If any questions arise, please do not hesitate to call for verification.   electronically signed by:  Howard Pouch, DO  [place]

## 2019-07-13 LAB — URINE CULTURE
MICRO NUMBER:: 740779
SPECIMEN QUALITY:: ADEQUATE

## 2019-07-13 LAB — EXTRA URINE SPECIMEN

## 2019-07-15 DIAGNOSIS — H25812 Combined forms of age-related cataract, left eye: Secondary | ICD-10-CM | POA: Diagnosis not present

## 2019-07-15 DIAGNOSIS — Z20828 Contact with and (suspected) exposure to other viral communicable diseases: Secondary | ICD-10-CM | POA: Diagnosis not present

## 2019-07-15 DIAGNOSIS — Z01812 Encounter for preprocedural laboratory examination: Secondary | ICD-10-CM | POA: Diagnosis not present

## 2019-07-17 IMAGING — DX DG CHEST 2V
2 series · 2 of 2 positions shown · non-contrast
Comparison: Chest x-ray dated December 18, 2018.

CLINICAL DATA: Follow-up pneumonia.

EXAM:
CHEST - 2 VIEW

[chest pa]
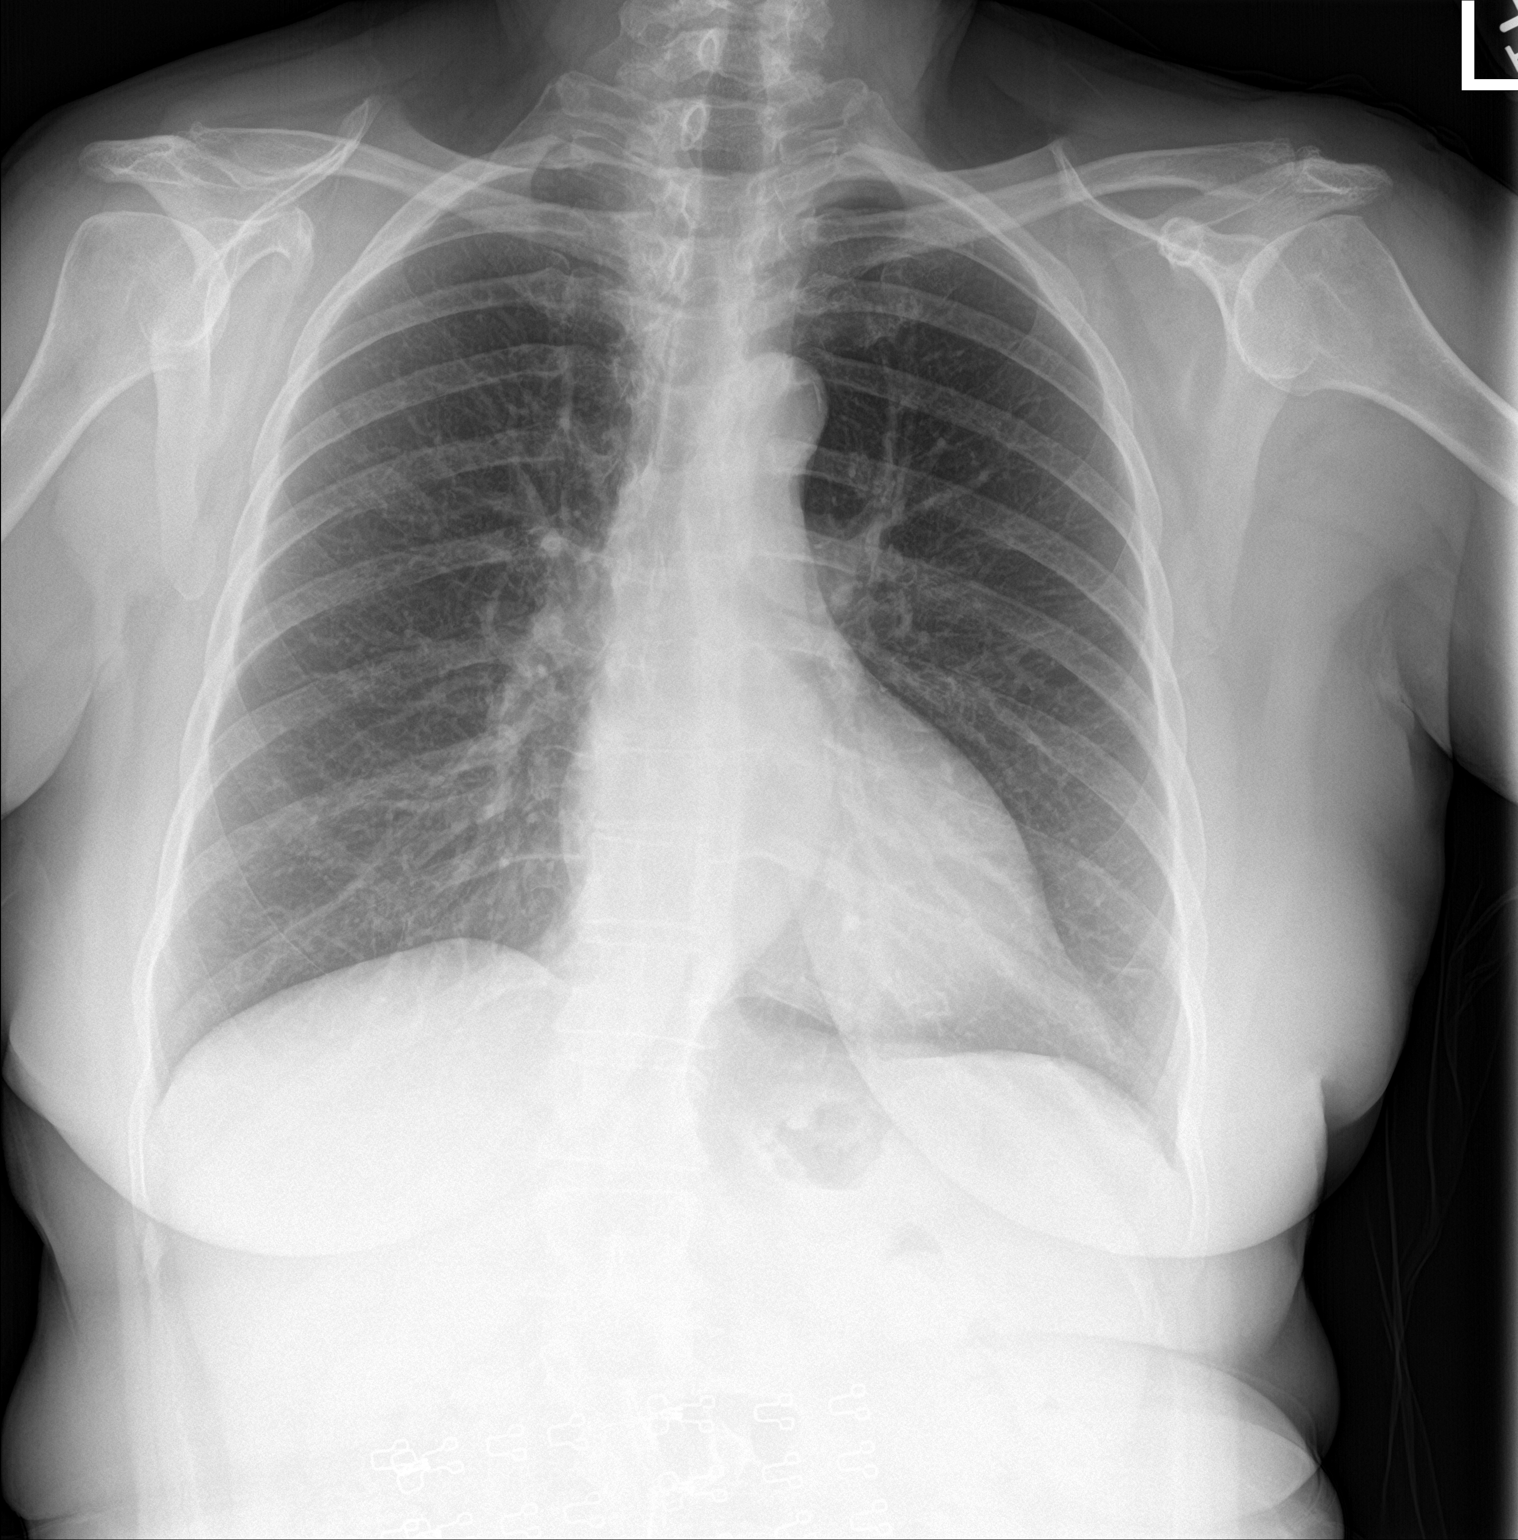

[chest lat]
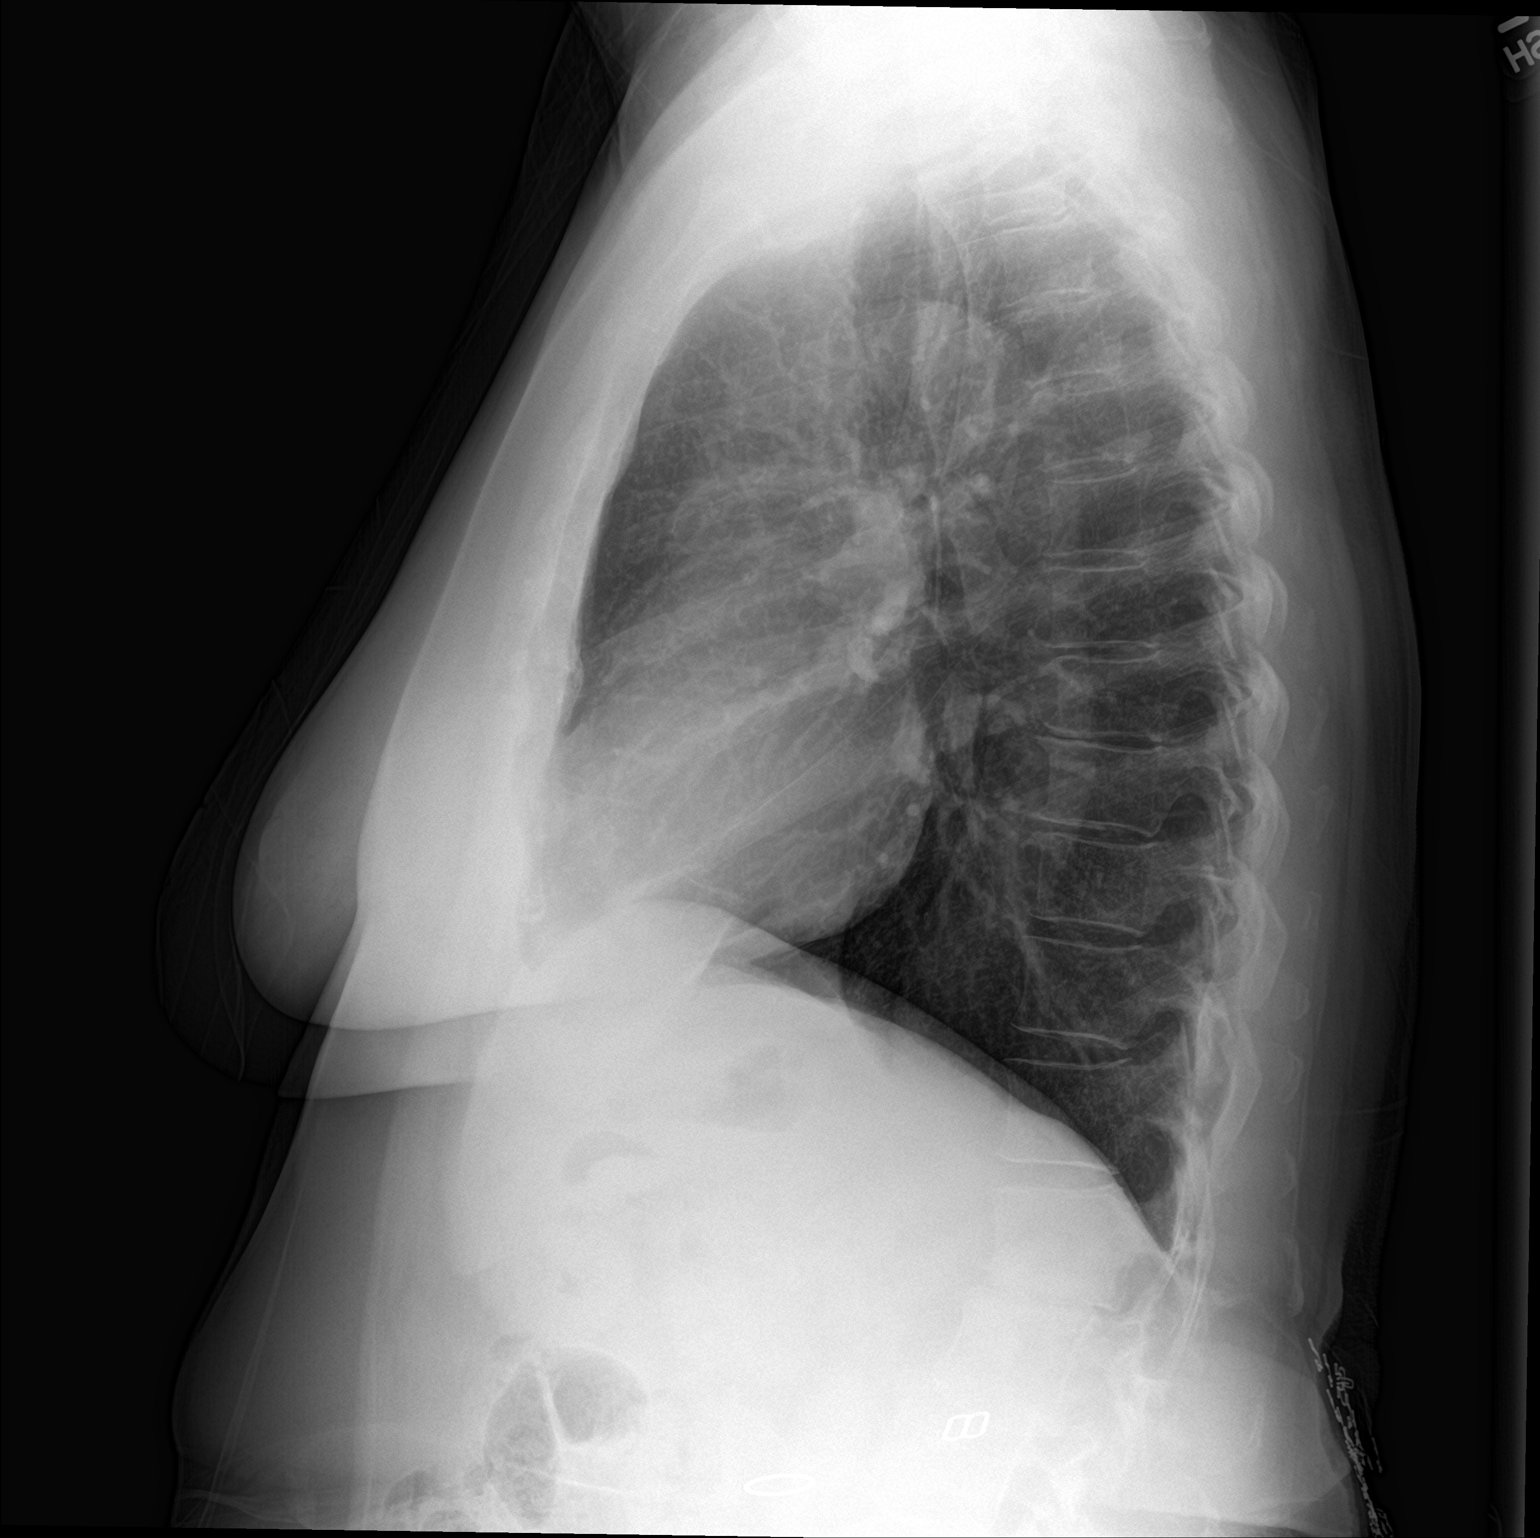

[2 of 2 positions shown; findings below may reference images not displayed]

FINDINGS: The heart size and mediastinal contours are within normal limits.
Normal pulmonary vascularity. Resolved left upper lobe opacity. No
focal consolidation, pleural effusion, or pneumothorax. No acute
osseous abnormality.
IMPRESSION: 1. Resolved left upper lobe pneumonia.

## 2019-07-19 DIAGNOSIS — K210 Gastro-esophageal reflux disease with esophagitis: Secondary | ICD-10-CM | POA: Diagnosis not present

## 2019-07-19 DIAGNOSIS — H40003 Preglaucoma, unspecified, bilateral: Secondary | ICD-10-CM | POA: Diagnosis not present

## 2019-07-19 DIAGNOSIS — H25813 Combined forms of age-related cataract, bilateral: Secondary | ICD-10-CM | POA: Diagnosis not present

## 2019-07-19 DIAGNOSIS — H25812 Combined forms of age-related cataract, left eye: Secondary | ICD-10-CM | POA: Insufficient documentation

## 2019-07-19 DIAGNOSIS — H52222 Regular astigmatism, left eye: Secondary | ICD-10-CM | POA: Insufficient documentation

## 2019-07-19 DIAGNOSIS — Z881 Allergy status to other antibiotic agents status: Secondary | ICD-10-CM | POA: Diagnosis not present

## 2019-07-19 DIAGNOSIS — H43813 Vitreous degeneration, bilateral: Secondary | ICD-10-CM | POA: Diagnosis not present

## 2019-07-19 DIAGNOSIS — H52203 Unspecified astigmatism, bilateral: Secondary | ICD-10-CM | POA: Diagnosis not present

## 2019-07-19 DIAGNOSIS — F419 Anxiety disorder, unspecified: Secondary | ICD-10-CM | POA: Diagnosis not present

## 2019-07-19 DIAGNOSIS — H527 Unspecified disorder of refraction: Secondary | ICD-10-CM | POA: Diagnosis not present

## 2019-07-19 DIAGNOSIS — Z809 Family history of malignant neoplasm, unspecified: Secondary | ICD-10-CM | POA: Diagnosis not present

## 2019-07-19 DIAGNOSIS — Z79899 Other long term (current) drug therapy: Secondary | ICD-10-CM | POA: Diagnosis not present

## 2019-07-19 HISTORY — DX: Regular astigmatism, left eye: H52.222

## 2019-07-19 HISTORY — DX: Combined forms of age-related cataract, left eye: H25.812

## 2019-07-20 ENCOUNTER — Encounter: Payer: Self-pay | Admitting: Family Medicine

## 2019-07-20 DIAGNOSIS — H43813 Vitreous degeneration, bilateral: Secondary | ICD-10-CM | POA: Diagnosis not present

## 2019-07-20 DIAGNOSIS — H52201 Unspecified astigmatism, right eye: Secondary | ICD-10-CM | POA: Diagnosis not present

## 2019-07-20 DIAGNOSIS — Z961 Presence of intraocular lens: Secondary | ICD-10-CM | POA: Diagnosis not present

## 2019-07-20 DIAGNOSIS — H35372 Puckering of macula, left eye: Secondary | ICD-10-CM | POA: Diagnosis not present

## 2019-07-20 DIAGNOSIS — H527 Unspecified disorder of refraction: Secondary | ICD-10-CM | POA: Diagnosis not present

## 2019-07-20 DIAGNOSIS — H25811 Combined forms of age-related cataract, right eye: Secondary | ICD-10-CM | POA: Diagnosis not present

## 2019-07-20 DIAGNOSIS — H40003 Preglaucoma, unspecified, bilateral: Secondary | ICD-10-CM | POA: Diagnosis not present

## 2019-07-22 DIAGNOSIS — Z20828 Contact with and (suspected) exposure to other viral communicable diseases: Secondary | ICD-10-CM | POA: Diagnosis not present

## 2019-07-22 DIAGNOSIS — Z01812 Encounter for preprocedural laboratory examination: Secondary | ICD-10-CM | POA: Diagnosis not present

## 2019-07-22 DIAGNOSIS — H2511 Age-related nuclear cataract, right eye: Secondary | ICD-10-CM | POA: Diagnosis not present

## 2019-07-26 DIAGNOSIS — F419 Anxiety disorder, unspecified: Secondary | ICD-10-CM | POA: Diagnosis not present

## 2019-07-26 DIAGNOSIS — H40003 Preglaucoma, unspecified, bilateral: Secondary | ICD-10-CM | POA: Diagnosis not present

## 2019-07-26 DIAGNOSIS — H52203 Unspecified astigmatism, bilateral: Secondary | ICD-10-CM | POA: Diagnosis not present

## 2019-07-26 DIAGNOSIS — H25813 Combined forms of age-related cataract, bilateral: Secondary | ICD-10-CM | POA: Diagnosis not present

## 2019-07-26 DIAGNOSIS — Z809 Family history of malignant neoplasm, unspecified: Secondary | ICD-10-CM | POA: Diagnosis not present

## 2019-07-26 DIAGNOSIS — H43813 Vitreous degeneration, bilateral: Secondary | ICD-10-CM | POA: Diagnosis not present

## 2019-07-26 DIAGNOSIS — Z79899 Other long term (current) drug therapy: Secondary | ICD-10-CM | POA: Diagnosis not present

## 2019-07-26 DIAGNOSIS — Z881 Allergy status to other antibiotic agents status: Secondary | ICD-10-CM | POA: Diagnosis not present

## 2019-07-26 DIAGNOSIS — H527 Unspecified disorder of refraction: Secondary | ICD-10-CM | POA: Diagnosis not present

## 2019-07-26 DIAGNOSIS — H25811 Combined forms of age-related cataract, right eye: Secondary | ICD-10-CM | POA: Diagnosis not present

## 2019-07-26 DIAGNOSIS — K210 Gastro-esophageal reflux disease with esophagitis: Secondary | ICD-10-CM | POA: Diagnosis not present

## 2019-07-27 DIAGNOSIS — H40003 Preglaucoma, unspecified, bilateral: Secondary | ICD-10-CM | POA: Diagnosis not present

## 2019-07-27 DIAGNOSIS — H35372 Puckering of macula, left eye: Secondary | ICD-10-CM | POA: Diagnosis not present

## 2019-07-27 DIAGNOSIS — H43813 Vitreous degeneration, bilateral: Secondary | ICD-10-CM | POA: Diagnosis not present

## 2019-07-27 DIAGNOSIS — Z961 Presence of intraocular lens: Secondary | ICD-10-CM | POA: Diagnosis not present

## 2019-07-27 DIAGNOSIS — H527 Unspecified disorder of refraction: Secondary | ICD-10-CM | POA: Diagnosis not present

## 2019-08-07 ENCOUNTER — Telehealth: Payer: Self-pay | Admitting: Family Medicine

## 2019-08-07 ENCOUNTER — Other Ambulatory Visit: Payer: Self-pay

## 2019-08-07 ENCOUNTER — Ambulatory Visit (INDEPENDENT_AMBULATORY_CARE_PROVIDER_SITE_OTHER): Payer: Medicare Other | Admitting: Family Medicine

## 2019-08-07 ENCOUNTER — Encounter: Payer: Self-pay | Admitting: Family Medicine

## 2019-08-07 DIAGNOSIS — Z23 Encounter for immunization: Secondary | ICD-10-CM | POA: Diagnosis not present

## 2019-08-07 DIAGNOSIS — M791 Myalgia, unspecified site: Secondary | ICD-10-CM

## 2019-08-07 DIAGNOSIS — R109 Unspecified abdominal pain: Secondary | ICD-10-CM

## 2019-08-07 MED ORDER — AMOXICILLIN-POT CLAVULANATE 875-125 MG PO TABS: 1.0000 | ORAL_TABLET | Freq: Two times a day (BID) | ORAL | 0 refills | Status: DC

## 2019-08-07 NOTE — Telephone Encounter (Signed)
Can we fit her In today or should she make a VV with another MD? Please advise

## 2019-08-07 NOTE — Telephone Encounter (Signed)
She can be worked into the [facility]

## 2019-08-07 NOTE — Telephone Encounter (Signed)
Patient called in with diverticulitis flair. Patient request call back from [place]. No appointments available.

## 2019-08-07 NOTE — Telephone Encounter (Signed)
Pt was called and placed on schedule at 1:30pm. Pt agreed

## 2019-08-07 NOTE — Patient Instructions (Signed)
Augmentin prescribed.  If a flare occurs again in the next few months we would have you talk to your GI as well and likely need to get image.    Diverticulitis  Diverticulitis is when small pockets in your large intestine (colon) get infected or swollen. This causes stomach pain and watery poop (diarrhea). These pouches are called diverticula. They form in people who have a condition called diverticulosis. Follow these instructions at home: Medicines  Take over-the-counter and prescription medicines only as told by your doctor. These include: ? Antibiotics. ? Pain medicines. ? Fiber pills. ? Probiotics. ? Stool softeners.  Do not drive or use heavy machinery while taking prescription pain medicine.  If you were prescribed an antibiotic, take it as told. Do not stop taking it even if you feel better. General instructions   Follow a diet as told by your doctor.  When you feel better, your doctor may tell you to change your diet. You may need to eat a lot of fiber. Fiber makes it easier to poop (have bowel movements). Healthy foods with fiber include: ? Berries. ? Beans. ? Lentils. ? Green vegetables.  Exercise 3 or more times a week. Aim for 30 minutes each time. Exercise enough to sweat and make your heart beat faster.  Keep all follow-up visits as told. This is important. You may need to have an exam of the large intestine. This is called a colonoscopy. Contact a doctor if:  Your pain does not get better.  You have a hard time eating or drinking.  You are not pooping like normal. Get help right away if:  Your pain gets worse.  Your problems do not get better.  Your problems get worse very fast.  You have a fever.  You throw up (vomit) more than one time.  You have poop that is: ? Bloody. ? Black. ? Tarry. Summary  Diverticulitis is when small pockets in your large intestine (colon) get infected or swollen.  Take medicines only as told by your doctor.   Follow a diet as told by your doctor. This information is not intended to replace advice given to you by your health care provider. Make sure you discuss any questions you have with your health care provider. Document Released: 05/10/2008 Document Revised: 11/04/2017 Document Reviewed: 12/09/2016 Elsevier Patient Education  2020 [employer].

## 2019-08-07 NOTE — Progress Notes (Signed)
Victoria Bryant , 02-Jul-1946, 73 y.o., female MRN: [id number] Patient Care Team    Relationship Specialty Notifications Start End  [name], DO PCP - General Family Medicine  06/18/16   [name], MD Consulting Physician Obstetrics and Gynecology  10/06/16   [name], MD (Inactive) Consulting Physician Gastroenterology  10/08/16   Ladene Artist, MD Consulting Physician Gastroenterology  10/24/18   Lyndal Pulley, DO Consulting Physician Family Medicine  10/24/18   Allyn Kenner, MD  Dermatology  10/24/18     Chief Complaint  Patient presents with  . Abdominal Pain    she is having a flare up from her diverticulitis. started friday. she feels very achy. no fever. no nausea or vomiiting. pain in lower stomach     Subjective:  Victoria Bryant is a 73 y.o. female present to today for abdominal pain since Friday (Aug. 28). She was treated for similar pain/symptoms less than 1 month ago. UTI was ruled out. She feels achy. She denies fever, nausea or vomit. She denies dysuria. Pain is located in her lower abdomen.   CT 10/27/2018: Sigmoid diverticulosis. Inflammatory stranding around the mid to distal sigmoid colon compatible with active diverticulitis. Stomach and small bowel decompressed, unremarkable. Appendix is normal. Colonoscopy 09/2018: normal with the exception of Moderate diverticulosis in the left colon.  Prior note:  Pt presents for an OV with complaints of lower abd pain and bloating of 1 week duration.  Associated symptoms include bloating, urinary frequency. She denies fever, chills,nausea, vomit or BPR. She endorses loose stools. She reports this feels like when she is having a diverticular flare.    Depression screen [facility] 2/9 10/24/2018 03/15/2018 11/21/2017 10/21/2017 07/28/2017  Decreased Interest 0 0 0 1 0  Down, Depressed, Hopeless 0 0 0 1 0  PHQ - 2 Score 0 0 0 2 0  Altered sleeping 0 - 1 0 0  Tired, decreased energy 0 - 0 2 3  Change in  appetite 0 - 0 0 0  Feeling bad or failure about yourself  0 - 0 0 0  Trouble concentrating 0 - 0 1 0  Moving slowly or fidgety/restless 0 - 0 0 1  Suicidal thoughts 0 - 0 0 0  PHQ-9 Score 0 - 1 5 4   Difficult doing work/chores Not difficult at all - - Not difficult at all -    Allergies  Allergen Reactions  . Naproxen Diarrhea  . Paroxetine     REACTION: unspecified  . Clarithromycin Nausea Only  . Doxycycline Hyclate Nausea And Vomiting  . Erythromycin Ethylsuccinate Nausea Only  . Lexapro [Escitalopram Oxalate] Other (See Comments)    Paresthesia    Social History   Social History Narrative   Married, one daughter lives next door to her.   Retired Art gallery manager.   Tob: 20 pack yr hx, quit age 55.   No alc.   Past Medical History:  Diagnosis Date  . Arthritis   . COLONIC POLYPS, HX OF 04/21/2007  . DEPRESSION 05/26/2009  . Diverticulitis   . Epiretinal membrane, left eye   . GERD 04/08/2008  . Glaucoma suspect of both eyes    [facility]  . Interstitial cystitis    ? per pt: was told by urologist she has this  . 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 MENOPAUSAL SYNDROME 04/21/2007  . NECK PAIN, CHRONIC 04/21/2007  . Posterior vitreous detachment of both eyes    [facility]  .  Pseudophakia of both eyes    [facility]  . Unspecified disorder of refraction    both eyes   Past Surgical History:  Procedure Laterality Date  . COLONOSCOPY  x 2   First time polyps, second time no polyps: recall 10 yrs (next is approx 2024 per pt report)  . INCISION AND DRAINAGE ABSCESS ANAL    . POLYPECTOMY    . TONSILLECTOMY    . UPPER GASTROINTESTINAL ENDOSCOPY    . VAGINAL HYSTERECTOMY     Nonmalignant reason.  Ovaries still in.   Family History  Problem Relation Age of Onset  . Bladder Cancer Mother   . Lung cancer Mother   . Breast cancer Paternal Aunt   . ALS Daughter   . Fibromyalgia Sister   . Heart disease  Brother   . Alcohol abuse Brother   . Colon cancer Neg Hx   . Colon polyps Neg Hx   . Esophageal cancer Neg Hx   . Rectal cancer Neg Hx   . Stomach cancer Neg Hx    Allergies as of 08/07/2019      Reactions   Naproxen Diarrhea   Paroxetine    REACTION: unspecified   Clarithromycin Nausea Only   Doxycycline Hyclate Nausea And Vomiting   Erythromycin Ethylsuccinate Nausea Only   Lexapro [escitalopram Oxalate] Other (See Comments)   Paresthesia      Medication List       Accurate as of August 07, 2019  1:30 PM. If you have any questions, ask your nurse or doctor.        amoxicillin-clavulanate 875-125 MG tablet Commonly known as: AUGMENTIN Take 1 tablet by mouth 2 (two) times daily.   Baclofen 5 MG Tabs Take 5 mg by mouth 2 (two) times daily as needed.   fluticasone 50 MCG/ACT nasal spray Commonly known as: FLONASE Place 1 spray into both nostrils daily.   HAIR/SKIN/NAILS PO Take by mouth.   hyoscyamine 0.125 MG SL tablet Commonly known as: LEVSIN SL Place 1 tablet (0.125 mg total) under the tongue every 8 (eight) hours as needed.   JUICE PLUS FIBRE PO Take by mouth.   meloxicam 15 MG tablet Commonly known as: MOBIC Take 1 tablet (15 mg total) by mouth daily.   pantoprazole 40 MG tablet Commonly known as: PROTONIX Take 1 tablet (40 mg total) by mouth daily.   RA PROBIOTIC GUMMIES PO Take by mouth.   tolterodine 4 MG 24 hr capsule Commonly known as: DETROL LA Take 1 capsule (4 mg total) by mouth daily as needed.   TURMERIC PO Take by mouth.   VITAMIN B 12 PO Take by mouth.   vitamin C 1000 MG tablet Take 1,000 mg by mouth daily.       All past medical history, surgical history, allergies, family history, immunizations andmedications were updated in the EMR today and reviewed under the history and medication portions of their EMR.     ROS: Negative, with the exception of above mentioned in HPI   Objective:  BP 128/74 (BP Location: Right Arm,  Patient Position: Sitting, Cuff Size: Normal)   Pulse 67   Temp 97.9 F (36.6 C) (Temporal)   Resp 16   Ht 5\' 3"  (1.6 m)   Wt 150 lb 6 oz (68.2 kg)   SpO2 98%   BMI 26.64 kg/m  Body mass index is 26.64 kg/m. Gen: Afebrile. No acute distress.  HENT: AT. [place].  Eyes:Pupils Equal Round Reactive to light, Extraocular movements  intact,  Conjunctiva without redness, discharge or icterus. CV: RRR Chest: CTAB, no wheeze or crackles Abd: Soft. Flat. TTP bilateral lower quadrants. ND. BS present. no Masses palpated.  Neuro: Normal gait. PERLA. EOMi. Alert. Oriented.    No exam data present No results found. No results found for this or any previous visit (from the past 24 hour(s)).  Assessment/Plan: Victoria Bryant is a 73 y.o. female present for OV for  Diverticulitis Pain consitent with early diverticular flare for her. She thinks she did it by accidentally eating raisin  Discussed if another flare with in 3 months would have her see the GI doctor again. - hydrate.  Augmentin BID 10 days. Continue probiotic.  - F/U 2 weeks if abd pain not resolved, sooner if needed.    Reviewed expectations re: course of current medical issues.  Discussed self-management of symptoms.  Outlined signs and symptoms indicating need for more acute intervention.  Patient verbalized understanding and all questions were answered.  Patient received an After-Visit Summary.    No orders of the defined types were placed in this encounter.      Note is dictated utilizing voice recognition software. Although note has been proof read prior to signing, occasional typographical errors still can be missed. If any questions arise, please do not hesitate to call for verification.   electronically signed by:  Howard Pouch, DO  [place]

## 2019-09-21 DIAGNOSIS — J0140 Acute pansinusitis, unspecified: Secondary | ICD-10-CM | POA: Diagnosis not present

## 2019-10-29 DIAGNOSIS — Z961 Presence of intraocular lens: Secondary | ICD-10-CM | POA: Diagnosis not present

## 2019-10-29 DIAGNOSIS — H0015 Chalazion left lower eyelid: Secondary | ICD-10-CM | POA: Diagnosis not present

## 2019-11-06 ENCOUNTER — Ambulatory Visit: Payer: Medicare Other | Admitting: Family Medicine

## 2019-11-06 ENCOUNTER — Ambulatory Visit: Payer: Medicare Other

## 2019-11-10 DIAGNOSIS — Z1159 Encounter for screening for other viral diseases: Secondary | ICD-10-CM | POA: Diagnosis not present

## 2019-11-10 DIAGNOSIS — R5381 Other malaise: Secondary | ICD-10-CM | POA: Diagnosis not present

## 2019-11-10 DIAGNOSIS — J019 Acute sinusitis, unspecified: Secondary | ICD-10-CM | POA: Diagnosis not present

## 2019-11-19 ENCOUNTER — Ambulatory Visit (INDEPENDENT_AMBULATORY_CARE_PROVIDER_SITE_OTHER): Payer: Medicare Other | Admitting: Family Medicine

## 2019-11-19 ENCOUNTER — Other Ambulatory Visit: Payer: Self-pay

## 2019-11-19 ENCOUNTER — Encounter: Payer: Self-pay | Admitting: Family Medicine

## 2019-11-19 VITALS — BP 130/78 | Temp 97.2°F | Ht 63.0 in | Wt 153.0 lb

## 2019-11-19 DIAGNOSIS — G479 Sleep disorder, unspecified: Secondary | ICD-10-CM

## 2019-11-19 DIAGNOSIS — Z0000 Encounter for general adult medical examination without abnormal findings: Secondary | ICD-10-CM

## 2019-11-19 DIAGNOSIS — R309 Painful micturition, unspecified: Secondary | ICD-10-CM | POA: Diagnosis not present

## 2019-11-19 DIAGNOSIS — M5386 Other specified dorsopathies, lumbar region: Secondary | ICD-10-CM

## 2019-11-19 DIAGNOSIS — K219 Gastro-esophageal reflux disease without esophagitis: Secondary | ICD-10-CM

## 2019-11-19 DIAGNOSIS — R7989 Other specified abnormal findings of blood chemistry: Secondary | ICD-10-CM

## 2019-11-19 DIAGNOSIS — I700 Atherosclerosis of aorta: Secondary | ICD-10-CM | POA: Diagnosis not present

## 2019-11-19 DIAGNOSIS — R7303 Prediabetes: Secondary | ICD-10-CM

## 2019-11-19 DIAGNOSIS — F418 Other specified anxiety disorders: Secondary | ICD-10-CM | POA: Diagnosis not present

## 2019-11-19 MED ORDER — PANTOPRAZOLE SODIUM 40 MG PO TBEC: 40.0000 mg | DELAYED_RELEASE_TABLET | Freq: Every day | ORAL | 3 refills | Status: DC

## 2019-11-19 MED ORDER — ALPRAZOLAM 0.5 MG PO TABS: 0.5000 mg | ORAL_TABLET | Freq: Two times a day (BID) | ORAL | 1 refills | Status: DC | PRN

## 2019-11-19 MED ORDER — MELOXICAM 15 MG PO TABS: 15.0000 mg | ORAL_TABLET | Freq: Every day | ORAL | 3 refills | Status: DC

## 2019-11-19 MED ORDER — BACLOFEN 5 MG PO TABS: 5.0000 mg | ORAL_TABLET | Freq: Two times a day (BID) | ORAL | 1 refills | Status: DC | PRN

## 2019-11-19 MED ORDER — TETANUS-DIPHTH-ACELL PERTUSSIS 5-2.5-18.5 LF-MCG/0.5 IM SUSP: 0.5000 mL | Freq: Once | INTRAMUSCULAR | 0 refills | Status: AC

## 2019-11-19 MED ORDER — ZOSTER VAC RECOMB ADJUVANTED 50 MCG/0.5ML IM SUSR: 0.5000 mL | Freq: Once | INTRAMUSCULAR | 1 refills | Status: AC

## 2019-11-19 MED ORDER — TOLTERODINE TARTRATE ER 4 MG PO CP24: 4.0000 mg | ORAL_CAPSULE | Freq: Every day | ORAL | 3 refills | Status: DC | PRN

## 2019-11-19 NOTE — Progress Notes (Signed)
VIRTUAL VISIT VIA VIDEO- failed  I connected with Victoria Bryant on 11/19/19 at  8:00 AM EST by a video enabled telemedicine application and verified that I am speaking with the correct person using two identifiers. Location patient: Home Location provider: [facility], Office Persons participating in the virtual visit: Patient, Dr. Raoul Pitch and R.Baker, LPN  I discussed the limitations of evaluation and management by telemedicine and the availability of in person appointments. The patient expressed understanding and agreed to proceed.   Interactive audio and video telecommunications were attempted between this provider and patient, however failed, due to patient having technical difficulties OR patient did not have access to video capability. We continued and completed visit with audio only.     Medicare [place] Chief Complaint  Patient presents with  . Medicare Wellness    Patient Care Team    Relationship Specialty Notifications Start End  [name], DO PCP - General Family Medicine  06/18/16   [name], MD Consulting Physician Obstetrics and Gynecology  10/06/16   [name], MD (Inactive) Consulting Physician Gastroenterology  10/08/16   Ladene Artist, MD Consulting Physician Gastroenterology  10/24/18   Lyndal Pulley, DO Consulting Physician Family Medicine  10/24/18   Allyn Kenner, MD  Dermatology  10/24/18      History of Present Ilness: Victoria Bryant, 73 y.o. , female presents today for Medicare wellness visit.   She does complain of increased anxiety/stress.  She reports this is the time of the year her husband passed away in she typically has some increased stress and anxiety around this time of the year.  She has been using Xanax 0.5 mg in the past very infrequently to help her through the rough times and stress.  She reports sometimes she takes half a pill, sometimes a whole pill sometimes to twice daily depending upon her  anxiety.  She would like refills of this medication today.  [place] controlled substance database was reviewed today and appropriate.  Patient reports she also need refills on her Detrol LA, baclofen, Protonix and Mobic.  Past medical, surgical, family and social histories reviewed (including experiences with illnesses, hospital stays, operations, injuries, and treatments):  Past Medical History:  Diagnosis Date  . Arthritis   . COLONIC POLYPS, HX OF 04/21/2007  . DEPRESSION 05/26/2009  . Diverticulitis   . Epiretinal membrane, left eye   . GERD 04/08/2008  . Glaucoma suspect of both eyes    [facility]  . Interstitial cystitis    ? per pt: was told by urologist she has this  . 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 MENOPAUSAL SYNDROME 04/21/2007  . NECK PAIN, CHRONIC 04/21/2007  . Posterior vitreous detachment of both eyes    [facility]  . Pseudophakia of both eyes    [facility]  . Unspecified disorder of refraction    both eyes   All allergies reviewed Allergies  Allergen Reactions  . Naproxen Diarrhea  . Paroxetine     REACTION: unspecified  . Clarithromycin Nausea Only  . Doxycycline Hyclate Nausea And Vomiting  . Erythromycin Ethylsuccinate Nausea Only  . Lexapro [Escitalopram Oxalate] Other (See Comments)    Paresthesia    Past Surgical History:  Procedure Laterality Date  . COLONOSCOPY  x 2   First time polyps, second time no polyps: recall 10 yrs (next is approx 2024 per pt report)  . INCISION AND DRAINAGE ABSCESS ANAL    .  POLYPECTOMY    . TONSILLECTOMY    . UPPER GASTROINTESTINAL ENDOSCOPY    . VAGINAL HYSTERECTOMY     Nonmalignant reason.  Ovaries still in.   Family History  Problem Relation Age of Onset  . Bladder Cancer Mother   . Lung cancer Mother   . Breast cancer Paternal Aunt   . ALS Daughter   . Fibromyalgia Sister   . Heart disease Brother   . Alcohol abuse Brother   . Colon cancer Neg  Hx   . Colon polyps Neg Hx   . Esophageal cancer Neg Hx   . Rectal cancer Neg Hx   . Stomach cancer Neg Hx    Social History   Social History Narrative   Married, one daughter lives next door to her.   Retired Art gallery manager.   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   No alc.    All medications verified Allergies as of 11/19/2019      Reactions   Naproxen Diarrhea   Paroxetine    REACTION: unspecified   Clarithromycin Nausea Only   Doxycycline Hyclate Nausea And Vomiting   Erythromycin Ethylsuccinate Nausea Only   Lexapro [escitalopram Oxalate] Other (See Comments)   Paresthesia      Medication List       Accurate as of November 19, 2019  8:18 AM. If you have any questions, ask your nurse or doctor.        STOP taking these medications   amoxicillin-clavulanate 875-125 MG tablet Commonly known as: AUGMENTIN Stopped by: Howard Pouch, DO     TAKE these medications   Baclofen 5 MG Tabs Take 5 mg by mouth 2 (two) times daily as needed.   fluticasone 50 MCG/ACT nasal spray Commonly known as: FLONASE Place 1 spray into both nostrils daily.   HAIR/SKIN/NAILS PO Take by mouth.   hyoscyamine 0.125 MG SL tablet Commonly known as: LEVSIN SL Place 1 tablet (0.125 mg total) under the tongue every 8 (eight) hours as needed.   JUICE PLUS FIBRE PO Take by mouth.   meloxicam 15 MG tablet Commonly known as: MOBIC Take 1 tablet (15 mg total) by mouth daily.   pantoprazole 40 MG tablet Commonly known as: PROTONIX Take 1 tablet (40 mg total) by mouth daily.   RA PROBIOTIC GUMMIES PO Take by mouth.   tolterodine 4 MG 24 hr capsule Commonly known as: DETROL LA Take 1 capsule (4 mg total) by mouth daily as needed.   TURMERIC PO Take by mouth.   VITAMIN B 12 PO Take by mouth.   vitamin C 1000 MG tablet Take 1,000 mg by mouth daily.   Vitamin D (Cholecalciferol) 25 MCG (1000 UT) Caps Take 2 capsules by mouth daily.   Zinc 50 MG Tabs Take by mouth.        Health maintenance:  Pap-N/A      Mammo-09/27/2016, [facility]. Scheduled for 11/2017.      Dexa scan-09/27/2016, normal. Will confirm with [facility].     [facility]-Colonoscopy >> No additional screening indicated per Dr. Fuller Plan C Mammogram (50-74): 12/28/2018.  Completed. Immunizations: Tetanus immunization is due, prescription printed for her and mailed.  Influenza vaccination up-to-date 2020.  Shingrix prescription printed and mailed to her.  Pneumonia vaccinations completed. Infectious disease screening: Infectious disease screenings completed DEXA: Bone density completed 11/2018 repeat in 5 years. Glaucoma screen: 8//2020  Hearing: Whisper test, no barriers identified.  Depression screen [facility] 2/9 11/19/2019 10/24/2018 03/15/2018 11/21/2017 10/21/2017  Decreased  Interest 0 0 0 0 1  Down, Depressed, Hopeless 0 0 0 0 1  PHQ - 2 Score 0 0 0 0 2  Altered sleeping - 0 - 1 0  Tired, decreased energy - 0 - 0 2  Change in appetite - 0 - 0 0  Feeling bad or failure about yourself  - 0 - 0 0  Trouble concentrating - 0 - 0 1  Moving slowly or fidgety/restless - 0 - 0 0  Suicidal thoughts - 0 - 0 0  PHQ-9 Score - 0 - 1 5  Difficult doing work/chores - Not difficult at all - - Not difficult at all   GAD 7 : Generalized Anxiety Score 06/09/2017  Nervous, Anxious, on Edge 3  Control/stop worrying 3  Worry too much - different things 3  Trouble relaxing 3  Restless 1  Easily annoyed or irritable 3  Afraid - awful might happen 3  Total GAD 7 Score 19  Anxiety Difficulty Somewhat difficult    Cognitive assessment:  Word recall (daisy, blue, church): 3/3  Immunizations: Immunization History  Administered Date(s) Administered  . Fluad Quad(high Dose 65+) 08/07/2019  . Influenza Split 09/20/2011, 09/25/2012  . Influenza Whole 08/27/2010  . Influenza, High Dose Seasonal PF 10/06/2015, 12/26/2017, 09/08/2018  . Influenza,inj,Quad PF,6+ Mos 09/26/2013  . Influenza-Unspecified  09/17/2014  . Pneumococcal Conjugate-13 10/06/2015  . Pneumococcal Polysaccharide-23 09/20/2011  . Td 05/26/2009  . Zoster 09/25/2012    Exercise: Current Exercise Habits: The patient does not participate in regular exercise at present Exercise limited by: None identified  Diet: Regular Functional Status Survey: Get up and go test: steady and less than 20 seconds.  Is the patient deaf or have difficulty hearing?: No Does the patient have difficulty seeing, even when wearing glasses/contacts?: No Does the patient have difficulty concentrating, remembering, or making decisions?: No Does the patient have difficulty walking or climbing stairs?: No Does the patient have difficulty dressing or bathing?: No Does the patient have difficulty doing errands alone such as visiting a doctor's office or shopping?: No Current Exercise Habits: The patient does not participate in regular exercise at present Exercise limited by: None identified Fall Risk  11/19/2019 10/24/2018 05/04/2018 10/21/2017 10/12/2017  Falls in the past year? 0 0 No No No  Number falls in past yr: 0 - - - -  Follow up Education provided;Falls evaluation completed;Falls prevention discussed - - - -    Advanced Care Planning: N- Pt reports she has and will bring in copy.  Cardiovascular Screening Blood Tests Lipid Panel     Component Value Date/Time   CHOL 192 01/06/2017 0818   TRIG 122.0 01/06/2017 0818   HDL 49.00 01/06/2017 0818   CHOLHDL 4 01/06/2017 0818   VLDL 24.4 01/06/2017 0818   LDLCALC 118 (H) 01/06/2017 0818   LDLDIRECT 164.2 09/26/2013 1043    Diabetes Screening Tests Lab Results  Component Value Date   HGBA1C 5.8 04/14/2017    Assessment and Plan: Depression with anxiety Patient aware Xanax is a controlled substance.  She uses daily around this time of the year, and then as needed.  Agreed to refill prescription today.  Will need face-to-face visit if needing additional refills for this medication.   Patient reports understanding. [place] controlled substance database reviewed today and appropriate. - ALPRAZolam (XANAX) 0.5 MG tablet; Take 1 tablet (0.5 mg total) by mouth 2 (two) times daily as needed for anxiety.  Dispense: 180 tablet; Refill: 1 - TSH; Future Atherosclerosis  of aorta ([place]) -Consider low-dose statin for cardiovascular benefits.  Will discuss after lipids are resulted.  Cholesterol has always been very well controlled without medications. - CBC; Future - Comp Met (CMET); Future - Lipid panel; Future Elevated TSH - TSH; Future GERD: Stable.  Refills provided on Protonix 40 mg daily. Pain emptying bladder: Condition is stable with use of Detrol LA.  Refills provided for her today. Prediabetes Continue to watch dietary habits and perform routine exercise. - CBC; Future - Comp Met (CMET); Future - Hemoglobin A1c; Future Scoliosis, left sciatica lumbar region degenerative disease: Refills today on Mobic 15 mg daily and baclofen 5 mg twice daily as needed.  Stable condition. Sleep disturbance Uses Xanax nightly when having difficulty sleeping secondary to anxiety. - TSH; Future Medicare annual wellness visit, subsequent Patient was encouraged to exercise greater than 150 minutes a week. Patient was encouraged to choose a diet filled with fresh fruits and vegetables, and lean meats. AVS provided to patient today for education/recommendation on gender specific health and safety maintenance.  Yearly follow-up for Medicare wellness. Chronic medical condition follow-up every 6 months if needing refills.  Orders Placed This Encounter  Procedures  . CBC  . Comp Met (CMET)  . TSH  . Lipid panel  . Hemoglobin A1c    Annual subsequent Medicare wellness visit completed today as well as > 15 minutes spent with patient, > 50% of that time face to face discussing multiple chronic conditions, refilling medications-including controlled substances.   Electronically Signed  by: Howard Pouch, DO [place]

## 2019-11-21 ENCOUNTER — Encounter: Payer: Self-pay | Admitting: Family Medicine

## 2019-11-21 NOTE — Patient Instructions (Signed)
Health Maintenance After Age 74 After age 74, you are at a higher risk for certain long-term diseases and infections as well as injuries from falls. Falls are a major cause of broken bones and head injuries in people who are older than age 74. Getting regular preventive care can help to keep you healthy and well. Preventive care includes getting regular testing and making lifestyle changes as recommended by your health care provider. Talk with your health care provider about:  Which screenings and tests you should have. A screening is a test that checks for a disease when you have no symptoms.  A diet and exercise plan that is right for you. What should I know about screenings and tests to prevent falls? Screening and testing are the best ways to find a health problem early. Early diagnosis and treatment give you the best chance of managing medical conditions that are common after age 74. Certain conditions and lifestyle choices may make you more likely to have a fall. Your health care provider may recommend:  Regular vision checks. Poor vision and conditions such as cataracts can make you more likely to have a fall. If you wear glasses, make sure to get your prescription updated if your vision changes.  Medicine review. Work with your health care provider to regularly review all of the medicines you are taking, including over-the-counter medicines. Ask your health care provider about any side effects that may make you more likely to have a fall. Tell your health care provider if any medicines that you take make you feel dizzy or sleepy.  Osteoporosis screening. Osteoporosis is a condition that causes the bones to get weaker. This can make the bones weak and cause them to break more easily.  Blood pressure screening. Blood pressure changes and medicines to control blood pressure can make you feel dizzy.  Strength and balance checks. Your health care provider may recommend certain tests to check your  strength and balance while standing, walking, or changing positions.  Foot health exam. Foot pain and numbness, as well as not wearing proper footwear, can make you more likely to have a fall.  Depression screening. You may be more likely to have a fall if you have a fear of falling, feel emotionally low, or feel unable to do activities that you used to do.  Alcohol use screening. Using too much alcohol can affect your balance and may make you more likely to have a fall. What actions can I take to lower my risk of falls? General instructions  Talk with your health care provider about your risks for falling. Tell your health care provider if: ? You fall. Be sure to tell your health care provider about all falls, even ones that seem minor. ? You feel dizzy, sleepy, or off-balance.  Take over-the-counter and prescription medicines only as told by your health care provider. These include any supplements.  Eat a healthy diet and maintain a healthy weight. A healthy diet includes low-fat dairy products, low-fat (lean) meats, and fiber from whole grains, beans, and lots of fruits and vegetables. Home safety  Remove any tripping hazards, such as rugs, cords, and clutter.  Install safety equipment such as grab bars in bathrooms and safety rails on stairs.  Keep rooms and walkways well-lit. Activity   Follow a regular exercise program to stay fit. This will help you maintain your balance. Ask your health care provider what types of exercise are appropriate for you.  If you need a cane or   walker, use it as recommended by your health care provider.  Wear supportive shoes that have nonskid soles. Lifestyle  Do not drink alcohol if your health care provider tells you not to drink.  If you drink alcohol, limit how much you have: ? 0-1 drink a day for women. ? 0-2 drinks a day for men.  Be aware of how much alcohol is in your drink. In the [place] one drink equals one typical bottle of beer (12  oz), one-half glass of wine (5 oz), or one shot of hard liquor (1 oz).  Do not use any products that contain nicotine or tobacco, such as cigarettes and e-cigarettes. If you need help quitting, ask your health care provider. Summary  Having a healthy lifestyle and getting preventive care can help to protect your health and wellness after age 74.  Screening and testing are the best way to find a health problem early and help you avoid having a fall. Early diagnosis and treatment give you the best chance for managing medical conditions that are more common for people who are older than age 74.  Falls are a major cause of broken bones and head injuries in people who are older than age 74. Take precautions to prevent a fall at home.  Work with your health care provider to learn what changes you can make to improve your health and wellness and to prevent falls. This information is not intended to replace advice given to you by your health care provider. Make sure you discuss any questions you have with your health care provider. Document Released: 10/05/2017 Document Revised: 03/15/2019 Document Reviewed: 10/05/2017 Elsevier Patient Education  2020 Elsevier Inc.  

## 2019-11-26 ENCOUNTER — Ambulatory Visit (INDEPENDENT_AMBULATORY_CARE_PROVIDER_SITE_OTHER): Payer: Medicare Other | Admitting: Family Medicine

## 2019-11-26 ENCOUNTER — Other Ambulatory Visit: Payer: Self-pay

## 2019-11-26 DIAGNOSIS — R7989 Other specified abnormal findings of blood chemistry: Secondary | ICD-10-CM

## 2019-11-26 DIAGNOSIS — F418 Other specified anxiety disorders: Secondary | ICD-10-CM

## 2019-11-26 DIAGNOSIS — I700 Atherosclerosis of aorta: Secondary | ICD-10-CM

## 2019-11-26 DIAGNOSIS — G479 Sleep disorder, unspecified: Secondary | ICD-10-CM | POA: Diagnosis not present

## 2019-11-26 DIAGNOSIS — R7303 Prediabetes: Secondary | ICD-10-CM

## 2019-11-26 LAB — LIPID PANEL
Cholesterol: 234 mg/dL — ABNORMAL HIGH (ref 0–200)
HDL: 65.6 mg/dL (ref >39.00)
LDL Cholesterol: 142 mg/dL — ABNORMAL HIGH (ref 0–99)
NonHDL: 168.76
Total CHOL/HDL Ratio: 4
Triglycerides: 135 mg/dL (ref 0.0–149.0)
VLDL: 27 mg/dL (ref 0.0–40.0)

## 2019-11-26 LAB — COMPREHENSIVE METABOLIC PANEL
ALT: 14 U/L (ref 0–35)
AST: 18 U/L (ref 0–37)
Albumin: 4.4 g/dL (ref 3.5–5.2)
Alkaline Phosphatase: 87 U/L (ref 39–117)
BUN: 12 mg/dL (ref 6–23)
CO2: 30 mEq/L (ref 19–32)
Calcium: 9.4 mg/dL (ref 8.4–10.5)
Chloride: 105 mEq/L (ref 96–112)
Creatinine, Ser: 0.67 mg/dL (ref 0.40–1.20)
GFR: 85.92 mL/min (ref >60.00)
Glucose, Bld: 88 mg/dL (ref 70–99)
Potassium: 4.8 mEq/L (ref 3.5–5.1)
Sodium: 142 mEq/L (ref 135–145)
Total Bilirubin: 0.4 mg/dL (ref 0.2–1.2)
Total Protein: 6.8 g/dL (ref 6.0–8.3)

## 2019-11-26 LAB — CBC
HCT: 39.9 % (ref 36.0–46.0)
Hemoglobin: 13.2 g/dL (ref 12.0–15.0)
MCHC: 33 g/dL (ref 30.0–36.0)
MCV: 92 fl (ref 78.0–100.0)
Platelets: 251 10*3/uL (ref 150.0–400.0)
RBC: 4.34 Mil/uL (ref 3.87–5.11)
RDW: 12.6 % (ref 11.5–15.5)
WBC: 9.5 10*3/uL (ref 4.0–10.5)

## 2019-11-26 LAB — HEMOGLOBIN A1C: Hgb A1c MFr Bld: 5.7 % (ref 4.6–6.5)

## 2019-11-26 LAB — TSH: TSH: 4.36 u[IU]/mL (ref 0.35–4.50)

## 2019-12-05 ENCOUNTER — Ambulatory Visit (INDEPENDENT_AMBULATORY_CARE_PROVIDER_SITE_OTHER): Payer: Medicare Other | Admitting: Family Medicine

## 2019-12-05 ENCOUNTER — Other Ambulatory Visit: Payer: Self-pay

## 2019-12-05 ENCOUNTER — Encounter: Payer: Self-pay | Admitting: Family Medicine

## 2019-12-05 VITALS — BP 119/74 | HR 77 | Temp 97.5°F | Ht 63.0 in

## 2019-12-05 DIAGNOSIS — J0100 Acute maxillary sinusitis, unspecified: Secondary | ICD-10-CM

## 2019-12-05 DIAGNOSIS — J028 Acute pharyngitis due to other specified organisms: Secondary | ICD-10-CM

## 2019-12-05 MED ORDER — PREDNISONE 20 MG PO TABS: 40.0000 mg | ORAL_TABLET | Freq: Every day | ORAL | 0 refills | Status: DC

## 2019-12-05 MED ORDER — CEFDINIR 300 MG PO CAPS: 300.0000 mg | ORAL_CAPSULE | Freq: Two times a day (BID) | ORAL | 0 refills | Status: DC

## 2019-12-05 NOTE — Progress Notes (Signed)
VIRTUAL VISIT VIA telephone  I connected with Victoria Bryant on 12/05/19 at  8:00 AM EST by a telephone -telemedicine application and verified that I am speaking with the correct person using two identifiers. Location patient: Home Location provider: [facility], Office Persons participating in the virtual visit: Patient, Dr. Raoul Pitch and R.Baker, LPN  I discussed the limitations of evaluation and management by telemedicine and the availability of in person appointments. The patient expressed understanding and agreed to proceed.   SUBJECTIVE Chief Complaint  Patient presents with  . Nasal Congestion    x3 weeks. No fever. No energy.   . Cough  . Sore Throat    [id number] Victoria Bryant is a 73 y.o. female presents today for acute illness.  Patient states her symptoms presented approximately 3-4 weeks ago.  She endorses nasal congestion, thick drainage, sore throat, fatigue and hoarseness. She denies Shortness of breath, wheezing, fever, chills, nausea, vomit or diarrhea. She was seen at the [facility] on [address] in early Dec. She report she tested negative for flu and COVID at that time. She does not recall the days of sx she has prior to testing.  She reports the treated her with a Z-Pak, however she does not feel she has had any improvement and her head congestion and sore throat are worsening.  Patient had a similar presentation beginning of January 2020 that progressed to bronchitis and pneumonia.  ROS: See pertinent positives and negatives per HPI.  Patient Active Problem List   Diagnosis Date Noted  . Combined forms of age-related cataract of left eye 07/19/2019  . Regular astigmatism, left eye 07/19/2019  . Wheezing 12/18/2018  . Bronchitis 12/18/2018  . Interstitial cystitis   . Bilateral carpal tunnel syndrome 09/12/2018  . Pain emptying bladder 06/22/2018  . Scoliosis of lumbar region due to degenerative disease of spine in adult 05/31/2018  . Atherosclerosis of aorta ([place])  05/31/2018  . Sciatica of right side associated with disorder of lumbar spine 05/04/2018  . Numbness and tingling of right leg 05/04/2018  . Sleep disturbance 06/30/2017  . Elevated TSH 06/09/2017  . Diverticulosis of intestine without bleeding 06/09/2017  . Prediabetes 04/14/2017  . Depression with anxiety 05/26/2009  . GERD 04/08/2008  . History of colonic polyps 04/21/2007    Social History   Tobacco Use  . Smoking status: Former Smoker    Quit date: 12/06/1973    Years since quitting: 46.0  . Smokeless tobacco: Never Used  Substance Use Topics  . Alcohol use: No    Current Outpatient Medications:  .  ALPRAZolam (XANAX) 0.5 MG tablet, Take 1 tablet (0.5 mg total) by mouth 2 (two) times daily as needed for anxiety., Disp: 180 tablet, Rfl: 1 .  Ascorbic Acid (VITAMIN C) 1000 MG tablet, Take 1,000 mg by mouth daily., Disp: , Rfl:  .  Baclofen 5 MG TABS, Take 5 mg by mouth 2 (two) times daily as needed., Disp: 180 tablet, Rfl: 1 .  Biotin w/ Vitamins C & E (HAIR/SKIN/NAILS PO), Take by mouth., Disp: , Rfl:  .  Cyanocobalamin (VITAMIN B 12 PO), Take by mouth., Disp: , Rfl:  .  fluticasone (FLONASE) 50 MCG/ACT nasal spray, Place 1 spray into both nostrils daily., Disp: 48 g, Rfl: 0 .  hyoscyamine (LEVSIN SL) 0.125 MG SL tablet, Place 1 tablet (0.125 mg total) under the tongue every 8 (eight) hours as needed., Disp: 90 tablet, Rfl: 3 .  meloxicam (MOBIC) 15 MG tablet, Take 1 tablet (  15 mg total) by mouth daily., Disp: 90 tablet, Rfl: 3 .  Nutritional Supplements (JUICE PLUS FIBRE PO), Take by mouth., Disp: , Rfl:  .  pantoprazole (PROTONIX) 40 MG tablet, Take 1 tablet (40 mg total) by mouth daily., Disp: 90 tablet, Rfl: 3 .  Probiotic Product (RA PROBIOTIC GUMMIES PO), Take by mouth., Disp: , Rfl:  .  tolterodine (DETROL LA) 4 MG 24 hr capsule, Take 1 capsule (4 mg total) by mouth daily as needed., Disp: 90 capsule, Rfl: 3 .  TURMERIC PO, Take by mouth., Disp: , Rfl:  .  Vitamin D,  Cholecalciferol, 25 MCG (1000 UT) CAPS, Take 2 capsules by mouth daily., Disp: , Rfl:  .  Zinc 50 MG TABS, Take by mouth., Disp: , Rfl:   Allergies  Allergen Reactions  . Naproxen Diarrhea  . Paroxetine     REACTION: unspecified  . Clarithromycin Nausea Only  . Doxycycline Hyclate Nausea And Vomiting  . Erythromycin Ethylsuccinate Nausea Only  . Lexapro [Escitalopram Oxalate] Other (See Comments)    Paresthesia     OBJECTIVE: BP 119/74   Pulse 77   Temp (!) 97.5 F (36.4 C) (Oral)   Ht 5\' 3"  (1.6 m)   BMI 27.10 kg/m  Gen: No acute distress.  Hoarseness of voice present Chest: Cough present, no shortness of breath. Skin: No rashes reported Neuro: Alert. Oriented x3  Psych: Normal affect, dress and demeanor. Normal speech. Normal thought content and judgment.  ASSESSMENT AND PLAN: Victoria Bryant is a 72 y.o. female present for  Acute maxillary sinusitis, recurrence not specified/pharyngitis Patient had similar illness last year this time that progressed to pneumonia.  Elected to treat more aggressively given symptoms of 3 weeks duration. Rest, hydrate.  mucinex (DM if cough), nettie pot or nasal saline.  Short burst of prednisone and Omnicef prescribed, take until completed.  If cough present it can last up to 6-8 weeks.  F/U 2 weeks of not improved.  Emergent precautions discussed with patient and she was encouraged if shortness of breath occurs or she is unable able to tolerate p.o. she will need to be seen in the emergency room.  She reports understanding.     13 minutes was spent with patient   Howard Pouch, DO 12/05/2019

## 2019-12-05 NOTE — Patient Instructions (Signed)
Rest.  Hydrate.  Mucinex DM.  Over-the-counter supportive therapy with Tylenol cold and sinus if necessary. Omnicef antibiotic was prescribed today.  Prednisone/steroid short course also provided today. Follow-up in 2 weeks if not improving.  Sooner if worsening.  If shortness of breath presents for unable to tolerate fluids/food please be seen in the emergency room immediately.   Sinusitis, Adult Sinusitis is soreness and swelling (inflammation) of your sinuses. Sinuses are hollow spaces in the bones around your face. They are located:  Around your eyes.  In the middle of your forehead.  Behind your nose.  In your cheekbones. Your sinuses and nasal passages are lined with a fluid called mucus. Mucus drains out of your sinuses. Swelling can trap mucus in your sinuses. This lets germs (bacteria, virus, or fungus) grow, which leads to infection. Most of the time, this condition is caused by a virus. What are the causes? This condition is caused by:  Allergies.  Asthma.  Germs.  Things that block your nose or sinuses.  Growths in the nose (nasal polyps).  Chemicals or irritants in the air.  Fungus (rare). What increases the risk? You are more likely to develop this condition if:  You have a weak body defense system (immune system).  You do a lot of swimming or diving.  You use nasal sprays too much.  You smoke. What are the signs or symptoms? The main symptoms of this condition are pain and a feeling of pressure around the sinuses. Other symptoms include:  Stuffy nose (congestion).  Runny nose (drainage).  Swelling and warmth in the sinuses.  Headache.  Toothache.  A cough that may get worse at night.  Mucus that collects in the throat or the back of the nose (postnasal drip).  Being unable to smell and taste.  Being very tired (fatigue).  A fever.  Sore throat.  Bad breath. How is this diagnosed? This condition is diagnosed based on:  Your  symptoms.  Your medical history.  A physical exam.  Tests to find out if your condition is short-term (acute) or long-term (chronic). Your doctor may: ? Check your nose for growths (polyps). ? Check your sinuses using a tool that has a light (endoscope). ? Check for allergies or germs. ? Do imaging tests, such as an MRI or CT scan. How is this treated? Treatment for this condition depends on the cause and whether it is short-term or long-term.  If caused by a virus, your symptoms should go away on their own within 10 days. You may be given medicines to relieve symptoms. They include: ? Medicines that shrink swollen tissue in the nose. ? Medicines that treat allergies (antihistamines). ? A spray that treats swelling of the nostrils. ? Rinses that help get rid of thick mucus in your nose (nasal saline washes).  If caused by bacteria, your doctor may wait to see if you will get better without treatment. You may be given antibiotic medicine if you have: ? A very bad infection. ? A weak body defense system.  If caused by growths in the nose, you may need to have surgery. Follow these instructions at home: Medicines  Take, use, or apply over-the-counter and prescription medicines only as told by your doctor. These may include nasal sprays.  If you were prescribed an antibiotic medicine, take it as told by your doctor. Do not stop taking the antibiotic even if you start to feel better. Hydrate and humidify   Drink enough water to keep your  pee (urine) pale yellow.  Use a cool mist humidifier to keep the humidity level in your home above 50%.  Breathe in steam for 10-15 minutes, 3-4 times a day, or as told by your doctor. You can do this in the bathroom while a hot shower is running.  Try not to spend time in cool or dry air. Rest  Rest as much as you can.  Sleep with your head raised (elevated).  Make sure you get enough sleep each night. General instructions   Put a  warm, moist washcloth on your face 3-4 times a day, or as often as told by your doctor. This will help with discomfort.  Wash your hands often with soap and water. If there is no soap and water, use hand sanitizer.  Do not smoke. Avoid being around people who are smoking (secondhand smoke).  Keep all follow-up visits as told by your doctor. This is important. Contact a doctor if:  You have a fever.  Your symptoms get worse.  Your symptoms do not get better within 10 days. Get help right away if:  You have a very bad headache.  You cannot stop throwing up (vomiting).  You have very bad pain or swelling around your face or eyes.  You have trouble seeing.  You feel confused.  Your neck is stiff.  You have trouble breathing. Summary  Sinusitis is swelling of your sinuses. Sinuses are hollow spaces in the bones around your face.  This condition is caused by tissues in your nose that become inflamed or swollen. This traps germs. These can lead to infection.  If you were prescribed an antibiotic medicine, take it as told by your doctor. Do not stop taking it even if you start to feel better.  Keep all follow-up visits as told by your doctor. This is important. This information is not intended to replace advice given to you by your health care provider. Make sure you discuss any questions you have with your health care provider. Document Released: 05/10/2008 Document Revised: 04/24/2018 Document Reviewed: 04/24/2018 Elsevier Patient Education  2020 [employer].

## 2019-12-25 ENCOUNTER — Telehealth: Payer: Self-pay | Admitting: Family Medicine

## 2019-12-25 DIAGNOSIS — F418 Other specified anxiety disorders: Secondary | ICD-10-CM

## 2019-12-25 NOTE — Telephone Encounter (Signed)
Please clarify what is needed please.

## 2019-12-25 NOTE — Telephone Encounter (Signed)
Pt was called and LM for her to return call

## 2019-12-25 NOTE — Addendum Note (Signed)
Addended by: Howard Pouch A on: 12/25/2019 05:58 PM   Modules accepted: Orders

## 2019-12-25 NOTE — Telephone Encounter (Signed)
Pt returned call and said that it is a lot more expensive if not sent to the [place]. Would like all medications, except for abx or acute meds sent to the [place]. She states she has been getting it from the [place] this whole time and is not sure why it changed recently. She did just medication [facility]

## 2019-12-25 NOTE — Telephone Encounter (Signed)
Pt needs to be called and ask why she would like this sent to [facility], it is preferred that scheduled medications  Go to local pharmacy.

## 2019-12-25 NOTE — Telephone Encounter (Signed)
Patient called [facility] regarding her Xanax prescription.  Dr. Raoul Pitch told patient she could not send xanax prescription to [facility], which patient says that is incorrect.  [facility] told patient the prescription must be faxed. Only.  [facility] fax # is [phone]  (keep for future**)  She does need prescription sent in at this time, had filled at another pharmacy   Patient can be contacted at [phone].

## 2019-12-26 MED ORDER — ALPRAZOLAM 0.5 MG PO TABS: 0.5000 mg | ORAL_TABLET | Freq: Two times a day (BID) | ORAL | 0 refills | Status: DC | PRN

## 2019-12-26 MED ORDER — MELOXICAM 15 MG PO TABS: 15.0000 mg | ORAL_TABLET | Freq: Every day | ORAL | 3 refills | Status: DC

## 2019-12-26 NOTE — Telephone Encounter (Signed)
It appears her other meds were sent to [facility] at her appt, from what I can tell. The only ones that went local were the xanax and mobic >> which is how these 2 meds had been prescribed. we can transfer  the mobic to [place]. I am willing to transfer the xanax to [place] since she said it so much cheaper. Controlled substances are not typically sent to mail in pharmacies for safety reasons and prescribing reasons/difficlties between States etc.   I have sent both to [facility] please cancel any [facility]ills at [facility]. Thanks.

## 2019-12-26 NOTE — Addendum Note (Signed)
Addended by: Howard Pouch A on: 12/26/2019 08:03 AM   Modules accepted: Orders

## 2019-12-26 NOTE — Telephone Encounter (Signed)
RX was faxed to [place]. RX's, Mobic and xanax, were cancelled at [facility].

## 2020-01-01 DIAGNOSIS — Z1231 Encounter for screening mammogram for malignant neoplasm of breast: Secondary | ICD-10-CM | POA: Diagnosis not present

## 2020-01-01 LAB — HM MAMMOGRAPHY

## 2020-01-02 ENCOUNTER — Encounter: Payer: Self-pay | Admitting: Family Medicine

## 2020-01-15 DIAGNOSIS — L821 Other seborrheic keratosis: Secondary | ICD-10-CM | POA: Diagnosis not present

## 2020-01-15 DIAGNOSIS — Z85820 Personal history of malignant melanoma of skin: Secondary | ICD-10-CM | POA: Diagnosis not present

## 2020-01-15 DIAGNOSIS — D1801 Hemangioma of skin and subcutaneous tissue: Secondary | ICD-10-CM | POA: Diagnosis not present

## 2020-01-15 DIAGNOSIS — Z08 Encounter for follow-up examination after completed treatment for malignant neoplasm: Secondary | ICD-10-CM | POA: Diagnosis not present

## 2020-01-15 DIAGNOSIS — L570 Actinic keratosis: Secondary | ICD-10-CM | POA: Diagnosis not present

## 2020-01-15 DIAGNOSIS — Z1283 Encounter for screening for malignant neoplasm of skin: Secondary | ICD-10-CM | POA: Diagnosis not present

## 2020-01-26 DIAGNOSIS — Z03818 Encounter for observation for suspected exposure to other biological agents ruled out: Secondary | ICD-10-CM | POA: Diagnosis not present

## 2020-01-26 DIAGNOSIS — J029 Acute pharyngitis, unspecified: Secondary | ICD-10-CM | POA: Diagnosis not present

## 2020-01-26 DIAGNOSIS — B349 Viral infection, unspecified: Secondary | ICD-10-CM | POA: Diagnosis not present

## 2020-05-30 ENCOUNTER — Telehealth: Payer: Self-pay

## 2020-05-30 MED ORDER — FLUTICASONE PROPIONATE 50 MCG/ACT NA SUSP: 1.0000 | Freq: Every day | NASAL | 0 refills | Status: DC

## 2020-05-30 NOTE — Telephone Encounter (Signed)
Patient called in stating that she didn't renew with the [facility] like she should have and now they will not refill her Rx. Wanting to know will Dr send another Rx to the [place] so she can get her RX   fluticasone (FLONASE) 50 MCG/ACT nasal spray   Please call and advise

## 2020-05-30 NOTE — Telephone Encounter (Signed)
RX sent to mail order pharmacy, Pt notified

## 2020-07-25 ENCOUNTER — Emergency Department (HOSPITAL_BASED_OUTPATIENT_CLINIC_OR_DEPARTMENT_OTHER): Payer: Medicare Other

## 2020-07-25 ENCOUNTER — Other Ambulatory Visit: Payer: Self-pay

## 2020-07-25 ENCOUNTER — Encounter (HOSPITAL_BASED_OUTPATIENT_CLINIC_OR_DEPARTMENT_OTHER): Payer: Self-pay | Admitting: Emergency Medicine

## 2020-07-25 ENCOUNTER — Emergency Department (HOSPITAL_BASED_OUTPATIENT_CLINIC_OR_DEPARTMENT_OTHER)
Admission: EM | Admit: 2020-07-25 | Discharge: 2020-07-25 | Disposition: A | Discharge status: Home / Self Care | Payer: Medicare Other | Attending: Emergency Medicine | Admitting: Emergency Medicine

## 2020-07-25 DIAGNOSIS — R1030 Lower abdominal pain, unspecified: Secondary | ICD-10-CM | POA: Diagnosis not present

## 2020-07-25 DIAGNOSIS — Z85820 Personal history of malignant melanoma of skin: Secondary | ICD-10-CM | POA: Insufficient documentation

## 2020-07-25 DIAGNOSIS — Z87891 Personal history of nicotine dependence: Secondary | ICD-10-CM | POA: Insufficient documentation

## 2020-07-25 DIAGNOSIS — K5730 Diverticulosis of large intestine without perforation or abscess without bleeding: Secondary | ICD-10-CM | POA: Diagnosis not present

## 2020-07-25 DIAGNOSIS — K7689 Other specified diseases of liver: Secondary | ICD-10-CM | POA: Diagnosis not present

## 2020-07-25 DIAGNOSIS — R109 Unspecified abdominal pain: Secondary | ICD-10-CM

## 2020-07-25 LAB — CBC WITH DIFFERENTIAL/PLATELET
Abs Immature Granulocytes: 0.02 10*3/uL (ref 0.00–0.07)
Basophils Absolute: 0.1 10*3/uL (ref 0.0–0.1)
Basophils Relative: 1 %
Eosinophils Absolute: 0.4 10*3/uL (ref 0.0–0.5)
Eosinophils Relative: 5 %
HCT: 39.3 % (ref 36.0–46.0)
Hemoglobin: 12.9 g/dL (ref 12.0–15.0)
Immature Granulocytes: 0 %
Lymphocytes Relative: 39 %
Lymphs Abs: 3 10*3/uL (ref 0.7–4.0)
MCH: 30.6 pg (ref 26.0–34.0)
MCHC: 32.8 g/dL (ref 30.0–36.0)
MCV: 93.3 fL (ref 80.0–100.0)
Monocytes Absolute: 0.8 10*3/uL (ref 0.1–1.0)
Monocytes Relative: 10 %
Neutro Abs: 3.4 10*3/uL (ref 1.7–7.7)
Neutrophils Relative %: 45 %
Platelets: 204 10*3/uL (ref 150–400)
RBC: 4.21 MIL/uL (ref 3.87–5.11)
RDW: 12 % (ref 11.5–15.5)
WBC: 7.7 10*3/uL (ref 4.0–10.5)
nRBC: 0 % (ref 0.0–0.2)

## 2020-07-25 LAB — COMPREHENSIVE METABOLIC PANEL
ALT: 19 U/L (ref 0–44)
AST: 20 U/L (ref 15–41)
Albumin: 4 g/dL (ref 3.5–5.0)
Alkaline Phosphatase: 77 U/L (ref 38–126)
Anion gap: 9 (ref 5–15)
BUN: 17 mg/dL (ref 8–23)
CO2: 28 mmol/L (ref 22–32)
Calcium: 9.3 mg/dL (ref 8.9–10.3)
Chloride: 103 mmol/L (ref 98–111)
Creatinine, Ser: 0.64 mg/dL (ref 0.44–1.00)
GFR calc Af Amer: >60 mL/min (ref >60)
GFR calc non Af Amer: >60 mL/min (ref >60)
Glucose, Bld: 103 mg/dL — ABNORMAL HIGH (ref 70–99)
Potassium: 4.9 mmol/L (ref 3.5–5.1)
Sodium: 140 mmol/L (ref 135–145)
Total Bilirubin: 0.5 mg/dL (ref 0.3–1.2)
Total Protein: 7.1 g/dL (ref 6.5–8.1)

## 2020-07-25 LAB — URINALYSIS, ROUTINE W REFLEX MICROSCOPIC
Bilirubin Urine: NEGATIVE
Glucose, UA: NEGATIVE mg/dL
Ketones, ur: NEGATIVE mg/dL
Leukocytes,Ua: NEGATIVE
Nitrite: NEGATIVE
Protein, ur: NEGATIVE mg/dL
Specific Gravity, Urine: <1.005 — ABNORMAL LOW (ref 1.005–1.030)
pH: 6.5 (ref 5.0–8.0)

## 2020-07-25 LAB — URINALYSIS, MICROSCOPIC (REFLEX)
Squamous Epithelial / HPF: NONE SEEN (ref 0–5)
WBC, UA: NONE SEEN WBC/hpf (ref 0–5)

## 2020-07-25 LAB — LIPASE, BLOOD: Lipase: 36 U/L (ref 11–51)

## 2020-07-25 MED ORDER — IOHEXOL 300 MG/ML  SOLN
100.0000 mL | Freq: Once | INTRAMUSCULAR | Status: AC | PRN
  Administered 2020-07-25: 100 mL via INTRAVENOUS

## 2020-07-25 NOTE — ED Triage Notes (Signed)
Pain in lower abdomen.  "Swelling" all over abdomen for a few days. Diarrhea yesterday.  Denies n/v.  Denies fever.  Reports hx of diverticulitis and is concerned "because I know it can get bad."

## 2020-07-25 NOTE — Discharge Instructions (Addendum)
Your CT scan should diverticulosis but no active inflammation. There is a lesion in your liver that was seen previously in 2019 but is currently a little bit bigger in size. It is doubtful that this is the cause of your symptoms but you need to follow-up with your PCP and have an MRI to better characterize it.

## 2020-07-25 NOTE — ED Notes (Signed)
ED Provider at bedside. 

## 2020-07-25 NOTE — ED Notes (Signed)
Pt verbalized understanding of discharge instructions and denies any further questions at this time.   

## 2020-07-30 NOTE — ED Provider Notes (Signed)
[facility] EMERGENCY DEPARTMENT Provider Note   CSN: [id number] Arrival date & time: 07/25/20  0800     History Chief Complaint  Patient presents with  . Abdominal Pain    Victoria Bryant is a 74 y.o. female.  HPI  74 year old female with abdominal pain.  Onset yesterday.  She has a history of diverticulitis concerned that current symptoms may be from this again.  Some loose stools.  No blood.  No fevers or chills.  No urinary complaints.  Past Medical History:  Diagnosis Date  . Arthritis   . COLONIC POLYPS, HX OF 04/21/2007  . DEPRESSION 05/26/2009  . Diverticulitis   . Epiretinal membrane, left eye   . GERD 04/08/2008  . Glaucoma suspect of both eyes    [facility]  . Interstitial cystitis    ? per pt: was told by urologist she has this  . 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 MENOPAUSAL SYNDROME 04/21/2007  . NECK PAIN, CHRONIC 04/21/2007  . Posterior vitreous detachment of both eyes    [facility]  . Pseudophakia of both eyes    [facility]  . Unspecified disorder of refraction    both eyes    Patient Active Problem List   Diagnosis Date Noted  . Combined forms of age-related cataract of left eye 07/19/2019  . Regular astigmatism, left eye 07/19/2019  . Wheezing 12/18/2018  . Bronchitis 12/18/2018  . Interstitial cystitis   . Bilateral carpal tunnel syndrome 09/12/2018  . Pain emptying bladder 06/22/2018  . Scoliosis of lumbar region due to degenerative disease of spine in adult 05/31/2018  . Atherosclerosis of aorta ([place]) 05/31/2018  . Sciatica of right side associated with disorder of lumbar spine 05/04/2018  . Numbness and tingling of right leg 05/04/2018  . Sleep disturbance 06/30/2017  . Elevated TSH 06/09/2017  . Diverticulosis of intestine without bleeding 06/09/2017  . Prediabetes 04/14/2017  . Depression with anxiety 05/26/2009  . GERD 04/08/2008  . History of colonic polyps 04/21/2007     Past Surgical History:  Procedure Laterality Date  . COLONOSCOPY  x 2   First time polyps, second time no polyps: recall 10 yrs (next is approx 2024 per pt report)  . INCISION AND DRAINAGE ABSCESS ANAL    . POLYPECTOMY    . TONSILLECTOMY    . UPPER GASTROINTESTINAL ENDOSCOPY    . VAGINAL HYSTERECTOMY     Nonmalignant reason.  Ovaries still in.     OB History   No obstetric history on file.     Family History  Problem Relation Age of Onset  . Bladder Cancer Mother   . Lung cancer Mother   . Breast cancer Paternal Aunt   . ALS Daughter   . Fibromyalgia Sister   . Heart disease Brother   . Alcohol abuse Brother   . Colon cancer Neg Hx   . Colon polyps Neg Hx   . Esophageal cancer Neg Hx   . Rectal cancer Neg Hx   . Stomach cancer Neg Hx     Social History   Tobacco Use  . Smoking status: Former Smoker    Quit date: 12/06/1973    Years since quitting: 46.6  . Smokeless tobacco: Never Used  Vaping Use  . Vaping Use: Never used  Substance Use Topics  . Alcohol use: No  . Drug use: No    Home Medications Prior to Admission medications   Medication Sig Start Date End Date Taking?  Authorizing Provider  ALPRAZolam Duanne Moron) 0.5 MG tablet Take 1 tablet (0.5 mg total) by mouth 2 (two) times daily as needed for anxiety. 12/26/19   Kuneff, Renee A, DO  Ascorbic Acid (VITAMIN C) 1000 MG tablet Take 1,000 mg by mouth daily.    [provider]  Baclofen 5 MG TABS Take 5 mg by mouth 2 (two) times daily as needed. 11/19/19   Kuneff, Renee A, DO  Biotin w/ Vitamins C & E (HAIR/SKIN/NAILS PO) Take by mouth.    [provider]  Cyanocobalamin (VITAMIN B 12 PO) Take by mouth.    [provider]  fluticasone (FLONASE) 50 MCG/ACT nasal spray Place 1 spray into both nostrils daily. 05/30/20   Kuneff, Renee A, DO  hyoscyamine (LEVSIN SL) 0.125 MG SL tablet Place 1 tablet (0.125 mg total) under the tongue every 8 (eight) hours as needed. 12/18/18   Kuneff, Renee  A, DO  meloxicam (MOBIC) 15 MG tablet Take 1 tablet (15 mg total) by mouth daily. 12/26/19   Kuneff, Renee A, DO  Nutritional Supplements (JUICE PLUS FIBRE PO) Take by mouth.    [provider]  pantoprazole (PROTONIX) 40 MG tablet Take 1 tablet (40 mg total) by mouth daily. 11/19/19   Kuneff, Renee A, DO  Probiotic Product (RA PROBIOTIC GUMMIES PO) Take by mouth.    [provider]  tolterodine (DETROL LA) 4 MG 24 hr capsule Take 1 capsule (4 mg total) by mouth daily as needed. 11/19/19   Kuneff, Renee A, DO  TURMERIC PO Take by mouth.    [provider]  Vitamin D, Cholecalciferol, 25 MCG (1000 UT) CAPS Take 2 capsules by mouth daily.    [provider]  Zinc 50 MG TABS Take by mouth.    [provider]    Allergies    Naproxen, Paroxetine, Clarithromycin, Doxycycline hyclate, Erythromycin ethylsuccinate, and Lexapro [escitalopram oxalate]  Review of Systems   Review of Systems All systems reviewed and negative, other than as noted in HPI.  Physical Exam Updated Vital Signs BP 140/69 (BP Location: Right Arm)   Pulse 65   Temp 97.9 F (36.6 C) (Oral)   Resp 16   Ht 5\' 3"  (1.6 m)   Wt 69.1 kg   SpO2 100%   BMI 26.98 kg/m   Physical Exam Vitals and nursing note reviewed.  Constitutional:      General: She is not in acute distress.    Appearance: She is well-developed.  HENT:     Head: Normocephalic and atraumatic.  Eyes:     General:        Right eye: No discharge.        Left eye: No discharge.     Conjunctiva/sclera: Conjunctivae normal.  Cardiovascular:     Rate and Rhythm: Normal rate and regular rhythm.     Heart sounds: Normal heart sounds. No murmur heard.  No friction rub. No gallop.   Pulmonary:     Effort: Pulmonary effort is normal. No respiratory distress.     Breath sounds: Normal breath sounds.  Abdominal:     General: There is no distension.     Palpations: Abdomen is soft.     Tenderness: There is  abdominal tenderness.     Comments: .Left-sided abdominal tenderness no rebound or guarding.  No distention.  No CVA tenderness.   Musculoskeletal:        General: No tenderness.     Cervical back: Neck supple.  Skin:  General: Skin is warm and dry.  Neurological:     Mental Status: She is alert.  Psychiatric:        Behavior: Behavior normal.        Thought Content: Thought content normal.     ED Results / Procedures / Treatments   Labs (all labs ordered are listed, but only abnormal results are displayed) Labs Reviewed  URINALYSIS, ROUTINE W REFLEX MICROSCOPIC - Abnormal; Notable for the following components:      Result Value   Color, Urine STRAW (*)    Specific Gravity, Urine <1.005 (*)    Hgb urine dipstick TRACE (*)    All other components within normal limits  COMPREHENSIVE METABOLIC PANEL - Abnormal; Notable for the following components:   Glucose, Bld 103 (*)    All other components within normal limits  URINALYSIS, MICROSCOPIC (REFLEX) - Abnormal; Notable for the following components:   Bacteria, UA FEW (*)    All other components within normal limits  CBC WITH DIFFERENTIAL/PLATELET  LIPASE, BLOOD    EKG None  Radiology No results found.   CT ABDOMEN PELVIS W CONTRAST  Result Date: 07/25/2020 CLINICAL DATA:  Suspected diverticulitis. Lower abdominal pain and diarrhea with history of diverticulitis EXAM: CT ABDOMEN AND PELVIS WITH CONTRAST TECHNIQUE: Multidetector CT imaging of the abdomen and pelvis was performed using the standard protocol following bolus administration of intravenous contrast. CONTRAST:  137mL OMNIPAQUE IOHEXOL 300 MG/ML  SOLN COMPARISON:  10/27/2018 FINDINGS: Lower chest: Incidental imaging of the lung bases is unremarkable. No consolidation. No pleural effusion. Hepatobiliary: Lesion in the posterior RIGHT hemi liver measures approximately 2.0 by 1.4 cm, previously approximately 1.5 x 0.9 cm. Suggestion of some peripheral and nodular  enhancement. Signs of hepatic steatosis with mildly lobular hepatic contour. The no pericholecystic stranding. No biliary duct dilation. Portal vein is patent. Pancreas: Pancreas is normal without ductal dilation or inflammation. Spleen: Spleen normal in size and contour. Adrenals/Urinary Tract: Adrenal glands are normal. Symmetric renal enhancement. No hydronephrosis. Urinary bladder is normal. Stomach/Bowel: Stomach is unremarkable. Small bowel without acute process. Normal appendix. No acute colonic abnormality. Diverticulosis. Vascular/Lymphatic: Calcified and noncalcified atheromatous plaque of the abdominal aorta without aneurysmal dilation. No adenopathy in the retroperitoneum. No adenopathy in the upper abdomen. Reproductive: Post hysterectomy. No adnexal mass or pelvic adenopathy. Other: Small fat containing umbilical hernia. No free air. No ascites. Musculoskeletal: Spinal degenerative changes with lateral listhesis of L2 on L3 approximately 1 cm similar to the prior study but with worse degenerative change than on the previous exam. No acute bone finding. No destructive bone process. IMPRESSION: 1. No acute findings in the abdomen or pelvis. 2. Diverticulosis without evidence of acute diverticulitis. 3. Lesion in the posterior RIGHT hemi liver measures approximately 2.0 by 1.4 cm, previously approximately 1.5 x 0.9 cm. Suggestion of some peripheral and nodular enhancement. This is likely a hemangioma. However, given the interval growth and lack of definitive imaging features on today's an on prior examinations, consider follow-up with MRI for further assessment. 4. Spinal degenerative changes worse than on the prior study. 5. Aortic atherosclerosis. Aortic Atherosclerosis (ICD10-I70.0). Electronically Signed   By: Zetta Bills M.D.   On: 07/25/2020 10:55    Procedures Procedures (including critical care time)  Medications Ordered in ED Medications  iohexol (OMNIPAQUE) 300 MG/ML solution 100 mL  (100 mLs Intravenous Contrast Given 07/25/20 1030)    ED Course  I have reviewed the triage vital signs and the nursing notes.  Pertinent labs &  imaging results that were available during my care of the patient were reviewed by me and considered in my medical decision making (see chart for details).    MDM Rules/Calculators/A&P                         It has been determined that no acute conditions requiring further emergency intervention are present at this time. The patient has been advised of the diagnosis and plan. I reviewed any labs and imaging including any potential incidental findings. I have reviewed nursing notes and appropriate previous records. We have discussed signs and symptoms that warrant return to the ED and they are listed in the discharge instructions.       Final Clinical Impression(s) / ED Diagnoses Final diagnoses:  Abdominal pain, unspecified abdominal location    Rx / DC Orders ED Discharge Orders    None       Virgel Manifold, MD 07/30/20 1034

## 2020-07-31 ENCOUNTER — Telehealth: Payer: Medicare Other | Admitting: Family Medicine

## 2020-07-31 ENCOUNTER — Other Ambulatory Visit: Payer: Self-pay

## 2020-07-31 DIAGNOSIS — K769 Liver disease, unspecified: Secondary | ICD-10-CM

## 2020-08-01 ENCOUNTER — Ambulatory Visit (INDEPENDENT_AMBULATORY_CARE_PROVIDER_SITE_OTHER): Payer: Medicare Other | Admitting: Family Medicine

## 2020-08-01 ENCOUNTER — Encounter: Payer: Self-pay | Admitting: Family Medicine

## 2020-08-01 VITALS — BP 138/76 | HR 73 | Temp 97.9°F | Ht 63.0 in | Wt 154.0 lb

## 2020-08-01 DIAGNOSIS — Z85820 Personal history of malignant melanoma of skin: Secondary | ICD-10-CM

## 2020-08-01 DIAGNOSIS — K769 Liver disease, unspecified: Secondary | ICD-10-CM | POA: Diagnosis not present

## 2020-08-01 NOTE — Patient Instructions (Addendum)
I will order your MRI of liver and they will call you to schedule.  We will try to get it at [facility].   We will call you with results and discuss if any further evaluation is needed.    Turn on or allow access to your phone microphone and camera will allow [place] to do video visits in the future.   Schedule physcial after 12/14. Thanks.

## 2020-08-01 NOTE — Progress Notes (Signed)
This visit occurred during the SARS-CoV-2 public health emergency.  Safety protocols were in place, including screening questions prior to the visit, additional usage of staff PPE, and extensive cleaning of exam room while observing appropriate contact time as indicated for disinfecting solutions.    Victoria Bryant , [birth year], 74 y.o., female MRN: [id number] Patient Care Team    Relationship Specialty Notifications Start End  [name], DO PCP - General Family Medicine  06/18/16   [name], MD Consulting Physician Obstetrics and Gynecology  10/06/16   [name], MD (Inactive) Consulting Physician Gastroenterology  10/08/16   Ladene Artist, MD Consulting Physician Gastroenterology  10/24/18   Lyndal Pulley, DO Consulting Physician Family Medicine  10/24/18   Allyn Kenner, MD  Dermatology  10/24/18     Chief Complaint  Patient presents with  . Results    CT     Subjective: Pt presents for an OV to discuss incidental finding of liver lesion on CT abdomen pelvis completed in the emergency room on 07/25/2020 for abdominal pain.  At that time patient was complaining of right lower quadrant abdominal pain and seen in the ED for concerns over a diverticular flare which she has a history of.  CT imaging of the abdomen was completed and resulted with a lesion in the posterior right hemiliver measures approximately 2.0 x 1.4 cm.  This was compared to a previous CT 10/27/2018 where it was now visualized that the liver lesion was 1.5 x 0.9 cm.  Of note this was not recorded on original CT report.  Patient does have a remote history of melanoma.  Her mammogram is up-to-date as is her colonoscopy.  There is also a small fat-containing umbilical hernia as well as some mildly worsening degenerative change of her lumbar spine.  Hepatitis screenings have been negative. She does endorse complete resolution of the right lower quadrant pain she was experiencing during her ED  visit.   Depression screen [facility] 2/9 11/19/2019 10/24/2018 03/15/2018 11/21/2017 10/21/2017  Decreased Interest 0 0 0 0 1  Down, Depressed, Hopeless 0 0 0 0 1  PHQ - 2 Score 0 0 0 0 2  Altered sleeping - 0 - 1 0  Tired, decreased energy - 0 - 0 2  Change in appetite - 0 - 0 0  Feeling bad or failure about yourself  - 0 - 0 0  Trouble concentrating - 0 - 0 1  Moving slowly or fidgety/restless - 0 - 0 0  Suicidal thoughts - 0 - 0 0  PHQ-9 Score - 0 - 1 5  Difficult doing work/chores - Not difficult at all - - Not difficult at all    Allergies  Allergen Reactions  . Naproxen Diarrhea  . Paroxetine     REACTION: unspecified  . Clarithromycin Nausea Only  . Doxycycline Hyclate Nausea And Vomiting  . Erythromycin Ethylsuccinate Nausea Only  . Lexapro [Escitalopram Oxalate] Other (See Comments)    Paresthesia    Social History   Social History Narrative   Married, one daughter lives next door to her.   Retired Art gallery manager.   Tob: 20 pack yr hx, quit age 20.   No alc.   Past Medical History:  Diagnosis Date  . Arthritis   . COLONIC POLYPS, HX OF 04/21/2007  . DEPRESSION 05/26/2009  . Diverticulitis   . Epiretinal membrane, left eye   . GERD 04/08/2008  . Glaucoma suspect of both eyes  [facility]  . Interstitial cystitis    ? per pt: was told by urologist she has this  . 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 MENOPAUSAL SYNDROME 04/21/2007  . NECK PAIN, CHRONIC 04/21/2007  . Posterior vitreous detachment of both eyes    [facility]  . Pseudophakia of both eyes    [facility]  . Unspecified disorder of refraction    both eyes   Past Surgical History:  Procedure Laterality Date  . COLONOSCOPY  x 2   First time polyps, second time no polyps: recall 10 yrs (next is approx 2024 per pt report)  . INCISION AND DRAINAGE ABSCESS ANAL    . POLYPECTOMY    . TONSILLECTOMY    . UPPER GASTROINTESTINAL ENDOSCOPY    .  VAGINAL HYSTERECTOMY     Nonmalignant reason.  Ovaries still in.   Family History  Problem Relation Age of Onset  . Bladder Cancer Mother   . Lung cancer Mother   . Breast cancer Paternal Aunt   . ALS Daughter   . Fibromyalgia Sister   . Heart disease Brother   . Alcohol abuse Brother   . Colon cancer Neg Hx   . Colon polyps Neg Hx   . Esophageal cancer Neg Hx   . Rectal cancer Neg Hx   . Stomach cancer Neg Hx    Allergies as of 08/01/2020      Reactions   Naproxen Diarrhea   Paroxetine    REACTION: unspecified   Clarithromycin Nausea Only   Doxycycline Hyclate Nausea And Vomiting   Erythromycin Ethylsuccinate Nausea Only   Lexapro [escitalopram Oxalate] Other (See Comments)   Paresthesia      Medication List       Accurate as of August 01, 2020  6:10 PM. If you have any questions, ask your nurse or doctor.        ALPRAZolam 0.5 MG tablet Commonly known as: Xanax Take 1 tablet (0.5 mg total) by mouth 2 (two) times daily as needed for anxiety.   Baclofen 5 MG Tabs Take 5 mg by mouth 2 (two) times daily as needed.   fluticasone 50 MCG/ACT nasal spray Commonly known as: FLONASE Place 1 spray into both nostrils daily.   HAIR/SKIN/NAILS PO Take by mouth.   hyoscyamine 0.125 MG SL tablet Commonly known as: LEVSIN SL Place 1 tablet (0.125 mg total) under the tongue every 8 (eight) hours as needed.   JUICE PLUS FIBRE PO Take by mouth.   meloxicam 15 MG tablet Commonly known as: MOBIC Take 1 tablet (15 mg total) by mouth daily.   pantoprazole 40 MG tablet Commonly known as: PROTONIX Take 1 tablet (40 mg total) by mouth daily.   RA PROBIOTIC GUMMIES PO Take by mouth.   tolterodine 4 MG 24 hr capsule Commonly known as: DETROL LA Take 1 capsule (4 mg total) by mouth daily as needed.   TURMERIC PO Take by mouth.   VITAMIN B 12 PO Take by mouth.   vitamin C 1000 MG tablet Take 1,000 mg by mouth daily.   Vitamin D (Cholecalciferol) 25 MCG (1000 UT)  Caps Take 2 capsules by mouth daily.   Zinc 50 MG Tabs Take by mouth.       All past medical history, surgical history, allergies, family history, immunizations andmedications were updated in the EMR today and reviewed under the history and medication portions of their EMR.     ROS: Negative, with the exception of  above mentioned in HPI   Objective:  BP 138/76 (BP Location: Left Arm, Patient Position: Sitting, Cuff Size: Normal)   Pulse 73   Temp 97.9 F (36.6 C) (Oral)   Ht 5\' 3"  (1.6 m)   Wt 154 lb (69.9 kg)   SpO2 97%   BMI 27.28 kg/m  Body mass index is 27.28 kg/m. Gen: Afebrile. No acute distress. Nontoxic in appearance, well developed, well nourished.  HENT: AT. [place].  Eyes:Pupils Equal Round Reactive to light, Extraocular movements intact,  Conjunctiva without redness, discharge or icterus. Neck/lymp/endocrine: Supple,no lymphadenopathy CV: RRR  Chest: CTAB, no wheeze or crackles. Good air movement, normal resp effort.  Abd: Soft.  Flat. NTND. BS present.  No masses palpated. No rebound or guarding.  Skin: no rashes, purpura or petechiae.  Neuro:  Normal gait. PERLA. EOMi. Alert. Oriented x3  Psych: Normal affect, dress and demeanor. Normal speech. Normal thought content and judgment.  No exam data present No results found. No results found for this or any previous visit (from the past 24 hour(s)).  Assessment/Plan: SIAN ROCKERS is a 74 y.o. female present for OV for  Liver lesion, right lobe/H/O Malignant melanoma Discuss incidental finding with her today.  She is very concerned over cancer as a potential diagnosis.  She has had a history of melanoma.  Otherwise her colonoscopies and mammograms have been up-to-date.  There has been a rather significant rapid growth in 2 years per CT report 07/25/2020. Patient would like to move forward with MRI of her liver for better evaluation of liver lesion and rule out malignancy - MR LIVER W WO CONTRAST;  Future   Reviewed expectations re: course of current medical issues.  Discussed self-management of symptoms.  Outlined signs and symptoms indicating need for more acute intervention.  Patient verbalized understanding and all questions were answered.  Patient received an After-Visit Summary.    Orders Placed This Encounter  Procedures  . MR LIVER W WO CONTRAST   No orders of the defined types were placed in this encounter.  Referral Orders  No referral(s) requested today   > 30 Minutes was dedicated to this patient's encounter to include pre-visit review of chart, face-to-face time with patient and post-visit work- which include documentation and prescribing medications and/or ordering test when necessary.     Note is dictated utilizing voice recognition software. Although note has been proof read prior to signing, occasional typographical errors still can be missed. If any questions arise, please do not hesitate to call for verification.   electronically signed by:  Howard Pouch, DO  [place]

## 2020-08-01 NOTE — Progress Notes (Signed)
Patient unable to log onto virtual visit.  Rescheduled for in person visit

## 2020-08-09 ENCOUNTER — Other Ambulatory Visit: Payer: Self-pay

## 2020-08-09 ENCOUNTER — Ambulatory Visit (HOSPITAL_BASED_OUTPATIENT_CLINIC_OR_DEPARTMENT_OTHER)
Admission: RE | Admit: 2020-08-09 | Discharge: 2020-08-09 | Disposition: A | Discharge status: Home / Self Care | Payer: Medicare Other | Source: Ambulatory Visit | Attending: Family Medicine | Admitting: Family Medicine

## 2020-08-09 DIAGNOSIS — K769 Liver disease, unspecified: Secondary | ICD-10-CM

## 2020-08-09 DIAGNOSIS — Z85820 Personal history of malignant melanoma of skin: Secondary | ICD-10-CM | POA: Insufficient documentation

## 2020-08-09 DIAGNOSIS — K7689 Other specified diseases of liver: Secondary | ICD-10-CM | POA: Diagnosis not present

## 2020-08-09 DIAGNOSIS — K760 Fatty (change of) liver, not elsewhere classified: Secondary | ICD-10-CM | POA: Diagnosis not present

## 2020-08-09 MED ORDER — GADOBUTROL 1 MMOL/ML IV SOLN
7.0000 mL | Freq: Once | INTRAVENOUS | Status: AC | PRN
  Administered 2020-08-09: 7 mL via INTRAVENOUS

## 2020-08-18 ENCOUNTER — Telehealth: Payer: Self-pay

## 2020-08-18 MED ORDER — PREDNISONE 20 MG PO TABS: ORAL_TABLET | ORAL | 0 refills | Status: DC

## 2020-08-18 NOTE — Telephone Encounter (Signed)
Patient requests steroid pack for pain. She has an appointment Friday 08/22/20.

## 2020-08-18 NOTE — Telephone Encounter (Signed)
I did go ahead and call in prednisone taper for her to start for her back pain.  Keep Friday's appointment for evaluation.

## 2020-08-19 NOTE — Telephone Encounter (Signed)
Patient aware of rx and she will be here on Friday.

## 2020-08-20 ENCOUNTER — Telehealth: Payer: Self-pay | Admitting: Family Medicine

## 2020-08-20 NOTE — Telephone Encounter (Signed)
Patient states she has been taking prednisone x 2 days and now has headaches and flushed face, so did not take it today. Triage nurse instructed her to be seen in the next 24 hours. She wants to know if Dr. Raoul Pitch wants to see her or just discontinue prednisone.

## 2020-08-20 NOTE — Telephone Encounter (Signed)
Unfortunately, I have not seen the patient for this flare.  I called in a courtesy script  of the prednisone for her back pain since her appointment was not until Friday.  She has tolerated prednisone at this dose many times in the past without any side effects.  Therefore it is less likely the prednisone is the cause of her headache/flushing, but of course we can say that for certain. Would be very difficult to guide her on what to do next without having seen her. For now, she can continue the prednisone taper since by day 3 the dose is already lowering to 2 tabs or she can take one tab a day (her choice).   I would Encouraged her to get the rapid test for Covid and we will see her Friday to guide her further, assuming covid test is negative. If positive will need to perform virtual visit.

## 2020-08-20 NOTE — Telephone Encounter (Signed)
Patient advised and voiced understanding. She had a rapid covid test already and it was negative. Advised to continue with provider appt on Friday, 9/17.

## 2020-08-20 NOTE — Telephone Encounter (Signed)
Patient states her headache has gotten some better, she states she took 1 prednisone tablet today instead of the 3 tablets ( she spoke to the pharmacist and he states that a lower dose of prednisone would dull her headaches ).  Patient states her flushed face.  Patient is now going to take a covid test to make sure she isn't having face flushing due to Covid.   She states she is getting a rapid test and she will let us know the results.  She is wanting to know what you think she should do?  Her sciatic pain is not really any better.   She does still have appointment scheduled Friday.

## 2020-08-22 ENCOUNTER — Encounter: Payer: Self-pay | Admitting: Family Medicine

## 2020-08-22 ENCOUNTER — Ambulatory Visit (INDEPENDENT_AMBULATORY_CARE_PROVIDER_SITE_OTHER): Payer: Medicare Other | Admitting: Family Medicine

## 2020-08-22 ENCOUNTER — Other Ambulatory Visit: Payer: Self-pay

## 2020-08-22 VITALS — BP 153/69 | HR 65 | Temp 98.3°F | Ht 63.0 in | Wt 154.6 lb

## 2020-08-22 DIAGNOSIS — M5386 Other specified dorsopathies, lumbar region: Secondary | ICD-10-CM

## 2020-08-22 DIAGNOSIS — D1803 Hemangioma of intra-abdominal structures: Secondary | ICD-10-CM | POA: Diagnosis not present

## 2020-08-22 DIAGNOSIS — Z23 Encounter for immunization: Secondary | ICD-10-CM

## 2020-08-22 DIAGNOSIS — I700 Atherosclerosis of aorta: Secondary | ICD-10-CM | POA: Diagnosis not present

## 2020-08-22 MED ORDER — BACLOFEN 5 MG PO TABS: 5.0000 mg | ORAL_TABLET | Freq: Two times a day (BID) | ORAL | 1 refills | Status: DC | PRN

## 2020-08-22 MED ORDER — GABAPENTIN 100 MG PO CAPS
100.0000 mg | ORAL_CAPSULE | Freq: Three times a day (TID) | ORAL | 1 refills | Status: DC
Start: 2020-08-22 — End: 2020-12-15

## 2020-08-22 NOTE — Patient Instructions (Addendum)
Restart baclofen and mobic as prescribed.  Continue gabapentin every 8 hours.   Heat/stretches.   If not improving or if worsening> follow up and we may need to consider image (MRI) of spine.

## 2020-08-22 NOTE — Progress Notes (Signed)
This visit occurred during the SARS-CoV-2 public health emergency.  Safety protocols were in place, including screening questions prior to the visit, additional usage of staff PPE, and extensive cleaning of exam room while observing appropriate contact time as indicated for disinfecting solutions.    Victoria Bryant , Oct 11, 1946, 74 y.o., female MRN: [id number] Patient Care Team    Relationship Specialty Notifications Start End  [name], DO PCP - General Family Medicine  06/18/16   [name], MD Consulting Physician Obstetrics and Gynecology  10/06/16   [name], MD (Inactive) Consulting Physician Gastroenterology  10/08/16   Ladene Artist, MD Consulting Physician Gastroenterology  10/24/18   Lyndal Pulley, DO Consulting Physician Family Medicine  10/24/18   Allyn Kenner, MD  Dermatology  10/24/18     Chief Complaint  Patient presents with  . liver lesion    [facility]     Subjective: Pt presents for an OV to discuss follow MRI results on her liver lesion.  Patient reports she does feel better now that the MRI has been completed.  Her concerns were not over the hemangioma, and more so over the aortic atherosclerosis.  Sciatica right leg with lumbar pain:  Patient reports having right lower buttock pain.  She states it is a little different than her prior sciatic pain.  She was provided with a prednisone taper prior to appointment which has not been as effective for her as usual.  She has not been taking her baclofen or her Mobic.  She is taking gabapentin. Prior note: She states that the tingling sensation has improved with the prednisone taper.  She is tolerating gabapentin 100 mg twice daily.  Also tolerating the Mobic 15 mg daily.  She continues to have a muscle weakness/discomfort of her right medial lateral thigh.  X-rays with her today which showed Prominent S-shaped scoliosis lumbar spine. Diffuse multilevel degenerative change, particular at L1-L2. 5 mm  retrolisthesis L1 onL2. No acute bony abnormality.  She reports she wants to try OMT/sports med doctor and try to avoid any surgical procedures.  SHe was referred to physical therapy, they have not contacted her yet.  Depression screen [facility] 2/9 11/19/2019 10/24/2018 03/15/2018 11/21/2017 10/21/2017  Decreased Interest 0 0 0 0 1  Down, Depressed, Hopeless 0 0 0 0 1  PHQ - 2 Score 0 0 0 0 2  Altered sleeping - 0 - 1 0  Tired, decreased energy - 0 - 0 2  Change in appetite - 0 - 0 0  Feeling bad or failure about yourself  - 0 - 0 0  Trouble concentrating - 0 - 0 1  Moving slowly or fidgety/restless - 0 - 0 0  Suicidal thoughts - 0 - 0 0  PHQ-9 Score - 0 - 1 5  Difficult doing work/chores - Not difficult at all - - Not difficult at all    Allergies  Allergen Reactions  . Naproxen Diarrhea  . Paroxetine     REACTION: unspecified  . Clarithromycin Nausea Only  . Doxycycline Hyclate Nausea And Vomiting  . Erythromycin Ethylsuccinate Nausea Only  . Lexapro [Escitalopram Oxalate] Other (See Comments)    Paresthesia    Social History   Social History Narrative   Married, one daughter lives next door to her.   Retired Art gallery manager.   Tob: 20 pack yr hx, quit age 82.   No alc.   Past Medical History:  Diagnosis Date  . Arthritis   .  COLONIC POLYPS, HX OF 04/21/2007  . DEPRESSION 05/26/2009  . Diverticulitis   . Epiretinal membrane, left eye   . GERD 04/08/2008  . Glaucoma suspect of both eyes    [facility]  . Interstitial cystitis    ? per pt: was told by urologist she has this  . 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 MENOPAUSAL SYNDROME 04/21/2007  . NECK PAIN, CHRONIC 04/21/2007  . Posterior vitreous detachment of both eyes    [facility]  . Pseudophakia of both eyes    [facility]  . Unspecified disorder of refraction    both eyes   Past Surgical History:  Procedure Laterality Date  . COLONOSCOPY  x 2   First  time polyps, second time no polyps: recall 10 yrs (next is approx 2024 per pt report)  . INCISION AND DRAINAGE ABSCESS ANAL    . POLYPECTOMY    . TONSILLECTOMY    . UPPER GASTROINTESTINAL ENDOSCOPY    . VAGINAL HYSTERECTOMY     Nonmalignant reason.  Ovaries still in.   Family History  Problem Relation Age of Onset  . Bladder Cancer Mother   . Lung cancer Mother   . Breast cancer Paternal Aunt   . ALS Daughter   . Fibromyalgia Sister   . Heart disease Brother   . Alcohol abuse Brother   . Colon cancer Neg Hx   . Colon polyps Neg Hx   . Esophageal cancer Neg Hx   . Rectal cancer Neg Hx   . Stomach cancer Neg Hx    Allergies as of 08/22/2020      Reactions   Naproxen Diarrhea   Paroxetine    REACTION: unspecified   Clarithromycin Nausea Only   Doxycycline Hyclate Nausea And Vomiting   Erythromycin Ethylsuccinate Nausea Only   Lexapro [escitalopram Oxalate] Other (See Comments)   Paresthesia      Medication List       Accurate as of August 22, 2020 11:00 AM. If you have any questions, ask your nurse or doctor.        ALPRAZolam 0.5 MG tablet Commonly known as: Xanax Take 1 tablet (0.5 mg total) by mouth 2 (two) times daily as needed for anxiety.   Baclofen 5 MG Tabs Take 5 mg by mouth 2 (two) times daily as needed.   fluticasone 50 MCG/ACT nasal spray Commonly known as: FLONASE Place 1 spray into both nostrils daily.   gabapentin 100 MG capsule Commonly known as: NEURONTIN Take 1 capsule (100 mg total) by mouth 3 (three) times daily.   HAIR/SKIN/NAILS PO Take by mouth.   hyoscyamine 0.125 MG SL tablet Commonly known as: LEVSIN SL Place 1 tablet (0.125 mg total) under the tongue every 8 (eight) hours as needed.   JUICE PLUS FIBRE PO Take by mouth.   meloxicam 15 MG tablet Commonly known as: MOBIC Take 1 tablet (15 mg total) by mouth daily.   pantoprazole 40 MG tablet Commonly known as: PROTONIX Take 1 tablet (40 mg total) by mouth daily.     predniSONE 20 MG tablet Commonly known as: DELTASONE 60 mg x3d, 40 mg x3d, 20 mg x2d, 10 mg x2d   RA PROBIOTIC GUMMIES PO Take by mouth.   tolterodine 4 MG 24 hr capsule Commonly known as: DETROL LA Take 1 capsule (4 mg total) by mouth daily as needed.   TURMERIC PO Take by mouth.   VITAMIN B 12 PO Take by mouth.   vitamin C  1000 MG tablet Take 1,000 mg by mouth daily.   Vitamin D (Cholecalciferol) 25 MCG (1000 UT) Caps Take 2 capsules by mouth daily.   Zinc 50 MG Tabs Take by mouth.       All past medical history, surgical history, allergies, family history, immunizations andmedications were updated in the EMR today and reviewed under the history and medication portions of their EMR.     ROS: Negative, with the exception of above mentioned in HPI   Objective:  BP (!) 153/69   Pulse 65   Temp 98.3 F (36.8 C) (Oral)   Ht 5\' 3"  (1.6 m)   Wt 154 lb 9.6 oz (70.1 kg)   SpO2 97%   BMI 27.39 kg/m  Body mass index is 27.39 kg/m. Gen: Afebrile. No acute distress.  HENT: AT. [place].  Eyes:Pupils Equal Round Reactive to light, Extraocular movements intact,  Conjunctiva without redness, discharge or icterus. MSK: No erythema or bony tenderness of the lumbar spine.  Tenderness over right piriformis.  Normal range of motion lumbar spine and lower extremities.  Neurovascularly intact distally. Neuro: Normal gait. PERLA. EOMi. Alert. Oriented x3   No exam data present No results found. No results found for this or any previous visit (from the past 24 hour(s)).  Assessment/Plan: Victoria Bryant is a 74 y.o. female present for OV for  Liver hemangioma/aortic atherosclerosis -Both diagnosis and MRI in detail with patient today.  She was appreciative and understands results.  Sciatica of right side associated with disorder of lumbar spine Discussed sciatica and piriformis syndrome with her today.  She was encouraged to perform stretches which were explained to her  today. Heat application is helpful. Restart baclofen twice daily. Restart Mobic daily. Follow-up as needed  Influenza vaccine administered today.   Reviewed expectations re: course of current medical issues.  Discussed self-management of symptoms.  Outlined signs and symptoms indicating need for more acute intervention.  Patient verbalized understanding and all questions were answered.  Patient received an After-Visit Summary.    Orders Placed This Encounter  Procedures  . Flu vaccine HIGH DOSE PF (Fluzone High dose)  . Tdap vaccine greater than or equal to 7yo IM   Meds ordered this encounter  Medications  . Baclofen 5 MG TABS    Sig: Take 5 mg by mouth 2 (two) times daily as needed.    Dispense:  180 tablet    Refill:  1  . gabapentin (NEURONTIN) 100 MG capsule    Sig: Take 1 capsule (100 mg total) by mouth 3 (three) times daily.    Dispense:  270 capsule    Refill:  1   Referral Orders  No referral(s) requested today       Note is dictated utilizing voice recognition software. Although note has been proof read prior to signing, occasional typographical errors still can be missed. If any questions arise, please do not hesitate to call for verification.   electronically signed by:  Howard Pouch, DO  [place]

## 2020-08-27 DIAGNOSIS — D1803 Hemangioma of intra-abdominal structures: Secondary | ICD-10-CM

## 2020-08-27 DIAGNOSIS — I700 Atherosclerosis of aorta: Secondary | ICD-10-CM | POA: Insufficient documentation

## 2020-08-27 HISTORY — DX: Hemangioma of intra-abdominal structures: D18.03

## 2020-09-19 DIAGNOSIS — M5417 Radiculopathy, lumbosacral region: Secondary | ICD-10-CM | POA: Diagnosis not present

## 2020-09-19 DIAGNOSIS — M9904 Segmental and somatic dysfunction of sacral region: Secondary | ICD-10-CM | POA: Diagnosis not present

## 2020-09-19 DIAGNOSIS — M9902 Segmental and somatic dysfunction of thoracic region: Secondary | ICD-10-CM | POA: Diagnosis not present

## 2020-09-19 DIAGNOSIS — M9903 Segmental and somatic dysfunction of lumbar region: Secondary | ICD-10-CM | POA: Diagnosis not present

## 2020-09-19 DIAGNOSIS — M5136 Other intervertebral disc degeneration, lumbar region: Secondary | ICD-10-CM | POA: Diagnosis not present

## 2020-09-22 DIAGNOSIS — M5136 Other intervertebral disc degeneration, lumbar region: Secondary | ICD-10-CM | POA: Diagnosis not present

## 2020-09-22 DIAGNOSIS — M9902 Segmental and somatic dysfunction of thoracic region: Secondary | ICD-10-CM | POA: Diagnosis not present

## 2020-09-22 DIAGNOSIS — M9904 Segmental and somatic dysfunction of sacral region: Secondary | ICD-10-CM | POA: Diagnosis not present

## 2020-09-22 DIAGNOSIS — M5417 Radiculopathy, lumbosacral region: Secondary | ICD-10-CM | POA: Diagnosis not present

## 2020-09-22 DIAGNOSIS — M9903 Segmental and somatic dysfunction of lumbar region: Secondary | ICD-10-CM | POA: Diagnosis not present

## 2020-09-25 DIAGNOSIS — M9903 Segmental and somatic dysfunction of lumbar region: Secondary | ICD-10-CM | POA: Diagnosis not present

## 2020-09-25 DIAGNOSIS — M5136 Other intervertebral disc degeneration, lumbar region: Secondary | ICD-10-CM | POA: Diagnosis not present

## 2020-09-25 DIAGNOSIS — M9902 Segmental and somatic dysfunction of thoracic region: Secondary | ICD-10-CM | POA: Diagnosis not present

## 2020-09-25 DIAGNOSIS — M9904 Segmental and somatic dysfunction of sacral region: Secondary | ICD-10-CM | POA: Diagnosis not present

## 2020-09-25 DIAGNOSIS — M5417 Radiculopathy, lumbosacral region: Secondary | ICD-10-CM | POA: Diagnosis not present

## 2020-09-29 DIAGNOSIS — M5417 Radiculopathy, lumbosacral region: Secondary | ICD-10-CM | POA: Diagnosis not present

## 2020-09-29 DIAGNOSIS — M9904 Segmental and somatic dysfunction of sacral region: Secondary | ICD-10-CM | POA: Diagnosis not present

## 2020-09-29 DIAGNOSIS — M5136 Other intervertebral disc degeneration, lumbar region: Secondary | ICD-10-CM | POA: Diagnosis not present

## 2020-09-29 DIAGNOSIS — M9903 Segmental and somatic dysfunction of lumbar region: Secondary | ICD-10-CM | POA: Diagnosis not present

## 2020-09-29 DIAGNOSIS — M9902 Segmental and somatic dysfunction of thoracic region: Secondary | ICD-10-CM | POA: Diagnosis not present

## 2020-10-02 DIAGNOSIS — M9902 Segmental and somatic dysfunction of thoracic region: Secondary | ICD-10-CM | POA: Diagnosis not present

## 2020-10-02 DIAGNOSIS — M9903 Segmental and somatic dysfunction of lumbar region: Secondary | ICD-10-CM | POA: Diagnosis not present

## 2020-10-02 DIAGNOSIS — M5136 Other intervertebral disc degeneration, lumbar region: Secondary | ICD-10-CM | POA: Diagnosis not present

## 2020-10-02 DIAGNOSIS — M5417 Radiculopathy, lumbosacral region: Secondary | ICD-10-CM | POA: Diagnosis not present

## 2020-10-02 DIAGNOSIS — M9904 Segmental and somatic dysfunction of sacral region: Secondary | ICD-10-CM | POA: Diagnosis not present

## 2020-10-06 DIAGNOSIS — M9902 Segmental and somatic dysfunction of thoracic region: Secondary | ICD-10-CM | POA: Diagnosis not present

## 2020-10-06 DIAGNOSIS — M9904 Segmental and somatic dysfunction of sacral region: Secondary | ICD-10-CM | POA: Diagnosis not present

## 2020-10-06 DIAGNOSIS — M5417 Radiculopathy, lumbosacral region: Secondary | ICD-10-CM | POA: Diagnosis not present

## 2020-10-06 DIAGNOSIS — M9903 Segmental and somatic dysfunction of lumbar region: Secondary | ICD-10-CM | POA: Diagnosis not present

## 2020-10-06 DIAGNOSIS — M5136 Other intervertebral disc degeneration, lumbar region: Secondary | ICD-10-CM | POA: Diagnosis not present

## 2020-10-07 DIAGNOSIS — J069 Acute upper respiratory infection, unspecified: Secondary | ICD-10-CM | POA: Diagnosis not present

## 2020-10-09 DIAGNOSIS — M5136 Other intervertebral disc degeneration, lumbar region: Secondary | ICD-10-CM | POA: Diagnosis not present

## 2020-10-09 DIAGNOSIS — M9903 Segmental and somatic dysfunction of lumbar region: Secondary | ICD-10-CM | POA: Diagnosis not present

## 2020-10-09 DIAGNOSIS — M9904 Segmental and somatic dysfunction of sacral region: Secondary | ICD-10-CM | POA: Diagnosis not present

## 2020-10-09 DIAGNOSIS — M9902 Segmental and somatic dysfunction of thoracic region: Secondary | ICD-10-CM | POA: Diagnosis not present

## 2020-10-09 DIAGNOSIS — M5417 Radiculopathy, lumbosacral region: Secondary | ICD-10-CM | POA: Diagnosis not present

## 2020-10-13 DIAGNOSIS — M5136 Other intervertebral disc degeneration, lumbar region: Secondary | ICD-10-CM | POA: Diagnosis not present

## 2020-10-13 DIAGNOSIS — M9904 Segmental and somatic dysfunction of sacral region: Secondary | ICD-10-CM | POA: Diagnosis not present

## 2020-10-13 DIAGNOSIS — M9903 Segmental and somatic dysfunction of lumbar region: Secondary | ICD-10-CM | POA: Diagnosis not present

## 2020-10-13 DIAGNOSIS — M9902 Segmental and somatic dysfunction of thoracic region: Secondary | ICD-10-CM | POA: Diagnosis not present

## 2020-10-13 DIAGNOSIS — M5417 Radiculopathy, lumbosacral region: Secondary | ICD-10-CM | POA: Diagnosis not present

## 2020-10-16 DIAGNOSIS — M9903 Segmental and somatic dysfunction of lumbar region: Secondary | ICD-10-CM | POA: Diagnosis not present

## 2020-10-16 DIAGNOSIS — M9904 Segmental and somatic dysfunction of sacral region: Secondary | ICD-10-CM | POA: Diagnosis not present

## 2020-10-16 DIAGNOSIS — M5417 Radiculopathy, lumbosacral region: Secondary | ICD-10-CM | POA: Diagnosis not present

## 2020-10-16 DIAGNOSIS — M9902 Segmental and somatic dysfunction of thoracic region: Secondary | ICD-10-CM | POA: Diagnosis not present

## 2020-10-16 DIAGNOSIS — M5136 Other intervertebral disc degeneration, lumbar region: Secondary | ICD-10-CM | POA: Diagnosis not present

## 2020-10-20 DIAGNOSIS — M9904 Segmental and somatic dysfunction of sacral region: Secondary | ICD-10-CM | POA: Diagnosis not present

## 2020-10-20 DIAGNOSIS — J019 Acute sinusitis, unspecified: Secondary | ICD-10-CM | POA: Diagnosis not present

## 2020-10-20 DIAGNOSIS — M5136 Other intervertebral disc degeneration, lumbar region: Secondary | ICD-10-CM | POA: Diagnosis not present

## 2020-10-20 DIAGNOSIS — M9903 Segmental and somatic dysfunction of lumbar region: Secondary | ICD-10-CM | POA: Diagnosis not present

## 2020-10-20 DIAGNOSIS — M9902 Segmental and somatic dysfunction of thoracic region: Secondary | ICD-10-CM | POA: Diagnosis not present

## 2020-10-20 DIAGNOSIS — M5417 Radiculopathy, lumbosacral region: Secondary | ICD-10-CM | POA: Diagnosis not present

## 2020-10-27 DIAGNOSIS — M5136 Other intervertebral disc degeneration, lumbar region: Secondary | ICD-10-CM | POA: Diagnosis not present

## 2020-10-27 DIAGNOSIS — M9903 Segmental and somatic dysfunction of lumbar region: Secondary | ICD-10-CM | POA: Diagnosis not present

## 2020-10-27 DIAGNOSIS — M9904 Segmental and somatic dysfunction of sacral region: Secondary | ICD-10-CM | POA: Diagnosis not present

## 2020-10-27 DIAGNOSIS — M9902 Segmental and somatic dysfunction of thoracic region: Secondary | ICD-10-CM | POA: Diagnosis not present

## 2020-10-27 DIAGNOSIS — M5417 Radiculopathy, lumbosacral region: Secondary | ICD-10-CM | POA: Diagnosis not present

## 2020-11-03 DIAGNOSIS — M5417 Radiculopathy, lumbosacral region: Secondary | ICD-10-CM | POA: Diagnosis not present

## 2020-11-03 DIAGNOSIS — M9903 Segmental and somatic dysfunction of lumbar region: Secondary | ICD-10-CM | POA: Diagnosis not present

## 2020-11-03 DIAGNOSIS — M5136 Other intervertebral disc degeneration, lumbar region: Secondary | ICD-10-CM | POA: Diagnosis not present

## 2020-11-03 DIAGNOSIS — M9904 Segmental and somatic dysfunction of sacral region: Secondary | ICD-10-CM | POA: Diagnosis not present

## 2020-11-03 DIAGNOSIS — M9902 Segmental and somatic dysfunction of thoracic region: Secondary | ICD-10-CM | POA: Diagnosis not present

## 2020-11-10 DIAGNOSIS — M5417 Radiculopathy, lumbosacral region: Secondary | ICD-10-CM | POA: Diagnosis not present

## 2020-11-10 DIAGNOSIS — M5136 Other intervertebral disc degeneration, lumbar region: Secondary | ICD-10-CM | POA: Diagnosis not present

## 2020-11-10 DIAGNOSIS — M9902 Segmental and somatic dysfunction of thoracic region: Secondary | ICD-10-CM | POA: Diagnosis not present

## 2020-11-10 DIAGNOSIS — M9904 Segmental and somatic dysfunction of sacral region: Secondary | ICD-10-CM | POA: Diagnosis not present

## 2020-11-10 DIAGNOSIS — M9903 Segmental and somatic dysfunction of lumbar region: Secondary | ICD-10-CM | POA: Diagnosis not present

## 2020-11-19 ENCOUNTER — Telehealth (INDEPENDENT_AMBULATORY_CARE_PROVIDER_SITE_OTHER): Payer: Medicare Other | Admitting: Family Medicine

## 2020-11-19 ENCOUNTER — Encounter: Payer: Self-pay | Admitting: Family Medicine

## 2020-11-19 DIAGNOSIS — J310 Chronic rhinitis: Secondary | ICD-10-CM

## 2020-11-19 DIAGNOSIS — J329 Chronic sinusitis, unspecified: Secondary | ICD-10-CM

## 2020-11-19 MED ORDER — GUAIFENESIN-CODEINE 100-10 MG/5ML PO SYRP
5.0000 mL | ORAL_SOLUTION | Freq: Three times a day (TID) | ORAL | 0 refills | Status: DC | PRN
Start: 2020-11-19 — End: 2020-12-15

## 2020-11-19 MED ORDER — CEFDINIR 300 MG PO CAPS: 300.0000 mg | ORAL_CAPSULE | Freq: Two times a day (BID) | ORAL | 0 refills | Status: DC

## 2020-11-19 NOTE — Progress Notes (Signed)
VIRTUAL VISIT VIA VIDEO  I connected with [name] on 11/19/20 at  8:00 AM EST by elemedicine application and verified that I am speaking with the correct person using two identifiers. Location patient: Home Location provider: [facility], Office Persons participating in the virtual visit: Patient, Dr. Raoul Pitch and Samul Dada, CMA  I discussed the limitations of evaluation and management by telemedicine and the availability of in person appointments. The patient expressed understanding and agreed to proceed.   SUBJECTIVE Chief Complaint  Patient presents with  . Nasal Congestion    Pt c/o nasal congestion, runny nose, post nasal drip x 2 days    HPI: NOEMY HALLMON is a 74 y.o. female present for acute concern. Pt reports she has experienced a PND-thick green, nasal congestion, bloody nose and runny nose of 2-3 days duration. Her grandchild has bronchitis- covid negative. She denies fever, chills. No loss of sense of taste smell, No shortness of breath or GI symptoms.  She endorses sinus pressure and feels like her face is swelling. She had similar sx 3 weeks ago that got better- then came back. Cough at night with drainage. She is taking zyrtec and promethazine cough syrup.  - Moderna x3 ROS: See pertinent positives and negatives per HPI.  Patient Active Problem List   Diagnosis Date Noted  . Aortic atherosclerosis ([place]) 08/27/2020  . Liver hemangioma 08/27/2020  . Combined forms of age-related cataract of left eye 07/19/2019  . Regular astigmatism, left eye 07/19/2019  . Interstitial cystitis   . Bilateral carpal tunnel syndrome 09/12/2018  . Pain emptying bladder 06/22/2018  . Scoliosis of lumbar region due to degenerative disease of spine in adult 05/31/2018  . Atherosclerosis of aorta ([place]) 05/31/2018  . Sciatica of right side associated with disorder of lumbar spine 05/04/2018  . Numbness and tingling of right leg 05/04/2018  . Sleep disturbance  06/30/2017  . Elevated TSH 06/09/2017  . Diverticulosis of intestine without bleeding 06/09/2017  . Prediabetes 04/14/2017  . Depression with anxiety 05/26/2009  . GERD 04/08/2008  . History of colonic polyps 04/21/2007    Social History   Tobacco Use  . Smoking status: Former Smoker    Quit date: 12/06/1973    Years since quitting: 46.9  . Smokeless tobacco: Never Used  Substance Use Topics  . Alcohol use: No    Current Outpatient Medications:  .  ALPRAZolam (XANAX) 0.5 MG tablet, Take 1 tablet (0.5 mg total) by mouth 2 (two) times daily as needed for anxiety., Disp: 180 tablet, Rfl: 0 .  Ascorbic Acid (VITAMIN C) 1000 MG tablet, Take 1,000 mg by mouth daily., Disp: , Rfl:  .  Baclofen 5 MG TABS, Take 5 mg by mouth 2 (two) times daily as needed., Disp: 180 tablet, Rfl: 1 .  Biotin w/ Vitamins C & E (HAIR/SKIN/NAILS PO), Take by mouth., Disp: , Rfl:  .  Cyanocobalamin (VITAMIN B 12 PO), Take by mouth., Disp: , Rfl:  .  fluticasone (FLONASE) 50 MCG/ACT nasal spray, Place 1 spray into both nostrils daily., Disp: 48 g, Rfl: 0 .  gabapentin (NEURONTIN) 100 MG capsule, Take 1 capsule (100 mg total) by mouth 3 (three) times daily., Disp: 270 capsule, Rfl: 1 .  meloxicam (MOBIC) 15 MG tablet, Take 1 tablet (15 mg total) by mouth daily., Disp: 90 tablet, Rfl: 3 .  Nutritional Supplements (JUICE PLUS FIBRE PO), Take by mouth., Disp: , Rfl:  .  pantoprazole (PROTONIX) 40 MG tablet, Take 1  tablet (40 mg total) by mouth daily., Disp: 90 tablet, Rfl: 3 .  Probiotic Product (RA PROBIOTIC GUMMIES PO), Take by mouth as needed., Disp: , Rfl:  .  tolterodine (DETROL LA) 4 MG 24 hr capsule, Take 1 capsule (4 mg total) by mouth daily as needed., Disp: 90 capsule, Rfl: 3 .  TURMERIC PO, Take by mouth., Disp: , Rfl:  .  Vitamin D, Cholecalciferol, 25 MCG (1000 UT) CAPS, Take 2 capsules by mouth daily., Disp: , Rfl:  .  cefdinir (OMNICEF) 300 MG capsule, Take 1 capsule (300 mg total) by mouth 2 (two) times  daily., Disp: 20 capsule, Rfl: 0 .  guaiFENesin-codeine (ROBITUSSIN AC) 100-10 MG/5ML syrup, Take 5 mLs by mouth 3 (three) times daily as needed for cough., Disp: 120 mL, Rfl: 0 .  hyoscyamine (LEVSIN SL) 0.125 MG SL tablet, Place 1 tablet (0.125 mg total) under the tongue every 8 (eight) hours as needed. (Patient not taking: Reported on 11/19/2020), Disp: 90 tablet, Rfl: 3  Allergies  Allergen Reactions  . Naproxen Diarrhea  . Paroxetine     REACTION: unspecified  . Promethazine     Hyperactive/jittery/insomnia  . Clarithromycin Nausea Only  . Doxycycline Hyclate Nausea And Vomiting  . Erythromycin Ethylsuccinate Nausea Only  . Lexapro [Escitalopram Oxalate] Other (See Comments)    Paresthesia     OBJECTIVE: There were no vitals taken for this visit. Gen: No acute distress. Nontoxic in appearance.  HENT: AT. [place].  MMM.  Eyes:Pupils Equal Round Reactive to light, Extraocular movements intact,  Conjunctiva without redness, discharge or icterus. Chest: Cough not present on exam.  Skin: no rashes, purpura or petechiae.  Neuro: Alert. Oriented x3  Psych: Normal affect and demeanor. Normal speech. Normal thought content and judgment.  ASSESSMENT AND PLAN: ATHA MURADYAN is a 74 y.o. female present for  Rhinosinusitis Viral illness 3 weeks ago- returned causing sinus infection.  Rest, hydrate.  Start  mucinex (DM if cough), nettie pot or nasal saline.  Continue flonase and zyrtec.  omnicef and robitussin AC prescribed, take until completed.  If cough present it can last up to 6-8 weeks.  F/U 2 weeks of not improved.    Howard Pouch, DO 11/19/2020   No follow-ups on file.  No orders of the defined types were placed in this encounter.  Meds ordered this encounter  Medications  . cefdinir (OMNICEF) 300 MG capsule    Sig: Take 1 capsule (300 mg total) by mouth 2 (two) times daily.    Dispense:  20 capsule    Refill:  0  . guaiFENesin-codeine (ROBITUSSIN AC) 100-10  MG/5ML syrup    Sig: Take 5 mLs by mouth 3 (three) times daily as needed for cough.    Dispense:  120 mL    Refill:  0   Referral Orders  No referral(s) requested today

## 2020-11-25 DIAGNOSIS — M25551 Pain in right hip: Secondary | ICD-10-CM | POA: Diagnosis not present

## 2020-11-25 NOTE — Progress Notes (Signed)
Subjective:   Victoria Bryant is a 74 y.o. female who presents for Medicare Annual (Subsequent) preventive examination.  I connected with Shella today by telephone and verified that I am speaking with the correct person using two identifiers. Location patient: home Location provider: work Persons participating in the virtual visit: patient, Marine scientist.    I discussed the limitations, risks, security and privacy concerns of performing an evaluation and management service by telephone and the availability of in person appointments. I also discussed with the patient that there may be a patient responsible charge related to this service. The patient expressed understanding and verbally consented to this telephonic visit.    Interactive audio and video telecommunications were attempted between this provider and patient, however failed, due to patient having technical difficulties OR patient did not have access to video capability.  We continued and completed visit with audio only.  Some vital signs may be absent or patient reported.   Time Spent with patient on telephone encounter: 20 minutes   Review of Systems     Cardiac Risk Factors include: advanced age (>57men, >19 women)     Objective:    Today's Vitals   11/26/20 0814  Weight: 148 lb (67.1 kg)  Height: 5\' 3"  (1.6 m)   Body mass index is 26.22 kg/m.  Advanced Directives 11/26/2020 07/25/2020 11/19/2019 11/19/2019 10/27/2018 10/24/2018 06/23/2018  Does Patient Have a Medical Advance Directive? Yes Yes Yes Yes Yes Yes Yes  Type of Paramedic of [place];Living will - [place];Living will;Out of facility DNR (pink MOST or yellow form) - [place];Living will [place];Living will [place];Living will  Does patient want to make changes to medical advance directive? - No - Patient declined Yes (ED - Information included in AVS) - -  - -  Copy of [place] in Chart? No - copy requested - Yes - validated most recent copy scanned in chart (See row information) - No - copy requested No - copy requested -    Current Medications (verified) Outpatient Encounter Medications as of 11/26/2020  Medication Sig  . ALPRAZolam (XANAX) 0.5 MG tablet Take 1 tablet (0.5 mg total) by mouth 2 (two) times daily as needed for anxiety.  . Ascorbic Acid (VITAMIN C) 1000 MG tablet Take 1,000 mg by mouth daily.  . Baclofen 5 MG TABS Take 5 mg by mouth 2 (two) times daily as needed.  . Biotin w/ Vitamins C & E (HAIR/SKIN/NAILS PO) Take by mouth.  . cefdinir (OMNICEF) 300 MG capsule Take 1 capsule (300 mg total) by mouth 2 (two) times daily.  . Cyanocobalamin (VITAMIN B 12 PO) Take by mouth.  . fluticasone (FLONASE) 50 MCG/ACT nasal spray Place 1 spray into both nostrils daily.  Marland Kitchen gabapentin (NEURONTIN) 100 MG capsule Take 1 capsule (100 mg total) by mouth 3 (three) times daily.  Marland Kitchen guaiFENesin-codeine (ROBITUSSIN AC) 100-10 MG/5ML syrup Take 5 mLs by mouth 3 (three) times daily as needed for cough.  . meloxicam (MOBIC) 15 MG tablet Take 1 tablet (15 mg total) by mouth daily.  . Nutritional Supplements (JUICE PLUS FIBRE PO) Take by mouth.  . pantoprazole (PROTONIX) 40 MG tablet Take 1 tablet (40 mg total) by mouth daily.  . Probiotic Product (RA PROBIOTIC GUMMIES PO) Take by mouth as needed.  . tolterodine (DETROL LA) 4 MG 24 hr capsule Take 1 capsule (4 mg total) by mouth daily as needed.  Marland Kitchen  TURMERIC PO Take by mouth.  . Vitamin D, Cholecalciferol, 25 MCG (1000 UT) CAPS Take 2 capsules by mouth daily.  . hyoscyamine (LEVSIN SL) 0.125 MG SL tablet Place 1 tablet (0.125 mg total) under the tongue every 8 (eight) hours as needed. (Patient not taking: No sig reported)   No facility-administered encounter medications on file as of 11/26/2020.    Allergies (verified) Naproxen, Paroxetine, Promethazine, Clarithromycin, Doxycycline  hyclate, Erythromycin ethylsuccinate, and Lexapro [escitalopram oxalate]   History: Past Medical History:  Diagnosis Date  . Arthritis   . COLONIC POLYPS, HX OF 04/21/2007  . DEPRESSION 05/26/2009  . Diverticulitis   . Epiretinal membrane, left eye   . GERD 04/08/2008  . Glaucoma suspect of both eyes    [facility]  . Interstitial cystitis    ? per pt: was told by urologist she has this  . 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 MENOPAUSAL SYNDROME 04/21/2007  . NECK PAIN, CHRONIC 04/21/2007  . Posterior vitreous detachment of both eyes    [facility]  . Pseudophakia of both eyes    [facility]  . Unspecified disorder of refraction    both eyes   Past Surgical History:  Procedure Laterality Date  . COLONOSCOPY  x 2   First time polyps, second time no polyps: recall 10 yrs (next is approx 2024 per pt report)  . INCISION AND DRAINAGE ABSCESS ANAL    . POLYPECTOMY    . TONSILLECTOMY    . UPPER GASTROINTESTINAL ENDOSCOPY    . VAGINAL HYSTERECTOMY     Nonmalignant reason.  Ovaries still in.   Family History  Problem Relation Age of Onset  . Bladder Cancer Mother   . Lung cancer Mother   . Breast cancer Paternal Aunt   . ALS Daughter   . Fibromyalgia Sister   . Heart disease Brother   . Alcohol abuse Brother   . Colon cancer Neg Hx   . Colon polyps Neg Hx   . Esophageal cancer Neg Hx   . Rectal cancer Neg Hx   . Stomach cancer Neg Hx    Social History   Socioeconomic History  . Marital status: Widowed    Spouse name: Not on file  . Number of children: Not on file  . Years of education: Not on file  . Highest education level: Not on file  Occupational History  . Occupation: Retired  Tobacco Use  . Smoking status: Former Smoker    Quit date: 12/06/1973    Years since quitting: 47.0  . Smokeless tobacco: Never Used  Vaping Use  . Vaping Use: Never used  Substance and Sexual Activity  . Alcohol use: No  . Drug use: No   . Sexual activity: Not on file  Other Topics Concern  . Not on file  Social History Narrative   Married, one daughter lives next door to her.   Retired Art gallery manager.   Tob: 20 pack yr hx, quit age 83.   No alc.   Social Determinants of Health   Financial Resource Strain: Low Risk   . Difficulty of Paying Living Expenses: Not hard at all  Food Insecurity: No Food Insecurity  . Worried About Charity fundraiser in the Last Year: Never true  . Ran Out of Food in the Last Year: Never true  Transportation Needs: No Transportation Needs  . Lack of Transportation (Medical): No  . Lack of Transportation (Non-Medical): No  Physical Activity: Insufficiently  Active  . Days of Exercise per Week: 2 days  . Minutes of Exercise per Session: 50 min  Stress: No Stress Concern Present  . Feeling of Stress : Not at all  Social Connections: Moderately Isolated  . Frequency of Communication with Friends and Family: More than three times a week  . Frequency of Social Gatherings with Friends and Family: More than three times a week  . Attends Religious Services: More than 4 times per year  . Active Member of Clubs or Organizations: No  . Attends Archivist Meetings: Never  . Marital Status: Widowed    Tobacco Counseling Counseling given: Not Answered   Clinical Intake:  Pre-visit preparation completed: Yes  Pain : No/denies pain     Nutritional Status: BMI 25 -29 Overweight Nutritional Risks: None Diabetes: No  How often do you need to have someone help you when you read instructions, pamphlets, or other written materials from your doctor or pharmacy?: 1 - Never What is the last grade level you completed in school?: GED  Diabetic?No  Interpreter Needed?: No  Information entered by :: Caroleen Hamman LPN   Activities of Daily Living In your present state of health, do you have any difficulty performing the following activities: 11/26/2020  Hearing? N   Vision? N  Difficulty concentrating or making decisions? N  Walking or climbing stairs? N  Dressing or bathing? N  Doing errands, shopping? N  Preparing Food and eating ? N  Using the Toilet? N  In the past six months, have you accidently leaked urine? N  Do you have problems with loss of bowel control? N  Managing your Medications? N  Managing your Finances? N  Housekeeping or managing your Housekeeping? N  Some recent data might be hidden    Patient Care Team: [name], DO as PCP - General (Family Medicine) [name], MD as Consulting Physician (Obstetrics and Gynecology) [name], MD (Inactive) as Consulting Physician (Gastroenterology) Ladene Artist, MD as Consulting Physician (Gastroenterology) Lyndal Pulley, DO as Consulting Physician (Family Medicine) Allyn Kenner, MD (Dermatology)  Indicate any recent Medical Services you may have received from other than [facility] providers in the past year (date may be approximate).     Assessment:   This is a routine wellness examination for Yanieliz.  Hearing/Vision screen  Hearing Screening   125Hz  250Hz  500Hz  1000Hz  2000Hz  3000Hz  4000Hz  6000Hz  8000Hz   Right ear:           Left ear:           Comments: No issues  Vision Screening Comments: Reading glasses Last eye exam-10/2020-[facility]  Dietary issues and exercise activities discussed: Current Exercise Habits: Structured exercise class (Physical Therapy), Type of exercise: strength training/weights, Time (Minutes): 60, Frequency (Times/Week): 2, Weekly Exercise (Minutes/Week): 120, Exercise limited by: None identified  Goals    . Increase physical activity     Increase activity.     . Patient Stated     Maintain healthy lifestyle.       Depression Screen PHQ 2/9 Scores 11/26/2020 11/19/2019 10/24/2018 03/15/2018 11/21/2017 10/21/2017 07/28/2017  PHQ - 2 Score 0 0 0 0 0 2 0  PHQ- 9 Score - - 0 - 1 5 4     Fall Risk Fall Risk  11/26/2020 11/19/2019  10/24/2018 05/04/2018 10/21/2017  Falls in the past year? 0 0 0 No No  Number falls in past yr: 0 0 - - -  Injury with Fall? 0 - - - -  Follow up Falls prevention discussed Education provided;Falls evaluation completed;Falls prevention discussed - - -    FALL RISK PREVENTION PERTAINING TO THE HOME:  Any stairs in or around the home? No  Home free of loose throw rugs in walkways, pet beds, electrical cords, etc? Yes  Adequate lighting in your home to reduce risk of falls? Yes   ASSISTIVE DEVICES UTILIZED TO PREVENT FALLS:  Life alert? No  Use of a cane, walker or w/c? No  Grab bars in the bathroom? Yes  Shower chair or bench in shower? No  Elevated toilet seat or a handicapped toilet? No   TIMED UP AND GO:  Was the test performed? No . Phone visit   Cognitive Function:Normal cognitive status assessed by this Nurse Health Advisor. No abnormalities found.   MMSE - Mini Mental State Exam 10/24/2018 10/21/2017  Orientation to time 5 5  Orientation to Place 5 5  Registration 3 3  Attention/ Calculation 5 5  Recall 2 3  Language- name 2 objects 2 2  Language- repeat 1 1  Language- follow 3 step command 3 3  Language- read & follow direction 1 1  Write a sentence 1 1  Copy design 1 1  Total score 29 30        Immunizations Immunization History  Administered Date(s) Administered  . Fluad Quad(high Dose 65+) 08/07/2019, 08/22/2020  . Influenza Split 09/20/2011, 09/25/2012  . Influenza Whole 08/27/2010  . Influenza, High Dose Seasonal PF 10/06/2015, 12/26/2017, 09/08/2018  . Influenza,inj,Quad PF,6+ Mos 09/26/2013  . Influenza-Unspecified 09/17/2014  . Moderna Sars-Covid-2 Vaccination 02/16/2020, 03/15/2020, 10/03/2020  . Pneumococcal Conjugate-13 10/06/2015  . Pneumococcal Polysaccharide-23 09/20/2011  . Td 05/26/2009  . Zoster 09/25/2012    TDAP status: Due, Education has been provided regarding the importance of this vaccine. Advised may receive this vaccine at  local pharmacy or Health Dept. Aware to provide a copy of the vaccination record if obtained from local pharmacy or Health Dept. Verbalized acceptance and understanding.  Flu Vaccine status: Up to date  Pneumococcal vaccine status: Up to date  Covid-19 vaccine status: Completed vaccines  Qualifies for Shingles Vaccine? Yes   Zostavax completed Yes   Shingrix Completed?: No.    Education has been provided regarding the importance of this vaccine. Patient has been advised to call insurance company to determine out of pocket expense if they have not yet received this vaccine. Advised may also receive vaccine at local pharmacy or Health Dept. Verbalized acceptance and understanding.  Screening Tests Health Maintenance  Topic Date Due  . TETANUS/TDAP  05/27/2019  . MAMMOGRAM  12/31/2020  . DEXA SCAN  11/16/2023  . INFLUENZA VACCINE  Completed  . COVID-19 Vaccine  Completed  . Hepatitis C Screening  Completed  . PNA vac Low Risk Adult  Completed    Health Maintenance  Health Maintenance Due  Topic Date Due  . TETANUS/TDAP  05/27/2019    Colorectal cancer screening: No longer required.   Mammogram status: Completed Bilateral-01/01/2020. Repeat every year  Done Density status: Due- Patient declined today. She wants to discuss with PCP at next office visit.  Lung Cancer Screening: (Low Dose CT Chest recommended if Age 75-80 years, 30 pack-year currently smoking OR have quit w/in 15years.) does not qualify.    Additional Screening:  Hepatitis C Screening: Completed 10/06/2016  Vision Screening: Recommended annual ophthalmology exams for early detection of glaucoma and other disorders of the eye. Is the patient up to date with their annual eye exam?  Yes  Who is the provider or what is the name of the office in which the patient attends annual eye exams? [facility]   Dental Screening: Recommended annual dental exams for proper oral hygiene  Community Resource Referral / Chronic  Care Management: CRR required this visit?  No   CCM required this visit?  No      Plan:     I have personally reviewed and noted the following in the patient's chart:   . Medical and social history . Use of alcohol, tobacco or illicit drugs  . Current medications and supplements . Functional ability and status . Nutritional status . Physical activity . Advanced directives . List of other physicians . Hospitalizations, surgeries, and ER visits in previous 12 months . Vitals . Screenings to include cognitive, depression, and falls . Referrals and appointments  In addition, I have reviewed and discussed with patient certain preventive protocols, quality metrics, and best practice recommendations. A written personalized care plan for preventive services as well as general preventive health recommendations were provided to patient.   Due to this being a telephonic visit, the after visit summary with patients personalized plan was offered to patient via mail or my-chart. Patient would like to access on my-chart.   Marta Antu, LPN   [id number]  Nurse Health Advisor  Nurse Notes: None

## 2020-11-26 ENCOUNTER — Ambulatory Visit (INDEPENDENT_AMBULATORY_CARE_PROVIDER_SITE_OTHER): Payer: Medicare Other

## 2020-11-26 VITALS — Ht 63.0 in | Wt 148.0 lb

## 2020-11-26 DIAGNOSIS — Z0000 Encounter for general adult medical examination without abnormal findings: Secondary | ICD-10-CM

## 2020-11-26 NOTE — Patient Instructions (Signed)
Victoria Bryant , Thank you for taking time to complete your Medicare Wellness Visit. I appreciate your ongoing commitment to your health goals. Please review the following plan we discussed and let me know if I can assist you in the future.   Screening recommendations/referrals: Colonoscopy: No longer required Mammogram: Scheduled for 01/06/2020 Bone Density: Due-Discuss with PCP Recommended yearly ophthalmology/optometry visit for glaucoma screening and checkup Recommended yearly dental visit for hygiene and checkup  Vaccinations: Influenza vaccine: Up to date Pneumococcal vaccine: Completed vaccines Tdap vaccine: Discuss with pharmacy Shingles vaccine: Discuss with pharmacy   Covid-19:Completed vaccines  Advanced directives: Please bring a copy for your chart  Conditions/risks identified: See problem list  Next appointment: Follow up in one year for your annual wellness visit    Preventive Care 65 Years and Older, Female Preventive care refers to lifestyle choices and visits with your health care provider that can promote health and wellness. What does preventive care include?  A yearly physical exam. This is also called an annual well check.  Dental exams once or twice a year.  Routine eye exams. Ask your health care provider how often you should have your eyes checked.  Personal lifestyle choices, including:  Daily care of your teeth and gums.  Regular physical activity.  Eating a healthy diet.  Avoiding tobacco and drug use.  Limiting alcohol use.  Practicing safe sex.  Taking low-dose aspirin every day.  Taking vitamin and mineral supplements as recommended by your health care provider. What happens during an annual well check? The services and screenings done by your health care provider during your annual well check will depend on your age, overall health, lifestyle risk factors, and family history of disease. Counseling  Your health care provider may ask you  questions about your:  Alcohol use.  Tobacco use.  Drug use.  Emotional well-being.  Home and relationship well-being.  Sexual activity.  Eating habits.  History of falls.  Memory and ability to understand (cognition).  Work and work Statistician.  Reproductive health. Screening  You may have the following tests or measurements:  Height, weight, and BMI.  Blood pressure.  Lipid and cholesterol levels. These may be checked every 5 years, or more frequently if you are over 23 years old.  Skin check.  Lung cancer screening. You may have this screening every year starting at age 47 if you have a 30-pack-year history of smoking and currently smoke or have quit within the past 15 years.  Fecal occult blood test (FOBT) of the stool. You may have this test every year starting at age 4.  Flexible sigmoidoscopy or colonoscopy. You may have a sigmoidoscopy every 5 years or a colonoscopy every 10 years starting at age 47.  Hepatitis C blood test.  Hepatitis B blood test.  Sexually transmitted disease (STD) testing.  Diabetes screening. This is done by checking your blood sugar (glucose) after you have not eaten for a while (fasting). You may have this done every 1-3 years.  Bone density scan. This is done to screen for osteoporosis. You may have this done starting at age 57.  Mammogram. This may be done every 1-2 years. Talk to your health care provider about how often you should have regular mammograms. Talk with your health care provider about your test results, treatment options, and if necessary, the need for more tests. Vaccines  Your health care provider may recommend certain vaccines, such as:  Influenza vaccine. This is recommended every year.  Tetanus, diphtheria,  and acellular pertussis (Tdap, Td) vaccine. You may need a Td booster every 10 years.  Zoster vaccine. You may need this after age 34.  Pneumococcal 13-valent conjugate (PCV13) vaccine. One dose is  recommended after age 61.  Pneumococcal polysaccharide (PPSV23) vaccine. One dose is recommended after age 37. Talk to your health care provider about which screenings and vaccines you need and how often you need them. This information is not intended to replace advice given to you by your health care provider. Make sure you discuss any questions you have with your health care provider. Document Released: 12/19/2015 Document Revised: 08/11/2016 Document Reviewed: 09/23/2015 Elsevier Interactive Patient Education  2017 [place] Prevention in the Home Falls can cause injuries. They can happen to people of all ages. There are many things you can do to make your home safe and to help prevent falls. What can I do on the outside of my home?  Regularly fix the edges of walkways and driveways and fix any cracks.  Remove anything that might make you trip as you walk through a door, such as a raised step or threshold.  Trim any bushes or trees on the path to your home.  Use bright outdoor lighting.  Clear any walking paths of anything that might make someone trip, such as rocks or tools.  Regularly check to see if handrails are loose or broken. Make sure that both sides of any steps have handrails.  Any raised decks and porches should have guardrails on the edges.  Have any leaves, snow, or ice cleared regularly.  Use sand or salt on walking paths during winter.  Clean up any spills in your garage right away. This includes oil or grease spills. What can I do in the bathroom?  Use night lights.  Install grab bars by the toilet and in the tub and shower. Do not use towel bars as grab bars.  Use non-skid mats or decals in the tub or shower.  If you need to sit down in the shower, use a plastic, non-slip stool.  Keep the floor dry. Clean up any water that spills on the floor as soon as it happens.  Remove soap buildup in the tub or shower regularly.  Attach bath mats  securely with double-sided non-slip rug tape.  Do not have throw rugs and other things on the floor that can make you trip. What can I do in the bedroom?  Use night lights.  Make sure that you have a light by your bed that is easy to reach.  Do not use any sheets or blankets that are too big for your bed. They should not hang down onto the floor.  Have a firm chair that has side arms. You can use this for support while you get dressed.  Do not have throw rugs and other things on the floor that can make you trip. What can I do in the kitchen?  Clean up any spills right away.  Avoid walking on wet floors.  Keep items that you use a lot in easy-to-reach places.  If you need to reach something above you, use a strong step stool that has a grab bar.  Keep electrical cords out of the way.  Do not use floor polish or wax that makes floors slippery. If you must use wax, use non-skid floor wax.  Do not have throw rugs and other things on the floor that can make you trip. What can I do with my  stairs?  Do not leave any items on the stairs.  Make sure that there are handrails on both sides of the stairs and use them. Fix handrails that are broken or loose. Make sure that handrails are as long as the stairways.  Check any carpeting to make sure that it is firmly attached to the stairs. Fix any carpet that is loose or worn.  Avoid having throw rugs at the top or bottom of the stairs. If you do have throw rugs, attach them to the floor with carpet tape.  Make sure that you have a light switch at the top of the stairs and the bottom of the stairs. If you do not have them, ask someone to add them for you. What else can I do to help prevent falls?  Wear shoes that:  Do not have high heels.  Have rubber bottoms.  Are comfortable and fit you well.  Are closed at the toe. Do not wear sandals.  If you use a stepladder:  Make sure that it is fully opened. Do not climb a closed  stepladder.  Make sure that both sides of the stepladder are locked into place.  Ask someone to hold it for you, if possible.  Clearly mark and make sure that you can see:  Any grab bars or handrails.  First and last steps.  Where the edge of each step is.  Use tools that help you move around (mobility aids) if they are needed. These include:  Canes.  Walkers.  Scooters.  Crutches.  Turn on the lights when you go into a dark area. Replace any light bulbs as soon as they burn out.  Set up your furniture so you have a clear path. Avoid moving your furniture around.  If any of your floors are uneven, fix them.  If there are any pets around you, be aware of where they are.  Review your medicines with your doctor. Some medicines can make you feel dizzy. This can increase your chance of falling. Ask your doctor what other things that you can do to help prevent falls. This information is not intended to replace advice given to you by your health care provider. Make sure you discuss any questions you have with your health care provider. Document Released: 09/18/2009 Document Revised: 04/29/2016 Document Reviewed: 12/27/2014 Elsevier Interactive Patient Education  2017 [employer].

## 2020-11-27 DIAGNOSIS — J209 Acute bronchitis, unspecified: Secondary | ICD-10-CM | POA: Diagnosis not present

## 2020-11-27 DIAGNOSIS — Z03818 Encounter for observation for suspected exposure to other biological agents ruled out: Secondary | ICD-10-CM | POA: Diagnosis not present

## 2020-11-27 DIAGNOSIS — J029 Acute pharyngitis, unspecified: Secondary | ICD-10-CM | POA: Diagnosis not present

## 2020-12-09 ENCOUNTER — Encounter: Payer: Medicare Other | Admitting: Family Medicine

## 2020-12-11 DIAGNOSIS — M25551 Pain in right hip: Secondary | ICD-10-CM | POA: Diagnosis not present

## 2020-12-15 ENCOUNTER — Encounter: Payer: Self-pay | Admitting: Family Medicine

## 2020-12-15 ENCOUNTER — Ambulatory Visit (INDEPENDENT_AMBULATORY_CARE_PROVIDER_SITE_OTHER): Payer: Medicare Other | Admitting: Family Medicine

## 2020-12-15 ENCOUNTER — Other Ambulatory Visit: Payer: Self-pay

## 2020-12-15 VITALS — BP 138/73 | HR 75 | Temp 98.5°F | Ht 60.5 in | Wt 149.0 lb

## 2020-12-15 DIAGNOSIS — I700 Atherosclerosis of aorta: Secondary | ICD-10-CM

## 2020-12-15 DIAGNOSIS — Z0000 Encounter for general adult medical examination without abnormal findings: Secondary | ICD-10-CM | POA: Diagnosis not present

## 2020-12-15 DIAGNOSIS — K219 Gastro-esophageal reflux disease without esophagitis: Secondary | ICD-10-CM | POA: Diagnosis not present

## 2020-12-15 DIAGNOSIS — F418 Other specified anxiety disorders: Secondary | ICD-10-CM

## 2020-12-15 DIAGNOSIS — G479 Sleep disorder, unspecified: Secondary | ICD-10-CM

## 2020-12-15 DIAGNOSIS — M5386 Other specified dorsopathies, lumbar region: Secondary | ICD-10-CM | POA: Diagnosis not present

## 2020-12-15 DIAGNOSIS — R7303 Prediabetes: Secondary | ICD-10-CM

## 2020-12-15 MED ORDER — MELOXICAM 15 MG PO TABS
15.0000 mg | ORAL_TABLET | Freq: Every day | ORAL | 3 refills | Status: DC
Start: 2020-12-15 — End: 2021-06-02

## 2020-12-15 MED ORDER — GABAPENTIN 100 MG PO CAPS
100.0000 mg | ORAL_CAPSULE | Freq: Three times a day (TID) | ORAL | 1 refills | Status: DC
Start: 2020-12-15 — End: 2021-06-01

## 2020-12-15 MED ORDER — MELOXICAM 15 MG PO TABS
15.0000 mg | ORAL_TABLET | Freq: Every day | ORAL | 3 refills | Status: DC
Start: 2020-12-15 — End: 2020-12-15

## 2020-12-15 MED ORDER — BACLOFEN 5 MG PO TABS
5.0000 mg | ORAL_TABLET | Freq: Two times a day (BID) | ORAL | 1 refills | Status: DC | PRN
Start: 2020-12-15 — End: 2021-06-02

## 2020-12-15 MED ORDER — HYOSCYAMINE SULFATE 0.125 MG SL SUBL
0.1250 mg | SUBLINGUAL_TABLET | Freq: Three times a day (TID) | SUBLINGUAL | 0 refills | Status: DC | PRN
Start: 2020-12-15 — End: 2021-06-01

## 2020-12-15 MED ORDER — TOLTERODINE TARTRATE ER 4 MG PO CP24
4.0000 mg | ORAL_CAPSULE | Freq: Every day | ORAL | 0 refills | Status: DC | PRN
Start: 2020-12-15 — End: 2021-06-01

## 2020-12-15 MED ORDER — PANTOPRAZOLE SODIUM 40 MG PO TBEC
40.0000 mg | DELAYED_RELEASE_TABLET | Freq: Every day | ORAL | 3 refills | Status: DC
Start: 2020-12-15 — End: 2021-06-02

## 2020-12-15 MED ORDER — TETANUS-DIPHTH-ACELL PERTUSSIS 5-2.5-18.5 LF-MCG/0.5 IM SUSP: 0.5000 mL | Freq: Once | INTRAMUSCULAR | 0 refills | Status: AC

## 2020-12-15 MED ORDER — ZOSTER VAC RECOMB ADJUVANTED 50 MCG/0.5ML IM SUSR: 0.5000 mL | Freq: Once | INTRAMUSCULAR | 1 refills | Status: AC

## 2020-12-15 NOTE — Progress Notes (Signed)
This visit occurred during the SARS-CoV-2 public health emergency.  Safety protocols were in place, including screening questions prior to the visit, additional usage of staff PPE, and extensive cleaning of exam room while observing appropriate contact time as indicated for disinfecting solutions.    Patient ID: Victoria Bryant, female  DOB: [birth year], 75 y.o.   MRN: [id number] Patient Care Team    Relationship Specialty Notifications Start End  [name], DO PCP - General Family Medicine  06/18/16   [name], MD Consulting Physician Obstetrics and Gynecology  10/06/16   [name], MD (Inactive) Consulting Physician Gastroenterology  10/08/16   Ladene Artist, MD Consulting Physician Gastroenterology  10/24/18   Lyndal Pulley, DO Consulting Physician Family Medicine  10/24/18   Allyn Kenner, MD  Dermatology  10/24/18     Chief Complaint  Patient presents with  . Annual Exam    Pt is fasting    Subjective: Victoria Bryant is a 75 y.o.  Female  present for CPE/cmc. All past medical history, surgical history, allergies, family history, immunizations, medications and social history were updated in the electronic medical record today. All recent labs, ED visits and hospitalizations within the last year were reviewed.  Health maintenance:  Dexa scan-09/27/2016, normal. Will confirm with [facility]. [facility]-Colonoscopy >> No additional screening indicated per Dr. sTark Mammogram (50-74): 12/2019.  Completed [facility]- Dr. Tiburcio Bash. > scheduled 01/05/2021 Immunizations: Tetanus immunization is due- printed, prescription printed for her and mailed.  Influenza vaccination up-to-date 2020.  Shingrix prescription printed and mailed to her.  Pneumonia vaccinations completed. shingrix printed.  Infectious disease screening: Infectious disease screenings completed DEXA: Bone density completed 11/2018 repeat in 5 years. Glaucoma screen: UTD Assistive device: none Oxygen  ULA:GTXM Patient has a Dental home. Hospitalizations/ED visits: reviewed   Depression with anxiety/sleep disturbance: Patient reports she is doing well on lexapro. Not needing xanax refilled.   Atherosclerosis of aorta ([place]) She has not been able to exercise and is concerned her cholesterol might be elevated.   GERD: condition is stable on protonix daily. Unable to wean off without recurrent symptoms.  .  Pain emptying bladder:  She reports detrol La is helpful.   Prediabetes She has not been able to exercise, but tries to watch her diet.   Scoliosis, left sciatica lumbar region degenerative disease:  Reports conditions is doing well. Taking mobic daily and using baclofen PRN Continue baclofen twice daily.PRN Continue  Mobic daily.   Depression screen [facility] 2/9 12/15/2020 11/26/2020 11/19/2019 10/24/2018 03/15/2018  Decreased Interest 0 0 0 0 0  Down, Depressed, Hopeless 0 0 0 0 0  PHQ - 2 Score 0 0 0 0 0  Altered sleeping - - - 0 -  Tired, decreased energy - - - 0 -  Change in appetite - - - 0 -  Feeling bad or failure about yourself  - - - 0 -  Trouble concentrating - - - 0 -  Moving slowly or fidgety/restless - - - 0 -  Suicidal thoughts - - - 0 -  PHQ-9 Score - - - 0 -  Difficult doing work/chores - - - Not difficult at all -   GAD 7 : Generalized Anxiety Score 06/09/2017  Nervous, Anxious, on Edge 3  Control/stop worrying 3  Worry too much - different things 3  Trouble relaxing 3  Restless 1  Easily annoyed or irritable 3  Afraid - awful might happen 3  Total GAD 7 Score 19  Anxiety Difficulty Somewhat difficult    Immunization History  Administered Date(s) Administered  . Fluad Quad(high Dose 65+) 08/07/2019, 08/22/2020  . Influenza Split 09/20/2011, 09/25/2012  . Influenza Whole 08/27/2010  . Influenza, High Dose Seasonal PF 10/06/2015, 12/26/2017, 09/08/2018  . Influenza,inj,Quad PF,6+ Mos 09/26/2013  . Influenza-Unspecified 09/17/2014  . Moderna  Sars-Covid-2 Vaccination 02/16/2020, 03/15/2020, 10/03/2020  . Pneumococcal Conjugate-13 10/06/2015  . Pneumococcal Polysaccharide-23 09/20/2011  . Td 05/26/2009  . Zoster 09/25/2012    Past Medical History:  Diagnosis Date  . Arthritis   . COLONIC POLYPS, HX OF 04/21/2007  . DEPRESSION 05/26/2009  . Diverticulitis   . Epiretinal membrane, left eye   . GERD 04/08/2008  . Glaucoma suspect of both eyes    [facility]  . Interstitial cystitis    ? per pt: was told by urologist she has this  . 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 MENOPAUSAL SYNDROME 04/21/2007  . NECK PAIN, CHRONIC 04/21/2007  . Posterior vitreous detachment of both eyes    [facility]  . Pseudophakia of both eyes    [facility]  . Unspecified disorder of refraction    both eyes   Allergies  Allergen Reactions  . Naproxen Diarrhea  . Paroxetine     REACTION: unspecified  . Promethazine     Hyperactive/jittery/insomnia  . Clarithromycin Nausea Only  . Doxycycline Hyclate Nausea And Vomiting  . Erythromycin Ethylsuccinate Nausea Only  . Lexapro [Escitalopram Oxalate] Other (See Comments)    Paresthesia    Past Surgical History:  Procedure Laterality Date  . COLONOSCOPY  x 2   First time polyps, second time no polyps: recall 10 yrs (next is approx 2024 per pt report)  . INCISION AND DRAINAGE ABSCESS ANAL    . POLYPECTOMY    . TONSILLECTOMY    . UPPER GASTROINTESTINAL ENDOSCOPY    . VAGINAL HYSTERECTOMY     Nonmalignant reason.  Ovaries still in.   Family History  Problem Relation Age of Onset  . Bladder Cancer Mother   . Lung cancer Mother   . Breast cancer Paternal Aunt   . ALS Daughter   . Fibromyalgia Sister   . Heart disease Brother   . Alcohol abuse Brother   . Colon cancer Neg Hx   . Colon polyps Neg Hx   . Esophageal cancer Neg Hx   . Rectal cancer Neg Hx   . Stomach cancer Neg Hx    Social History   Social History Narrative   Married,  one daughter lives next door to her.   Retired Art gallery manager.   Tob: 20 pack yr hx, quit age 51.   No alc.    Allergies as of 12/15/2020      Reactions   Naproxen Diarrhea   Paroxetine    REACTION: unspecified   Promethazine    Hyperactive/jittery/insomnia   Clarithromycin Nausea Only   Doxycycline Hyclate Nausea And Vomiting   Erythromycin Ethylsuccinate Nausea Only   Lexapro [escitalopram Oxalate] Other (See Comments)   Paresthesia      Medication List       Accurate as of December 15, 2020 10:55 AM. If you have any questions, ask your nurse or doctor.        STOP taking these medications   cefdinir 300 MG capsule Commonly known as: OMNICEF Stopped by: Howard Pouch, DO   guaiFENesin-codeine 100-10 MG/5ML syrup Commonly known as: ROBITUSSIN AC Stopped by: Howard Pouch, DO     TAKE  these medications   ALPRAZolam 0.5 MG tablet Commonly known as: Xanax Take 1 tablet (0.5 mg total) by mouth 2 (two) times daily as needed for anxiety.   Baclofen 5 MG Tabs Take 5 mg by mouth 2 (two) times daily as needed.   fluticasone 50 MCG/ACT nasal spray Commonly known as: FLONASE Place 1 spray into both nostrils daily.   gabapentin 100 MG capsule Commonly known as: NEURONTIN Take 1 capsule (100 mg total) by mouth 3 (three) times daily.   HAIR/SKIN/NAILS PO Take by mouth.   hyoscyamine 0.125 MG SL tablet Commonly known as: LEVSIN SL Place 1 tablet (0.125 mg total) under the tongue every 8 (eight) hours as needed.   JUICE PLUS FIBRE PO Take by mouth.   meloxicam 15 MG tablet Commonly known as: MOBIC Take 1 tablet (15 mg total) by mouth daily.   pantoprazole 40 MG tablet Commonly known as: PROTONIX Take 1 tablet (40 mg total) by mouth daily.   RA PROBIOTIC GUMMIES PO Take by mouth as needed.   Tdap 5-2.5-18.5 LF-MCG/0.5 injection Commonly known as: BOOSTRIX Inject 0.5 mLs into the muscle once for 1 dose. Started by: Howard Pouch, DO   tolterodine 4  MG 24 hr capsule Commonly known as: DETROL LA Take 1 capsule (4 mg total) by mouth daily as needed.   TURMERIC PO Take by mouth.   VITAMIN B 12 PO Take by mouth.   vitamin C 1000 MG tablet Take 1,000 mg by mouth daily.   Vitamin D (Cholecalciferol) 25 MCG (1000 UT) Caps Take 2 capsules by mouth daily.   Zoster Vaccine Adjuvanted injection Commonly known as: SHINGRIX Inject 0.5 mLs into the muscle once for 1 dose. Started by: Howard Pouch, DO       All past medical history, surgical history, allergies, family history, immunizations andmedications were updated in the EMR today and reviewed under the history and medication portions of their EMR.     No results found for this or any previous visit (from the past 2160 hour(s)).   ROS: 14 pt review of systems performed and negative (unless mentioned in an HPI)  Objective: BP 138/73   Pulse 75   Temp 98.5 F (36.9 C) (Oral)   Ht 5' 0.5" (1.537 m)   Wt 149 lb (67.6 kg)   SpO2 97%   BMI 28.62 kg/m  Gen: Afebrile. No acute distress. Nontoxic in appearance, well-developed, well-nourished,  Pleasant female HENT: AT. [place]. Bilateral TM visualized and normal in appearance, normal external auditory canal. MMM, no oral lesions, adequate dentition. Bilateral nares within normal limits. Throat without erythema, ulcerations or exudates. no Cough on exam, no hoarseness on exam. Eyes:Pupils Equal Round Reactive to light, Extraocular movements intact,  Conjunctiva without redness, discharge or icterus. Neck/lymp/endocrine: Supple,no lymphadenopathy, no thyromegaly CV: RRR no murmur, no edema, +2/4 P posterior tibialis pulses.  Chest: CTAB, no wheeze, rhonchi or crackles. normal Respiratory effort. good Air movement. Abd: Soft. NTND. BS present. no Masses palpated. No hepatosplenomegaly. No rebound tenderness or guarding. Skin: no rashes, purpura or petechiae. Warm and well-perfused. Skin intact. Neuro/Msk:  Normal gait. PERLA. EOMi. Alert.  Oriented x3.  Cranial nerves II through XII intact. Muscle strength 5/5 upper/lower extremity. DTRs equal bilaterally. Psych: Normal affect, dress and demeanor. Normal speech. Normal thought content and judgment.   No exam data present  Assessment/plan: Victoria Bryant is a 75 y.o. female present for CPE/cmc Depression with anxiety Stable.  Will need face-to-face visit if needing additional refills for  this medication.  Patient reports understanding. [place] controlled substance database reviewed 12/15/20 - Alprazolam> refills not needed. - continue lexapro  tsh collected today Atherosclerosis of aorta ([place]) -Consider low-dose statin for cardiovascular benefits.  Will discuss after lipids are resulted.  Cholesterol has always been very well controlled without medications. -cmp - lipid  GERD: Stable.  Continue Protonix 40 mg daily.  Pain emptying bladder:  Stable.  continue Detrol LA.   Prediabetes Continue to watch dietary habits and perform routine exercise. - a1c collected today - cmp  Scoliosis, left sciatica lumbar region degenerative disease:  Stable.  Continue baclofen twice daily.PRN Continue  Mobic daily.  Sleep disturbance Uses Xanax nightly when having difficulty sleeping secondary to anxiety. Does not refills at this time.   Encounter for preventive health physical:  Patient was encouraged to exercise greater than 150 minutes a week. Patient was encouraged to choose a diet filled with fresh fruits and vegetables, and lean meats. AVS provided to patient today for education/recommendation on gender specific health and safety maintenance. Dexa scan-09/27/2016, normal. Will confirm with [facility]. [facility]CS-Colonoscopy >> No additional screening indicated per Dr. sTark Mammogram (50-74): 12/2019.  Completed [facility]- Dr. Tiburcio Bash. > scheduled 01/05/2021 Immunizations: Tetanus immunization is due- printed, prescription printed for her and mailed.   Influenza vaccination up-to-date 2020.  Shingrix prescription printed and mailed to her.  Pneumonia vaccinations completed. shingrix printed.  Infectious disease screening: Infectious disease screenings completed DEXA: Bone density completed 11/2018 repeat in 5 years.  Return in about 24 weeks (around 06/01/2021) for [place] (30 min) and 1 yr cpe/cmc.  Orders Placed This Encounter  Procedures  . CBC with Differential/Platelet  . Comprehensive metabolic panel  . Hemoglobin A1c  . Lipid panel  . TSH   Meds ordered this encounter  Medications  . Tdap (BOOSTRIX) 5-2.5-18.5 LF-MCG/0.5 injection    Sig: Inject 0.5 mLs into the muscle once for 1 dose.    Dispense:  0.5 mL    Refill:  0  . Zoster Vaccine Adjuvanted [facility]) injection    Sig: Inject 0.5 mLs into the muscle once for 1 dose.    Dispense:  0.5 mL    Refill:  1  . tolterodine (DETROL LA) 4 MG 24 hr capsule    Sig: Take 1 capsule (4 mg total) by mouth daily as needed.    Dispense:  90 capsule    Refill:  0  . pantoprazole (PROTONIX) 40 MG tablet    Sig: Take 1 tablet (40 mg total) by mouth daily.    Dispense:  90 tablet    Refill:  3  . DISCONTD: meloxicam (MOBIC) 15 MG tablet    Sig: Take 1 tablet (15 mg total) by mouth daily.    Dispense:  90 tablet    Refill:  3  . hyoscyamine (LEVSIN SL) 0.125 MG SL tablet    Sig: Place 1 tablet (0.125 mg total) under the tongue every 8 (eight) hours as needed.    Dispense:  90 tablet    Refill:  0  . gabapentin (NEURONTIN) 100 MG capsule    Sig: Take 1 capsule (100 mg total) by mouth 3 (three) times daily.    Dispense:  270 capsule    Refill:  1  . Baclofen 5 MG TABS    Sig: Take 5 mg by mouth 2 (two) times daily as needed.    Dispense:  180 tablet    Refill:  1  . meloxicam (MOBIC) 15 MG tablet  Sig: Take 1 tablet (15 mg total) by mouth daily.    Dispense:  90 tablet    Refill:  3   Referral Orders  No referral(s) requested today     Electronically signed by: Howard Pouch, [place]

## 2020-12-15 NOTE — Patient Instructions (Signed)
Health Maintenance After Age 76 After age 76, you are at a higher risk for certain long-term diseases and infections as well as injuries from falls. Falls are a major cause of broken bones and head injuries in people who are older than age 76. Getting regular preventive care can help to keep you healthy and well. Preventive care includes getting regular testing and making lifestyle changes as recommended by your health care provider. Talk with your health care provider about:  Which screenings and tests you should have. A screening is a test that checks for a disease when you have no symptoms.  A diet and exercise plan that is right for you. What should I know about screenings and tests to prevent falls? Screening and testing are the best ways to find a health problem early. Early diagnosis and treatment give you the best chance of managing medical conditions that are common after age 76. Certain conditions and lifestyle choices may make you more likely to have a fall. Your health care provider may recommend:  Regular vision checks. Poor vision and conditions such as cataracts can make you more likely to have a fall. If you wear glasses, make sure to get your prescription updated if your vision changes.  Medicine review. Work with your health care provider to regularly review all of the medicines you are taking, including over-the-counter medicines. Ask your health care provider about any side effects that may make you more likely to have a fall. Tell your health care provider if any medicines that you take make you feel dizzy or sleepy.  Osteoporosis screening. Osteoporosis is a condition that causes the bones to get weaker. This can make the bones weak and cause them to break more easily.  Blood pressure screening. Blood pressure changes and medicines to control blood pressure can make you feel dizzy.  Strength and balance checks. Your health care provider may recommend certain tests to check your  strength and balance while standing, walking, or changing positions.  Foot health exam. Foot pain and numbness, as well as not wearing proper footwear, can make you more likely to have a fall.  Depression screening. You may be more likely to have a fall if you have a fear of falling, feel emotionally low, or feel unable to do activities that you used to do.  Alcohol use screening. Using too much alcohol can affect your balance and may make you more likely to have a fall. What actions can I take to lower my risk of falls? General instructions  Talk with your health care provider about your risks for falling. Tell your health care provider if: ? You fall. Be sure to tell your health care provider about all falls, even ones that seem minor. ? You feel dizzy, sleepy, or off-balance.  Take over-the-counter and prescription medicines only as told by your health care provider. These include any supplements.  Eat a healthy diet and maintain a healthy weight. A healthy diet includes low-fat dairy products, low-fat (lean) meats, and fiber from whole grains, beans, and lots of fruits and vegetables. Home safety  Remove any tripping hazards, such as rugs, cords, and clutter.  Install safety equipment such as grab bars in bathrooms and safety rails on stairs.  Keep rooms and walkways well-lit. Activity  Follow a regular exercise program to stay fit. This will help you maintain your balance. Ask your health care provider what types of exercise are appropriate for you.  If you need a cane or walker,   use it as recommended by your health care provider.  Wear supportive shoes that have nonskid soles.   Lifestyle  Do not drink alcohol if your health care provider tells you not to drink.  If you drink alcohol, limit how much you have: ? 0-1 drink a day for women. ? 0-2 drinks a day for men.  Be aware of how much alcohol is in your drink. In the [place] one drink equals one typical bottle of beer (12  oz), one-half glass of wine (5 oz), or one shot of hard liquor (1 oz).  Do not use any products that contain nicotine or tobacco, such as cigarettes and e-cigarettes. If you need help quitting, ask your health care provider. Summary  Having a healthy lifestyle and getting preventive care can help to protect your health and wellness after age 76.  Screening and testing are the best way to find a health problem early and help you avoid having a fall. Early diagnosis and treatment give you the best chance for managing medical conditions that are more common for people who are older than age 76.  Falls are a major cause of broken bones and head injuries in people who are older than age 76. Take precautions to prevent a fall at home.  Work with your health care provider to learn what changes you can make to improve your health and wellness and to prevent falls. This information is not intended to replace advice given to you by your health care provider. Make sure you discuss any questions you have with your health care provider. Document Revised: 03/15/2019 Document Reviewed: 10/05/2017 Elsevier Patient Education  2021 Elsevier Inc.  

## 2020-12-16 DIAGNOSIS — M25551 Pain in right hip: Secondary | ICD-10-CM | POA: Diagnosis not present

## 2020-12-16 LAB — CBC WITH DIFFERENTIAL/PLATELET
Absolute Monocytes: 459 cells/uL (ref 200–950)
Basophils Absolute: 51 cells/uL (ref 0–200)
Basophils Relative: 0.6 %
Eosinophils Absolute: 493 cells/uL (ref 15–500)
Eosinophils Relative: 5.8 %
HCT: 39.2 % (ref 35.0–45.0)
Hemoglobin: 13.6 g/dL (ref 11.7–15.5)
Lymphs Abs: 3188 cells/uL (ref 850–3900)
MCH: 31.3 pg (ref 27.0–33.0)
MCHC: 34.7 g/dL (ref 32.0–36.0)
MCV: 90.3 fL (ref 80.0–100.0)
MPV: 9.7 fL (ref 7.5–12.5)
Monocytes Relative: 5.4 %
Neutro Abs: 4310 cells/uL (ref 1500–7800)
Neutrophils Relative %: 50.7 %
Platelets: 248 10*3/uL (ref 140–400)
RBC: 4.34 10*6/uL (ref 3.80–5.10)
RDW: 11.9 % (ref 11.0–15.0)
Total Lymphocyte: 37.5 %
WBC: 8.5 10*3/uL (ref 3.8–10.8)

## 2020-12-16 LAB — LIPID PANEL
Cholesterol: 219 mg/dL — ABNORMAL HIGH (ref <200)
HDL: 68 mg/dL (ref >=50)
LDL Cholesterol (Calc): 123 mg/dL (calc) — ABNORMAL HIGH
Non-HDL Cholesterol (Calc): 151 mg/dL (calc) — ABNORMAL HIGH (ref <130)
Total CHOL/HDL Ratio: 3.2 (calc) (ref <5.0)
Triglycerides: 162 mg/dL — ABNORMAL HIGH (ref <150)

## 2020-12-16 LAB — HEMOGLOBIN A1C
Hgb A1c MFr Bld: 5.7 % of total Hgb — ABNORMAL HIGH (ref <5.7)
Mean Plasma Glucose: 117 mg/dL
eAG (mmol/L): 6.5 mmol/L

## 2020-12-16 LAB — COMPREHENSIVE METABOLIC PANEL
AG Ratio: 1.7 (calc) (ref 1.0–2.5)
ALT: 13 U/L (ref 6–29)
AST: 17 U/L (ref 10–35)
Albumin: 4.2 g/dL (ref 3.6–5.1)
Alkaline phosphatase (APISO): 77 U/L (ref 37–153)
BUN: 10 mg/dL (ref 7–25)
CO2: 28 mmol/L (ref 20–32)
Calcium: 9.4 mg/dL (ref 8.6–10.4)
Chloride: 105 mmol/L (ref 98–110)
Creat: 0.68 mg/dL (ref 0.60–0.93)
Globulin: 2.5 g/dL (calc) (ref 1.9–3.7)
Glucose, Bld: 91 mg/dL (ref 65–99)
Potassium: 4.1 mmol/L (ref 3.5–5.3)
Sodium: 142 mmol/L (ref 135–146)
Total Bilirubin: 0.5 mg/dL (ref 0.2–1.2)
Total Protein: 6.7 g/dL (ref 6.1–8.1)

## 2020-12-16 LAB — TSH: TSH: 3.26 mIU/L (ref 0.40–4.50)

## 2020-12-17 ENCOUNTER — Telehealth: Payer: Self-pay | Admitting: Family Medicine

## 2020-12-17 MED ORDER — ROSUVASTATIN CALCIUM 10 MG PO TABS: 10.0000 mg | ORAL_TABLET | Freq: Every day | ORAL | 3 refills | Status: DC

## 2020-12-17 NOTE — Telephone Encounter (Signed)
-----   Message from Octaviano Glow, [place] sent at 12/17/2020 10:25 AM EST ----- Pt has agreed to start statin medication. Advised to call if she feels bad with the medication. Pt states ahe has a headache today and spoke with PCP about headache and is almost certain that she has a sinus infection. Would like to have antibiotics for sinus infection.

## 2020-12-17 NOTE — Telephone Encounter (Signed)
Spoke with pt regarding rx and sx. Pt verbalized understanding

## 2020-12-17 NOTE — Telephone Encounter (Signed)
Please instruct patient that I have called in the statin medication. She did not have a sinus infection on her exam yesterday.  I would not recommend antibiotics given her exam was normal.  If her symptoms persist past 7 days, or are worsening, would then advise her to follow-up at that time to consider evaluation, testing and medication if appropriate  Thanks

## 2020-12-18 DIAGNOSIS — M25551 Pain in right hip: Secondary | ICD-10-CM | POA: Diagnosis not present

## 2020-12-30 DIAGNOSIS — M25551 Pain in right hip: Secondary | ICD-10-CM | POA: Diagnosis not present

## 2021-01-01 DIAGNOSIS — M25551 Pain in right hip: Secondary | ICD-10-CM | POA: Diagnosis not present

## 2021-01-05 DIAGNOSIS — Z23 Encounter for immunization: Secondary | ICD-10-CM | POA: Diagnosis not present

## 2021-01-05 DIAGNOSIS — Z1231 Encounter for screening mammogram for malignant neoplasm of breast: Secondary | ICD-10-CM | POA: Diagnosis not present

## 2021-01-05 LAB — HM MAMMOGRAPHY

## 2021-01-06 DIAGNOSIS — M25551 Pain in right hip: Secondary | ICD-10-CM | POA: Diagnosis not present

## 2021-01-07 DIAGNOSIS — J019 Acute sinusitis, unspecified: Secondary | ICD-10-CM | POA: Diagnosis not present

## 2021-01-07 DIAGNOSIS — Z03818 Encounter for observation for suspected exposure to other biological agents ruled out: Secondary | ICD-10-CM | POA: Diagnosis not present

## 2021-01-08 DIAGNOSIS — M25551 Pain in right hip: Secondary | ICD-10-CM | POA: Diagnosis not present

## 2021-01-13 DIAGNOSIS — M25551 Pain in right hip: Secondary | ICD-10-CM | POA: Diagnosis not present

## 2021-01-15 DIAGNOSIS — M25551 Pain in right hip: Secondary | ICD-10-CM | POA: Diagnosis not present

## 2021-01-20 DIAGNOSIS — Z85820 Personal history of malignant melanoma of skin: Secondary | ICD-10-CM | POA: Diagnosis not present

## 2021-01-20 DIAGNOSIS — Z08 Encounter for follow-up examination after completed treatment for malignant neoplasm: Secondary | ICD-10-CM | POA: Diagnosis not present

## 2021-01-20 DIAGNOSIS — Z1283 Encounter for screening for malignant neoplasm of skin: Secondary | ICD-10-CM | POA: Diagnosis not present

## 2021-01-20 DIAGNOSIS — D225 Melanocytic nevi of trunk: Secondary | ICD-10-CM | POA: Diagnosis not present

## 2021-01-21 DIAGNOSIS — M25551 Pain in right hip: Secondary | ICD-10-CM | POA: Diagnosis not present

## 2021-01-22 DIAGNOSIS — M25551 Pain in right hip: Secondary | ICD-10-CM | POA: Diagnosis not present

## 2021-01-27 DIAGNOSIS — M25551 Pain in right hip: Secondary | ICD-10-CM | POA: Diagnosis not present

## 2021-02-09 DIAGNOSIS — R1032 Left lower quadrant pain: Secondary | ICD-10-CM | POA: Diagnosis not present

## 2021-02-09 DIAGNOSIS — R350 Frequency of micturition: Secondary | ICD-10-CM | POA: Diagnosis not present

## 2021-03-26 ENCOUNTER — Other Ambulatory Visit (HOSPITAL_BASED_OUTPATIENT_CLINIC_OR_DEPARTMENT_OTHER): Payer: Self-pay

## 2021-03-26 ENCOUNTER — Other Ambulatory Visit: Payer: Self-pay

## 2021-03-26 ENCOUNTER — Encounter (HOSPITAL_BASED_OUTPATIENT_CLINIC_OR_DEPARTMENT_OTHER): Payer: Self-pay

## 2021-03-26 ENCOUNTER — Emergency Department (HOSPITAL_BASED_OUTPATIENT_CLINIC_OR_DEPARTMENT_OTHER)
Admission: EM | Admit: 2021-03-26 | Discharge: 2021-03-26 | Disposition: A | Discharge status: Home / Self Care | Payer: Medicare Other | Attending: Emergency Medicine | Admitting: Emergency Medicine

## 2021-03-26 DIAGNOSIS — N3090 Cystitis, unspecified without hematuria: Secondary | ICD-10-CM | POA: Insufficient documentation

## 2021-03-26 DIAGNOSIS — Z87891 Personal history of nicotine dependence: Secondary | ICD-10-CM | POA: Insufficient documentation

## 2021-03-26 DIAGNOSIS — B9689 Other specified bacterial agents as the cause of diseases classified elsewhere: Secondary | ICD-10-CM | POA: Diagnosis not present

## 2021-03-26 DIAGNOSIS — N3001 Acute cystitis with hematuria: Secondary | ICD-10-CM | POA: Diagnosis not present

## 2021-03-26 DIAGNOSIS — R319 Hematuria, unspecified: Secondary | ICD-10-CM | POA: Diagnosis present

## 2021-03-26 LAB — URINALYSIS, ROUTINE W REFLEX MICROSCOPIC
Bilirubin Urine: NEGATIVE
Glucose, UA: NEGATIVE mg/dL
Ketones, ur: NEGATIVE mg/dL
Nitrite: NEGATIVE
Protein, ur: NEGATIVE mg/dL
Specific Gravity, Urine: <1.005 — ABNORMAL LOW (ref 1.005–1.030)
pH: 6.5 (ref 5.0–8.0)

## 2021-03-26 LAB — URINALYSIS, MICROSCOPIC (REFLEX): Squamous Epithelial / HPF: NONE SEEN (ref 0–5)

## 2021-03-26 MED ORDER — CEPHALEXIN 500 MG PO CAPS
500.0000 mg | ORAL_CAPSULE | Freq: Two times a day (BID) | ORAL | 0 refills | Status: AC
  Filled 2021-03-26: qty 14, 7d supply, fill #0

## 2021-03-26 NOTE — ED Provider Notes (Signed)
[facility] EMERGENCY DEPARTMENT Provider Note   CSN: [id number] Arrival date & time: 03/26/21  0800     History Chief Complaint  Patient presents with  . Hematuria    ABBIEGAIL Bryant is a 75 y.o. female.  The history is provided by the patient.  Hematuria This is a new problem. The current episode started yesterday. The problem occurs rarely. The problem has been rapidly improving. Pertinent negatives include no abdominal pain. Nothing aggravates the symptoms. Nothing relieves the symptoms. She has tried nothing for the symptoms. The treatment provided no relief.       Past Medical History:  Diagnosis Date  . Arthritis   . COLONIC POLYPS, HX OF 04/21/2007  . DEPRESSION 05/26/2009  . Diverticulitis   . Epiretinal membrane, left eye   . GERD 04/08/2008  . Glaucoma suspect of both eyes    [facility]  . Interstitial cystitis    ? per pt: was told by urologist she has this  . 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 MENOPAUSAL SYNDROME 04/21/2007  . NECK PAIN, CHRONIC 04/21/2007  . Posterior vitreous detachment of both eyes    [facility]  . Pseudophakia of both eyes    [facility]  . Unspecified disorder of refraction    both eyes    Patient Active Problem List   Diagnosis Date Noted  . Aortic atherosclerosis ([place]) 08/27/2020  . Liver hemangioma 08/27/2020  . Combined forms of age-related cataract of left eye 07/19/2019  . Regular astigmatism, left eye 07/19/2019  . Interstitial cystitis   . Bilateral carpal tunnel syndrome 09/12/2018  . Pain emptying bladder 06/22/2018  . Scoliosis of lumbar region due to degenerative disease of spine in adult 05/31/2018  . Atherosclerosis of aorta ([place]) 05/31/2018  . Sciatica of right side associated with disorder of lumbar spine 05/04/2018  . Numbness and tingling of right leg 05/04/2018  . Sleep disturbance 06/30/2017  . Elevated TSH 06/09/2017  . Diverticulosis of  intestine without bleeding 06/09/2017  . Prediabetes 04/14/2017  . Depression with anxiety 05/26/2009  . GERD 04/08/2008  . History of colonic polyps 04/21/2007    Past Surgical History:  Procedure Laterality Date  . COLONOSCOPY  x 2   First time polyps, second time no polyps: recall 10 yrs (next is approx 2024 per pt report)  . INCISION AND DRAINAGE ABSCESS ANAL    . POLYPECTOMY    . TONSILLECTOMY    . UPPER GASTROINTESTINAL ENDOSCOPY    . VAGINAL HYSTERECTOMY     Nonmalignant reason.  Ovaries still in.     OB History   No obstetric history on file.     Family History  Problem Relation Age of Onset  . Bladder Cancer Mother   . Lung cancer Mother   . Breast cancer Paternal Aunt   . ALS Daughter   . Fibromyalgia Sister   . Heart disease Brother   . Alcohol abuse Brother   . Colon cancer Neg Hx   . Colon polyps Neg Hx   . Esophageal cancer Neg Hx   . Rectal cancer Neg Hx   . Stomach cancer Neg Hx     Social History   Tobacco Use  . Smoking status: Former Smoker    Quit date: 12/06/1973    Years since quitting: 47.3  . Smokeless tobacco: Never Used  Vaping Use  . Vaping Use: Never used  Substance Use Topics  . Alcohol use: No  . Drug  use: No    Home Medications Prior to Admission medications   Medication Sig Start Date End Date Taking? Authorizing Provider  cephALEXin (KEFLEX) 500 MG capsule Take 1 capsule (500 mg total) by mouth 2 (two) times daily for 7 days. 03/26/21 04/02/21 Yes Symphonie Schneiderman, DO  ALPRAZolam Duanne Moron) 0.5 MG tablet Take 1 tablet (0.5 mg total) by mouth 2 (two) times daily as needed for anxiety. 12/26/19   Kuneff, Renee A, DO  Ascorbic Acid (VITAMIN C) 1000 MG tablet Take 1,000 mg by mouth daily.    [provider]  Baclofen 5 MG TABS Take 5 mg by mouth 2 (two) times daily as needed. 12/15/20   Kuneff, Renee A, DO  Biotin w/ Vitamins C & E (HAIR/SKIN/NAILS PO) Take by mouth.    [provider]  Cyanocobalamin (VITAMIN B 12 PO)  Take by mouth.    [provider]  fluticasone (FLONASE) 50 MCG/ACT nasal spray Place 1 spray into both nostrils daily. 05/30/20   Kuneff, Renee A, DO  gabapentin (NEURONTIN) 100 MG capsule Take 1 capsule (100 mg total) by mouth 3 (three) times daily. 12/15/20   Kuneff, Renee A, DO  hyoscyamine (LEVSIN SL) 0.125 MG SL tablet Place 1 tablet (0.125 mg total) under the tongue every 8 (eight) hours as needed. 12/15/20   Kuneff, Renee A, DO  meloxicam (MOBIC) 15 MG tablet Take 1 tablet (15 mg total) by mouth daily. 12/15/20   Kuneff, Renee A, DO  Nutritional Supplements (JUICE PLUS FIBRE PO) Take by mouth.    [provider]  pantoprazole (PROTONIX) 40 MG tablet Take 1 tablet (40 mg total) by mouth daily. 12/15/20   Kuneff, Renee A, DO  Probiotic Product (RA PROBIOTIC GUMMIES PO) Take by mouth as needed.    [provider]  rosuvastatin (CRESTOR) 10 MG tablet Take 1 tablet (10 mg total) by mouth daily. 12/17/20   Kuneff, Renee A, DO  tolterodine (DETROL LA) 4 MG 24 hr capsule Take 1 capsule (4 mg total) by mouth daily as needed. 12/15/20   Kuneff, Renee A, DO  TURMERIC PO Take by mouth.    [provider]  Vitamin D, Cholecalciferol, 25 MCG (1000 UT) CAPS Take 2 capsules by mouth daily.    [provider]    Allergies    Naproxen, Paroxetine, Promethazine, Clarithromycin, Doxycycline hyclate, Erythromycin ethylsuccinate, and Lexapro [escitalopram oxalate]  Review of Systems   Review of Systems  Constitutional: Negative for chills and fever.  Gastrointestinal: Negative for abdominal pain and vomiting.  Genitourinary: Positive for hematuria. Negative for decreased urine volume, difficulty urinating, dysuria, flank pain, frequency, urgency, vaginal bleeding and vaginal discharge.  All other systems reviewed and are negative.   Physical Exam Updated Vital Signs BP (!) 164/72 (BP Location: Right Arm)   Pulse 69   Temp 98.4 F (36.9 C) (Oral)   Resp 16   Ht  5\' 2"  (1.575 m)   Wt 63.5 kg   SpO2 99%   BMI 25.61 kg/m   Physical Exam Vitals and nursing note reviewed.  Constitutional:      General: She is not in acute distress.    Appearance: She is well-developed. She is not ill-appearing.  HENT:     Head: Normocephalic and atraumatic.     Nose: Nose normal.     Mouth/Throat:     Mouth: Mucous membranes are moist.  Eyes:     Conjunctiva/sclera: Conjunctivae normal.  Pulmonary:     Effort: Pulmonary effort is  normal. No respiratory distress.     Breath sounds: Normal breath sounds.  Abdominal:     General: Abdomen is flat.     Palpations: Abdomen is soft.     Tenderness: There is no abdominal tenderness. There is no right CVA tenderness or left CVA tenderness.  Musculoskeletal:        General: Normal range of motion.  Skin:    General: Skin is warm and dry.  Neurological:     Mental Status: She is alert.     ED Results / Procedures / Treatments   Labs (all labs ordered are listed, but only abnormal results are displayed) Labs Reviewed  URINE CULTURE  URINALYSIS, ROUTINE W REFLEX MICROSCOPIC    EKG None  Radiology No results found.  Procedures Procedures   Medications Ordered in ED Medications - No data to display  ED Course  I have reviewed the triage vital signs and the nursing notes.  Pertinent labs & imaging results that were available during my care of the patient were reviewed by me and considered in my medical decision making (see chart for details).    MDM Rules/Calculators/A&P                          Victoria Bryant is here with hematuria, abdominal pain.  Normal vitals.  No fever.  Overall hematuria and pain improved since last night.  History of interstitial cystitis.  Not on blood thinners.  Denies any fevers.  No history of kidney stones.  Does not have any flank pain.  Abdominal exam is overall benign.  Patient very well-appearing.  Suspect possible UTI versus small kidney stone/chronic  interstitial cystitis.  Will prescribe Keflex.  Urine culture has been sent.  We will have her follow-up with urology.  No smoking history and have lower suspicion for cancerous process.  Understands return precautions including urinary retention, fever, worsening pain.  This chart was dictated using voice recognition software.  Despite best efforts to proofread,  errors can occur which can change the documentation meaning.   Final Clinical Impression(s) / ED Diagnoses Final diagnoses:  Hematuria, unspecified type  Cystitis    Rx / DC Orders ED Discharge Orders         Ordered    cephALEXin (KEFLEX) 500 MG capsule  2 times daily        03/26/21 0830           Lennice Sites, DO 03/26/21 0831

## 2021-03-26 NOTE — ED Triage Notes (Signed)
Pt arrives ambulatory to ED with c/o hematuria, "pure blood during the night and pain I may have passed a stone" Pt has not be diagnosed with a stone.

## 2021-03-26 NOTE — Discharge Instructions (Addendum)
Follow-up with urology.  Please return if you develop any symptoms of urinary retention, worsening pain, fever.  Suspect this is inflammation from urine infection or small kidney stone.

## 2021-03-28 LAB — URINE CULTURE: Culture: >=100000 — AB

## 2021-03-29 ENCOUNTER — Telehealth: Payer: Self-pay

## 2021-03-29 NOTE — Progress Notes (Signed)
ED Antimicrobial Stewardship Positive Culture Follow Up   Victoria Bryant is an 75 y.o. female who presented to [facility] on 03/26/2021 with a chief complaint of hematuria Chief Complaint  Patient presents with  . Hematuria    Recent Results (from the past 720 hour(s))  Urine culture     Status: Abnormal   Collection Time: 03/26/21  8:04 AM   Specimen: Urine, Random  Result Value Ref Range Status   Specimen Description   Final    URINE, RANDOM Performed at [facility], [address] [place],  [address]    Special Requests   Final    NONE Performed at [facility], [address] [place], [place] [address]    Culture (A)  Final    >=100,000 COLONIES/mL KLEBSIELLA PNEUMONIAE Confirmed Extended Spectrum Beta-Lactamase Producer (ESBL).  In bloodstream infections from ESBL organisms, carbapenems are preferred over piperacillin/tazobactam. They are shown to have a lower risk of mortality.    Report Status 03/28/2021 FINAL  Final   Organism ID, Bacteria KLEBSIELLA PNEUMONIAE (A)  Final      Susceptibility   Klebsiella pneumoniae - MIC*    AMPICILLIN >=32 RESISTANT Resistant     CEFAZOLIN >=64 RESISTANT Resistant     CEFEPIME >=32 RESISTANT Resistant     CEFTRIAXONE >=64 RESISTANT Resistant     CIPROFLOXACIN 2 INTERMEDIATE Intermediate     GENTAMICIN <=1 SENSITIVE Sensitive     IMIPENEM <=0.25 SENSITIVE Sensitive     NITROFURANTOIN 128 RESISTANT Resistant     TRIMETH/SULFA >=320 RESISTANT Resistant     AMPICILLIN/SULBACTAM >=32 RESISTANT Resistant     PIP/TAZO 16 SENSITIVE Sensitive     * >=100,000 COLONIES/mL KLEBSIELLA PNEUMONIAE    [x]  Treated with cephalexin, organism resistant to prescribed antimicrobial []  Patient discharged originally without antimicrobial agent and treatment is now indicated  New antibiotic prescription: Fosfomycin 3 gm every 2 days x 3 doses. If symptoms do not improve, patient instructed to return to ER.   ED  Provider: Darnell Level, PharmD., BCPS, BCCCP Clinical Pharmacist Please refer to [facility] for unit-specific pharmacist

## 2021-03-29 NOTE — Telephone Encounter (Signed)
Post ED Visit - Positive Culture Follow-up: Unsuccessful Patient Follow-up  Culture assessed and recommendations reviewed by:  []  Elenor Quinones, Pharm.D. []  Heide Guile, Pharm.D., BCPS AQ-ID []  Parks Neptune, Pharm.D., BCPS []  Alycia Rossetti, Pharm.D., BCPS []  [place], Pharm.D., BCPS, AAHIVP []  Legrand Como, Pharm.D., BCPS, AAHIVP []  Wynell Balloon, PharmD []  Vincenza Hews, PharmD, BCPS Sheral Apley  Pharm D Positive urine culture  []  Patient discharged without antimicrobial prescription and treatment is now indicated [x]  Organism is resistant to prescribed ED discharge antimicrobial []  Patient with positive blood cultures PT needs Fosfomycin 3 gm every 2 days x 3 doses  Stop Cephalexin Return to ED if not better after Fosfomycin  Unable to contact patient after 3 attempts, letter will be sent to address on file  Genia Del 03/29/2021, 11:54 AM

## 2021-04-06 ENCOUNTER — Telehealth: Payer: Self-pay | Admitting: Emergency Medicine

## 2021-04-06 NOTE — Telephone Encounter (Signed)
Pt returned call in response to letter, instructed to stop cephalexin, start fosfomycin x 3 doses, requested called to [facility] [phone]

## 2021-04-30 DIAGNOSIS — R310 Gross hematuria: Secondary | ICD-10-CM | POA: Diagnosis not present

## 2021-05-06 DIAGNOSIS — M19071 Primary osteoarthritis, right ankle and foot: Secondary | ICD-10-CM | POA: Diagnosis not present

## 2021-06-01 ENCOUNTER — Ambulatory Visit: Payer: Medicare Other | Admitting: Family Medicine

## 2021-06-02 ENCOUNTER — Ambulatory Visit (INDEPENDENT_AMBULATORY_CARE_PROVIDER_SITE_OTHER): Payer: Medicare Other | Admitting: Family Medicine

## 2021-06-02 ENCOUNTER — Other Ambulatory Visit: Payer: Self-pay

## 2021-06-02 ENCOUNTER — Encounter: Payer: Self-pay | Admitting: Family Medicine

## 2021-06-02 VITALS — BP 129/66 | HR 77 | Temp 98.4°F | Ht 61.02 in | Wt 151.0 lb

## 2021-06-02 DIAGNOSIS — M4186 Other forms of scoliosis, lumbar region: Secondary | ICD-10-CM | POA: Diagnosis not present

## 2021-06-02 DIAGNOSIS — R252 Cramp and spasm: Secondary | ICD-10-CM | POA: Diagnosis not present

## 2021-06-02 DIAGNOSIS — Z79899 Other long term (current) drug therapy: Secondary | ICD-10-CM | POA: Diagnosis not present

## 2021-06-02 DIAGNOSIS — M5386 Other specified dorsopathies, lumbar region: Secondary | ICD-10-CM | POA: Diagnosis not present

## 2021-06-02 DIAGNOSIS — R7989 Other specified abnormal findings of blood chemistry: Secondary | ICD-10-CM

## 2021-06-02 DIAGNOSIS — N3010 Interstitial cystitis (chronic) without hematuria: Secondary | ICD-10-CM | POA: Diagnosis not present

## 2021-06-02 DIAGNOSIS — R7303 Prediabetes: Secondary | ICD-10-CM

## 2021-06-02 DIAGNOSIS — G479 Sleep disorder, unspecified: Secondary | ICD-10-CM | POA: Diagnosis not present

## 2021-06-02 DIAGNOSIS — K219 Gastro-esophageal reflux disease without esophagitis: Secondary | ICD-10-CM | POA: Diagnosis not present

## 2021-06-02 DIAGNOSIS — F418 Other specified anxiety disorders: Secondary | ICD-10-CM | POA: Diagnosis not present

## 2021-06-02 DIAGNOSIS — M4156 Other secondary scoliosis, lumbar region: Secondary | ICD-10-CM

## 2021-06-02 DIAGNOSIS — I700 Atherosclerosis of aorta: Secondary | ICD-10-CM

## 2021-06-02 LAB — COMPREHENSIVE METABOLIC PANEL
ALT: 16 U/L (ref 0–35)
AST: 18 U/L (ref 0–37)
Albumin: 4.5 g/dL (ref 3.5–5.2)
Alkaline Phosphatase: 81 U/L (ref 39–117)
BUN: 16 mg/dL (ref 6–23)
CO2: 26 mEq/L (ref 19–32)
Calcium: 9.5 mg/dL (ref 8.4–10.5)
Chloride: 104 mEq/L (ref 96–112)
Creatinine, Ser: 0.81 mg/dL (ref 0.40–1.20)
GFR: 70.71 mL/min (ref >60.00)
Glucose, Bld: 91 mg/dL (ref 70–99)
Potassium: 4 mEq/L (ref 3.5–5.1)
Sodium: 140 mEq/L (ref 135–145)
Total Bilirubin: 0.6 mg/dL (ref 0.2–1.2)
Total Protein: 6.9 g/dL (ref 6.0–8.3)

## 2021-06-02 LAB — LIPID PANEL
Cholesterol: 152 mg/dL (ref 0–200)
HDL: 85.3 mg/dL (ref >39.00)
LDL Cholesterol: 53 mg/dL (ref 0–99)
NonHDL: 66.3
Total CHOL/HDL Ratio: 2
Triglycerides: 68 mg/dL (ref 0.0–149.0)
VLDL: 13.6 mg/dL (ref 0.0–40.0)

## 2021-06-02 LAB — TSH: TSH: 4.97 u[IU]/mL — ABNORMAL HIGH (ref 0.35–4.50)

## 2021-06-02 LAB — VITAMIN B12: Vitamin B-12: 596 pg/mL (ref 211–911)

## 2021-06-02 LAB — HEMOGLOBIN A1C: Hgb A1c MFr Bld: 5.9 % (ref 4.6–6.5)

## 2021-06-02 LAB — VITAMIN D 25 HYDROXY (VIT D DEFICIENCY, FRACTURES): VITD: 45.83 ng/mL (ref 30.00–100.00)

## 2021-06-02 LAB — MAGNESIUM: Magnesium: 1.8 mg/dL (ref 1.5–2.5)

## 2021-06-02 MED ORDER — ZOSTER VAC RECOMB ADJUVANTED 50 MCG/0.5ML IM SUSR: 0.5000 mL | Freq: Once | INTRAMUSCULAR | 1 refills | Status: AC

## 2021-06-02 MED ORDER — MELOXICAM 15 MG PO TABS: 15.0000 mg | ORAL_TABLET | Freq: Every day | ORAL | 3 refills | Status: DC

## 2021-06-02 MED ORDER — HYOSCYAMINE SULFATE 0.125 MG SL SUBL: 0.1250 mg | SUBLINGUAL_TABLET | Freq: Three times a day (TID) | SUBLINGUAL | 1 refills | Status: DC | PRN

## 2021-06-02 MED ORDER — BACLOFEN 10 MG PO TABS: 5.0000 mg | ORAL_TABLET | Freq: Two times a day (BID) | ORAL | 1 refills | Status: DC

## 2021-06-02 MED ORDER — ZOSTER VAC RECOMB ADJUVANTED 50 MCG/0.5ML IM SUSR: 0.5000 mL | Freq: Once | INTRAMUSCULAR | 1 refills | Status: DC

## 2021-06-02 MED ORDER — TOLTERODINE TARTRATE ER 4 MG PO CP24: 4.0000 mg | ORAL_CAPSULE | Freq: Every day | ORAL | 1 refills | Status: DC | PRN

## 2021-06-02 MED ORDER — GABAPENTIN 100 MG PO CAPS: 100.0000 mg | ORAL_CAPSULE | Freq: Every day | ORAL | 1 refills | Status: DC

## 2021-06-02 MED ORDER — ROSUVASTATIN CALCIUM 10 MG PO TABS: 10.0000 mg | ORAL_TABLET | Freq: Every day | ORAL | 3 refills | Status: DC

## 2021-06-02 MED ORDER — ALPRAZOLAM 0.5 MG PO TABS: 0.5000 mg | ORAL_TABLET | Freq: Two times a day (BID) | ORAL | 1 refills | Status: DC | PRN

## 2021-06-02 MED ORDER — FLUTICASONE PROPIONATE 50 MCG/ACT NA SUSP: 1.0000 | Freq: Every day | NASAL | 3 refills | Status: DC

## 2021-06-02 MED ORDER — PANTOPRAZOLE SODIUM 40 MG PO TBEC: 40.0000 mg | DELAYED_RELEASE_TABLET | Freq: Every day | ORAL | 3 refills | Status: DC

## 2021-06-02 NOTE — Progress Notes (Signed)
This visit occurred during the SARS-CoV-2 public health emergency.  Safety protocols were in place, including screening questions prior to the visit, additional usage of staff PPE, and extensive cleaning of exam room while observing appropriate contact time as indicated for disinfecting solutions.    Patient ID: Victoria Bryant, female  DOB: [birth year], 75 y.o.   MRN: [id number] Patient Care Team    Relationship Specialty Notifications Start End  [name], DO PCP - General Family Medicine  06/18/16   [name], MD Consulting Physician Obstetrics and Gynecology  10/06/16   [name], MD (Inactive) Consulting Physician Gastroenterology  10/08/16   Ladene Artist, MD Consulting Physician Gastroenterology  10/24/18   Lyndal Pulley, DO Consulting Physician Family Medicine  10/24/18   Allyn Kenner, MD  Dermatology  10/24/18     Chief Complaint  Patient presents with   Depression    [facility]; pt is fasting     Subjective: Victoria Bryant is a 75 y.o.  Female  present for  Depression with anxiety/sleep disturbance: Patient reports she is doing  well on lexapro. She will also need xanax refills today. She takes very infrequently.   Atherosclerosis of aorta ([place]) She has not been able to exercise due to [facility] issue. She has been watching her diet and tolerating start of crestor low dose daily.   GERD: condition is stable on protonix daily. Unable to wean off without recurrent symptoms. Recently ran out of med and had to buy OTC. She is taking b12 and vit d. She is having new leg cramps the last few months.   Pain emptying bladder/urinary incontinence:  She reports detrol La is helpful. She is having to use daily- she is having leakage.   Prediabetes She has not been able to exercise, but tries to watch her diet. She is having trouble with her foot and recently had steroid injection. She hopes it allows her to get back to her exercise.   Scoliosis, left sciatica lumbar region  degenerative disease:  Reports conditions is overall doing well. Taking mobic daily and using baclofen PRN- has had trouble getting baclofen from [place].   Depression screen [facility] 2/9 12/15/2020 11/26/2020 11/19/2019 10/24/2018 03/15/2018  Decreased Interest 0 0 0 0 0  Down, Depressed, Hopeless 0 0 0 0 0  PHQ - 2 Score 0 0 0 0 0  Altered sleeping - - - 0 -  Tired, decreased energy - - - 0 -  Change in appetite - - - 0 -  Feeling bad or failure about yourself  - - - 0 -  Trouble concentrating - - - 0 -  Moving slowly or fidgety/restless - - - 0 -  Suicidal thoughts - - - 0 -  PHQ-9 Score - - - 0 -  Difficult doing work/chores - - - Not difficult at all -   GAD 7 : Generalized Anxiety Score 06/09/2017  Nervous, Anxious, on Edge 3  Control/stop worrying 3  Worry too much - different things 3  Trouble relaxing 3  Restless 1  Easily annoyed or irritable 3  Afraid - awful might happen 3  Total GAD 7 Score 19  Anxiety Difficulty Somewhat difficult    Immunization History  Administered Date(s) Administered   Fluad Quad(high Dose 65+) 08/07/2019, 08/22/2020   Influenza Split 09/20/2011, 09/25/2012   Influenza Whole 08/27/2010   Influenza, High Dose Seasonal PF 10/06/2015, 12/26/2017, 09/08/2018   Influenza,inj,Quad PF,6+ Mos 09/26/2013   Influenza-Unspecified 09/17/2014  Moderna Sars-Covid-2 Vaccination 02/16/2020, 03/15/2020, 10/03/2020   Pneumococcal Conjugate-13 10/06/2015   Pneumococcal Polysaccharide-23 09/20/2011   Td 05/26/2009   Tdap 04/05/2021   Zoster, Live 09/25/2012    Past Medical History:  Diagnosis Date   Arthritis    COLONIC POLYPS, HX OF 04/21/2007   DEPRESSION 05/26/2009   Diverticulitis    Epiretinal membrane, left eye    GERD 04/08/2008   Glaucoma suspect of both eyes    [facility]   Interstitial cystitis    ? 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/2007   MENOPAUSAL SYNDROME 04/21/2007   NECK PAIN,  CHRONIC 04/21/2007   Posterior vitreous detachment of both eyes    [facility]   Pseudophakia of both eyes    [facility]   Unspecified disorder of refraction    both eyes   Allergies  Allergen Reactions   Naproxen Diarrhea   Paroxetine     REACTION: unspecified   Promethazine     Hyperactive/jittery/insomnia   Clarithromycin Nausea Only   Doxycycline Hyclate Nausea And Vomiting   Erythromycin Ethylsuccinate Nausea Only   Lexapro [Escitalopram Oxalate] Other (See Comments)    Paresthesia    Past Surgical History:  Procedure Laterality Date   COLONOSCOPY  x 2   First time polyps, second time no polyps: recall 10 yrs (next is approx 2024 per pt report)   INCISION AND DRAINAGE ABSCESS ANAL     POLYPECTOMY     TONSILLECTOMY     UPPER GASTROINTESTINAL ENDOSCOPY     VAGINAL HYSTERECTOMY     Nonmalignant reason.  Ovaries still in.   Family History  Problem Relation Age of Onset   Bladder Cancer Mother    Lung cancer Mother    Breast cancer Paternal 71    ALS Daughter    Fibromyalgia Sister    Heart disease Brother    Alcohol abuse Brother    Colon cancer Neg Hx    Colon polyps Neg Hx    Esophageal cancer Neg Hx    Rectal cancer Neg Hx    Stomach cancer Neg Hx    Social History   Social History Narrative   Married, one daughter lives next door to her.   Retired Art gallery manager.   Tob: 20 pack yr hx, quit age 16.   No alc.    Allergies as of 06/02/2021       Reactions   Naproxen Diarrhea   Paroxetine    REACTION: unspecified   Promethazine    Hyperactive/jittery/insomnia   Clarithromycin Nausea Only   Doxycycline Hyclate Nausea And Vomiting   Erythromycin Ethylsuccinate Nausea Only   Lexapro [escitalopram Oxalate] Other (See Comments)   Paresthesia        Medication List        Accurate as of June 02, 2021 10:27 AM. If you have any questions, ask your nurse or doctor.          ALPRAZolam 0.5 MG  tablet Commonly known as: Xanax Take 1 tablet (0.5 mg total) by mouth 2 (two) times daily as needed for anxiety.   baclofen 10 MG tablet Commonly known as: LIORESAL Take 0.5 tablets (5 mg total) by mouth 2 (two) times daily. What changed:  medication strength when to take this reasons to take this Changed by: Howard Pouch, DO   fluticasone 50 MCG/ACT nasal spray Commonly known as: FLONASE Place 1 spray into both nostrils daily.  gabapentin 100 MG capsule Commonly known as: NEURONTIN Take 1 capsule (100 mg total) by mouth at bedtime. What changed: when to take this Changed by: Howard Pouch, DO   HAIR/SKIN/NAILS PO Take by mouth.   hyoscyamine 0.125 MG SL tablet Commonly known as: LEVSIN SL Place 1 tablet (0.125 mg total) under the tongue every 8 (eight) hours as needed.   JUICE PLUS FIBRE PO Take by mouth.   meloxicam 15 MG tablet Commonly known as: MOBIC Take 1 tablet (15 mg total) by mouth daily.   pantoprazole 40 MG tablet Commonly known as: PROTONIX Take 1 tablet (40 mg total) by mouth daily.   RA PROBIOTIC GUMMIES PO Take by mouth as needed.   rosuvastatin 10 MG tablet Commonly known as: Crestor Take 1 tablet (10 mg total) by mouth daily.   tolterodine 4 MG 24 hr capsule Commonly known as: DETROL LA Take 1 capsule (4 mg total) by mouth daily as needed.   TURMERIC PO Take by mouth.   VITAMIN B 12 PO Take by mouth.   vitamin C 1000 MG tablet Take 1,000 mg by mouth daily.   Vitamin D (Cholecalciferol) 25 MCG (1000 UT) Caps Take 2 capsules by mouth daily.   Zoster Vaccine Adjuvanted injection Commonly known as: SHINGRIX Inject 0.5 mLs into the muscle once for 1 dose. Rpt once in 2-6 mos. Started by: Howard Pouch, DO        All past medical history, surgical history, allergies, family history, immunizations andmedications were updated in the EMR today and reviewed under the history and medication portions of their EMR.       ROS: 14 pt  review of systems performed and negative (unless mentioned in an HPI)  Objective: BP 129/66   Pulse 77   Temp 98.4 F (36.9 C) (Oral)   Ht 5' 1.02" (1.55 m)   Wt 151 lb (68.5 kg)   SpO2 96%   BMI 28.51 kg/m  Gen: Afebrile. No acute distress.  HENT: AT. [place].  Eyes:Pupils Equal Round Reactive to light, Extraocular movements intact,  Conjunctiva without redness, discharge or icterus. Neck/lymp/endocrine: Supple,no lymphadenopathy, no thyromegaly CV: RRR no murmur. No edema Chest: CTAB, no wheeze or crackles Abd: Soft. NTND. BS present Skin: no rashes, purpura or petechiae.  Neuro:  Normal gait. PERLA. EOMi. Alert. Oriented x3 Psych: Normal affect, dress and demeanor. Normal speech. Normal thought content and judgment.   No results found.  Assessment/plan: Victoria Bryant is a 75 y.o. female present for CPE/cmc Depression with anxiety Stable.  Will need face-to-face visit if needing additional refills for this medication.  Patient reports understanding. [place] controlled substance database reviewed 06/02/21 - Alprazolam> refilled for her today - continue lexapro   Atherosclerosis of aorta (HCC) -tolerating crestor 10 mg qd. - rpt CMP and lipid today.   GERD/long term PPI therapy: Stable. Continue Protonix 40 mg daily. Mag, vit d and b12 collected today  Pain emptying bladder/urinary incontinence:  Stable.  Continue Detrol LA.   Prediabetes Continue to watch dietary habits and perform routine exercise. - a1c collected today  Scoliosis, left sciatica lumbar region degenerative disease:  Stable.  Continue  baclofen 1/2 tab BID PRN Continue  Mobic daily.  Sleep disturbance Uses Xanax nightly when having difficulty sleeping secondary to anxiety. Continue xanax PRN. [place] reviewed.  Elevated TSH Possible cause of leg cramps. Will recheck today . - TSH  Leg cramps Worsening over last few months- she has not been taking the gabapentin. Encouraged  her to restart gabapentin QHS- this will help with cramps. Will ensure electrolytes and long term PPI use/vit def are not the cause.  - Comprehensive metabolic panel - Vitamin D (25 hydroxy) - B12 - Magnesium - Iron, TIBC and Ferritin Panel   Return in about 23 weeks (around 11/10/2021) for [facility] (30 min).  Orders Placed This Encounter  Procedures   Lipid panel   Comprehensive metabolic panel   Vitamin D (25 hydroxy)   B12   Magnesium   Iron, TIBC and Ferritin Panel   Hemoglobin A1c   TSH    Meds ordered this encounter  Medications   gabapentin (NEURONTIN) 100 MG capsule    Sig: Take 1 capsule (100 mg total) by mouth at bedtime.    Dispense:  90 capsule    Refill:  1   hyoscyamine (LEVSIN SL) 0.125 MG SL tablet    Sig: Place 1 tablet (0.125 mg total) under the tongue every 8 (eight) hours as needed.    Dispense:  90 tablet    Refill:  1   tolterodine (DETROL LA) 4 MG 24 hr capsule    Sig: Take 1 capsule (4 mg total) by mouth daily as needed.    Dispense:  90 capsule    Refill:  1   meloxicam (MOBIC) 15 MG tablet    Sig: Take 1 tablet (15 mg total) by mouth daily.    Dispense:  90 tablet    Refill:  3   baclofen (LIORESAL) 10 MG tablet    Sig: Take 0.5 tablets (5 mg total) by mouth 2 (two) times daily.    Dispense:  90 each    Refill:  1   fluticasone (FLONASE) 50 MCG/ACT nasal spray    Sig: Place 1 spray into both nostrils daily.    Dispense:  48 g    Refill:  3   pantoprazole (PROTONIX) 40 MG tablet    Sig: Take 1 tablet (40 mg total) by mouth daily.    Dispense:  90 tablet    Refill:  3   rosuvastatin (CRESTOR) 10 MG tablet    Sig: Take 1 tablet (10 mg total) by mouth daily.    Dispense:  90 tablet    Refill:  3   DISCONTD: Zoster Vaccine Adjuvanted [facility]) injection    Sig: Inject 0.5 mLs into the muscle once for 1 dose. Rpt once in 2-6 mos.    Dispense:  0.5 mL    Refill:  1   DISCONTD: Zoster Vaccine Adjuvanted [facility]) injection    Sig: Inject 0.5  mLs into the muscle once for 1 dose. Rpt once in 2-6 mos.    Dispense:  0.5 mL    Refill:  1   Zoster Vaccine Adjuvanted [facility]) injection    Sig: Inject 0.5 mLs into the muscle once for 1 dose. Rpt once in 2-6 mos.    Dispense:  0.5 mL    Refill:  1   ALPRAZolam (XANAX) 0.5 MG tablet    Sig: Take 1 tablet (0.5 mg total) by mouth 2 (two) times daily as needed for anxiety.    Dispense:  180 tablet    Refill:  1    Referral Orders  No referral(s) requested today     Electronically signed by: Howard Pouch, [place]

## 2021-06-02 NOTE — Patient Instructions (Signed)
Great to see you today.  I have refilled the medication(s) we provide.   If labs were collected, we will inform you of lab results once received either by echart message or telephone call.   - echart message- for normal results that have been seen by the patient already.   - telephone call: abnormal results or if patient has not viewed results in their echart.   Next appt in 5.5 months, unless needed sooner.

## 2021-06-03 LAB — IRON,TIBC AND FERRITIN PANEL
%SAT: 31 % (calc) (ref 16–45)
Ferritin: 100 ng/mL (ref 16–288)
Iron: 91 ug/dL (ref 45–160)
TIBC: 294 mcg/dL (calc) (ref 250–450)

## 2021-06-04 ENCOUNTER — Telehealth: Payer: Self-pay | Admitting: Family Medicine

## 2021-06-04 DIAGNOSIS — E039 Hypothyroidism, unspecified: Secondary | ICD-10-CM | POA: Insufficient documentation

## 2021-06-04 MED ORDER — LEVOTHYROXINE SODIUM 25 MCG PO TABS: 12.5000 ug | ORAL_TABLET | Freq: Every day | ORAL | 3 refills | Status: DC

## 2021-06-04 MED ORDER — LEVOTHYROXINE SODIUM 25 MCG PO TABS: 12.5000 ug | ORAL_TABLET | Freq: Every day | ORAL | 0 refills | Status: DC

## 2021-06-04 NOTE — Telephone Encounter (Signed)
Please inform patient the following information: Liver and kidney function, blood cell counts, iron levels are all normal. Cholesterol panel looks great and is at goal with an LDL of 53. Vitamin D levels look great at 45. B12 is in normal range.  Thyroid levels are mildly abnormal again.   He consistently has had thyroid levels that are borderline hyperthyroid levels for the last couple years, with occasional abnormal thyroid levels. Since she is noticing leg cramps, I believe she would benefit from starting a very low-dose thyroid supplement to maintain her levels in the mid normal range for TSH.  I have [facility] this [facility] for [facility], I also called in a 2-week prescription to [facility] so that she can start this right away.  This medication is to be taken in the morning on an empty stomach, and avoid eating for at least 30 to 60 minutes after taking.  Do not take Protonix until least 60 minutes after this medication.  Also if she is still taking the biotin supplement for hair and nails, I would encourage her to discontinue this for now.  She can restart at a later date after we are able to recheck her thyroid levels on this medication.  Biotin interferes with the advanced thyroid panel that we would need to collect on her in 6 weeks.  Please schedule her for lab appointment only in 6 weeks, nonfasting-for a full thyroid panel to ensure dose is adequate.   Orders have been placed Thanks

## 2021-06-04 NOTE — Telephone Encounter (Signed)
Spoke with pt regarding labs and instructions.   

## 2021-07-07 ENCOUNTER — Encounter: Payer: Self-pay | Admitting: Family Medicine

## 2021-07-07 NOTE — Telephone Encounter (Signed)
Please advise 

## 2021-07-14 ENCOUNTER — Ambulatory Visit (INDEPENDENT_AMBULATORY_CARE_PROVIDER_SITE_OTHER): Payer: Medicare Other

## 2021-07-14 ENCOUNTER — Other Ambulatory Visit: Payer: Self-pay

## 2021-07-14 DIAGNOSIS — E039 Hypothyroidism, unspecified: Secondary | ICD-10-CM

## 2021-07-15 ENCOUNTER — Other Ambulatory Visit: Payer: Self-pay | Admitting: Family Medicine

## 2021-07-15 LAB — TSH: TSH: 4.02 mIU/L (ref 0.40–4.50)

## 2021-07-15 LAB — THYROID PEROXIDASE ANTIBODY: Thyroperoxidase Ab SerPl-aCnc: 1 IU/mL (ref <9)

## 2021-07-15 LAB — T3, FREE: T3, Free: 3.6 pg/mL (ref 2.3–4.2)

## 2021-07-15 LAB — T4, FREE: Free T4: 1.1 ng/dL (ref 0.8–1.8)

## 2021-11-05 DIAGNOSIS — U071 COVID-19: Secondary | ICD-10-CM

## 2021-11-05 HISTORY — DX: COVID-19: U07.1

## 2021-11-10 ENCOUNTER — Other Ambulatory Visit: Payer: Self-pay

## 2021-11-10 ENCOUNTER — Ambulatory Visit (INDEPENDENT_AMBULATORY_CARE_PROVIDER_SITE_OTHER): Payer: Medicare Other | Admitting: Family Medicine

## 2021-11-10 ENCOUNTER — Encounter: Payer: Self-pay | Admitting: Family Medicine

## 2021-11-10 VITALS — BP 145/69 | HR 71 | Temp 98.4°F | Ht 61.0 in | Wt 149.0 lb

## 2021-11-10 DIAGNOSIS — M5386 Other specified dorsopathies, lumbar region: Secondary | ICD-10-CM

## 2021-11-10 DIAGNOSIS — R200 Anesthesia of skin: Secondary | ICD-10-CM

## 2021-11-10 DIAGNOSIS — B9689 Other specified bacterial agents as the cause of diseases classified elsewhere: Secondary | ICD-10-CM | POA: Insufficient documentation

## 2021-11-10 DIAGNOSIS — R309 Painful micturition, unspecified: Secondary | ICD-10-CM

## 2021-11-10 DIAGNOSIS — J329 Chronic sinusitis, unspecified: Secondary | ICD-10-CM

## 2021-11-10 DIAGNOSIS — K219 Gastro-esophageal reflux disease without esophagitis: Secondary | ICD-10-CM | POA: Diagnosis not present

## 2021-11-10 DIAGNOSIS — G479 Sleep disorder, unspecified: Secondary | ICD-10-CM | POA: Diagnosis not present

## 2021-11-10 DIAGNOSIS — F418 Other specified anxiety disorders: Secondary | ICD-10-CM | POA: Diagnosis not present

## 2021-11-10 DIAGNOSIS — R7989 Other specified abnormal findings of blood chemistry: Secondary | ICD-10-CM

## 2021-11-10 DIAGNOSIS — N3010 Interstitial cystitis (chronic) without hematuria: Secondary | ICD-10-CM | POA: Diagnosis not present

## 2021-11-10 DIAGNOSIS — R202 Paresthesia of skin: Secondary | ICD-10-CM

## 2021-11-10 DIAGNOSIS — M4156 Other secondary scoliosis, lumbar region: Secondary | ICD-10-CM | POA: Diagnosis not present

## 2021-11-10 DIAGNOSIS — Z79899 Other long term (current) drug therapy: Secondary | ICD-10-CM | POA: Diagnosis not present

## 2021-11-10 DIAGNOSIS — I700 Atherosclerosis of aorta: Secondary | ICD-10-CM | POA: Diagnosis not present

## 2021-11-10 MED ORDER — TOLTERODINE TARTRATE ER 4 MG PO CP24: 4.0000 mg | ORAL_CAPSULE | Freq: Every day | ORAL | 1 refills | Status: DC | PRN

## 2021-11-10 MED ORDER — BACLOFEN 10 MG PO TABS: 5.0000 mg | ORAL_TABLET | Freq: Two times a day (BID) | ORAL | 1 refills | Status: DC

## 2021-11-10 MED ORDER — PANTOPRAZOLE SODIUM 40 MG PO TBEC: 40.0000 mg | DELAYED_RELEASE_TABLET | Freq: Every day | ORAL | 3 refills | Status: DC

## 2021-11-10 MED ORDER — AMOXICILLIN-POT CLAVULANATE 875-125 MG PO TABS: 1.0000 | ORAL_TABLET | Freq: Two times a day (BID) | ORAL | 0 refills | Status: DC

## 2021-11-10 MED ORDER — MELOXICAM 15 MG PO TABS: 15.0000 mg | ORAL_TABLET | Freq: Every day | ORAL | 3 refills | Status: DC

## 2021-11-10 MED ORDER — FLUTICASONE PROPIONATE 50 MCG/ACT NA SUSP: 1.0000 | Freq: Every day | NASAL | 3 refills | Status: DC

## 2021-11-10 MED ORDER — ALPRAZOLAM 0.5 MG PO TABS: 0.5000 mg | ORAL_TABLET | Freq: Two times a day (BID) | ORAL | 1 refills | Status: DC | PRN

## 2021-11-10 NOTE — Progress Notes (Addendum)
This visit occurred during the SARS-CoV-2 public health emergency.  Safety protocols were in place, including screening questions prior to the visit, additional usage of staff PPE, and extensive cleaning of exam room while observing appropriate contact time as indicated for disinfecting solutions.    Patient ID: Victoria Bryant, female  DOB: 1946/03/26, 75 y.o.   MRN: [id number] Patient Care Team    Relationship Specialty Notifications Start End  [name], DO PCP - General Family Medicine  06/18/16   [name], MD Consulting Physician Obstetrics and Gynecology  10/06/16   [name], MD (Inactive) Consulting Physician Gastroenterology  10/08/16   Ladene Artist, MD Consulting Physician Gastroenterology  10/24/18   Lyndal Pulley, DO Consulting Physician Family Medicine  10/24/18   Allyn Kenner, MD  Dermatology  10/24/18     Chief Complaint  Patient presents with   Hyperlipidemia    [place]; pt is fasting     Subjective: Victoria Bryant is a 75 y.o.  Female  present for  Depression with anxiety/sleep disturbance: Patient reports she is doing well  on lexapro. She will also need xanax refills today. She takes very infrequently- only when having difficulty falling asleep.   Atherosclerosis of aorta ([place]) She has not been able to exercise due to [facility] issue. She has been watching her diet.   GERD: condition is stable on protonix daily. Unable to wean off without recurrent symptoms. She is supplementing b12 and vit d.   Pain emptying bladder/urinary incontinence:  She reports detrol La is helpful. She is having to use daily- she is having leakage.   Prediabetes She has not been able to exercise, but tries to watch her diet. She is having trouble with her foot and recently had steroid injection. She hopes it allows her to get back to her exercise.   Scoliosis/left sciatica lumbar region degenerative disease:  Reports conditions is overall doing well. Taking mobic daily  and using baclofen PRN- has had trouble getting baclofen from [place] in the past  Abnormal TSH: Mildly elevated tsh- pt reported stomach upset with levo low dose. Most likely from water taken on empty stomach with her h/o GERD. Agreed to monitor w/out meds for now and consider restarting only if TSH rises or pt has symptoms.   Depression screen [facility] 2/9 11/10/2021 12/15/2020 11/26/2020 11/19/2019 10/24/2018  Decreased Interest 0 0 0 0 0  Down, Depressed, Hopeless 0 0 0 0 0  PHQ - 2 Score 0 0 0 0 0  Altered sleeping 0 - - - 0  Tired, decreased energy 0 - - - 0  Change in appetite 0 - - - 0  Feeling bad or failure about yourself  0 - - - 0  Trouble concentrating 0 - - - 0  Moving slowly or fidgety/restless 0 - - - 0  Suicidal thoughts 0 - - - 0  PHQ-9 Score 0 - - - 0  Difficult doing work/chores - - - - Not difficult at all  Some recent data might be hidden   GAD 7 : Generalized Anxiety Score 11/10/2021 06/09/2017  Nervous, Anxious, on Edge 0 3  Control/stop worrying 0 3  Worry too much - different things 0 3  Trouble relaxing 0 3  Restless 0 1  Easily annoyed or irritable 0 3  Afraid - awful might happen 0 3  Total GAD 7 Score 0 19  Anxiety Difficulty - Somewhat difficult    Immunization History  Administered Date(s) Administered  Fluad Quad(high Dose 65+) 08/07/2019, 08/22/2020   Influenza Split 09/20/2011, 09/25/2012   Influenza Whole 08/27/2010   Influenza, High Dose Seasonal PF 10/06/2015, 12/26/2017, 09/08/2018   Influenza,inj,Quad PF,6+ Mos 09/26/2013   Influenza-Unspecified 09/17/2014, 09/05/2021   Moderna Sars-Covid-2 Vaccination 02/16/2020, 03/15/2020, 10/03/2020   Pneumococcal Conjugate-13 10/06/2015   Pneumococcal Polysaccharide-23 09/20/2011   Td 05/26/2009   Tdap 04/05/2021   Zoster, Live 09/25/2012    Past Medical History:  Diagnosis Date   Arthritis    COLONIC POLYPS, HX OF 04/21/2007   DEPRESSION 05/26/2009   Diverticulitis    Epiretinal membrane, left eye     GERD 04/08/2008   Glaucoma suspect of both eyes    [facility]   Interstitial cystitis    ? 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/2007   MENOPAUSAL SYNDROME 04/21/2007   NECK PAIN, CHRONIC 04/21/2007   Posterior vitreous detachment of both eyes    [facility]   Pseudophakia of both eyes    [facility]   Unspecified disorder of refraction    both eyes   Allergies  Allergen Reactions   Naproxen Diarrhea   Paroxetine     REACTION: unspecified   Promethazine     Hyperactive/jittery/insomnia   Clarithromycin Nausea Only   Doxycycline Hyclate Nausea And Vomiting   Erythromycin Ethylsuccinate Nausea Only   Lexapro [Escitalopram Oxalate] Other (See Comments)    Paresthesia    Past Surgical History:  Procedure Laterality Date   COLONOSCOPY  x 2   First time polyps, second time no polyps: recall 10 yrs (next is approx 2024 per pt report)   INCISION AND DRAINAGE ABSCESS ANAL     POLYPECTOMY     TONSILLECTOMY     UPPER GASTROINTESTINAL ENDOSCOPY     VAGINAL HYSTERECTOMY     Nonmalignant reason.  Ovaries still in.   Family History  Problem Relation Age of Onset   Bladder Cancer Mother    Lung cancer Mother    Breast cancer Paternal 18    ALS Daughter    Fibromyalgia Sister    Heart disease Brother    Alcohol abuse Brother    Colon cancer Neg Hx    Colon polyps Neg Hx    Esophageal cancer Neg Hx    Rectal cancer Neg Hx    Stomach cancer Neg Hx    Social History   Social History Narrative   Married, one daughter lives next door to her.   Retired Art gallery manager.   Tob: 20 pack yr hx, quit age 60.   No alc.    Allergies as of 11/10/2021       Reactions   Naproxen Diarrhea   Paroxetine    REACTION: unspecified   Promethazine    Hyperactive/jittery/insomnia   Clarithromycin Nausea Only   Doxycycline Hyclate Nausea And Vomiting   Erythromycin Ethylsuccinate Nausea Only    Lexapro [escitalopram Oxalate] Other (See Comments)   Paresthesia        Medication List        Accurate as of November 10, 2021 10:54 AM. If you have any questions, ask your nurse or doctor.          STOP taking these medications    rosuvastatin 10 MG tablet Commonly known as: Crestor Stopped by: Howard Pouch, DO   TURMERIC PO Stopped by: Howard Pouch, DO       TAKE these medications    ALPRAZolam 0.5  MG tablet Commonly known as: Xanax Take 1 tablet (0.5 mg total) by mouth 2 (two) times daily as needed for anxiety.   amoxicillin-clavulanate 875-125 MG tablet Commonly known as: AUGMENTIN Take 1 tablet by mouth 2 (two) times daily. Started by: Howard Pouch, DO   baclofen 10 MG tablet Commonly known as: LIORESAL Take 0.5 tablets (5 mg total) by mouth 2 (two) times daily.   fluticasone 50 MCG/ACT nasal spray Commonly known as: FLONASE Place 1 spray into both nostrils daily.   gabapentin 100 MG capsule Commonly known as: NEURONTIN Take 1 capsule (100 mg total) by mouth at bedtime.   hyoscyamine 0.125 MG SL tablet Commonly known as: LEVSIN SL Place 1 tablet (0.125 mg total) under the tongue every 8 (eight) hours as needed.   JUICE PLUS FIBRE PO Take by mouth.   meloxicam 15 MG tablet Commonly known as: MOBIC Take 1 tablet (15 mg total) by mouth daily.   pantoprazole 40 MG tablet Commonly known as: PROTONIX Take 1 tablet (40 mg total) by mouth daily.   RA PROBIOTIC GUMMIES PO Take by mouth as needed.   tolterodine 4 MG 24 hr capsule Commonly known as: DETROL LA Take 1 capsule (4 mg total) by mouth daily as needed.   VITAMIN B 12 PO Take by mouth.   vitamin C 1000 MG tablet Take 1,000 mg by mouth daily.   Vitamin D (Cholecalciferol) 25 MCG (1000 UT) Caps Take 2 capsules by mouth daily.        All past medical history, surgical history, allergies, family history, immunizations andmedications were updated in the EMR today and reviewed  under the history and medication portions of their EMR.       ROS: 14 pt review of systems performed and negative (unless mentioned in an HPI)  Objective: BP (!) 145/69   Pulse 71   Temp 98.4 F (36.9 C) (Oral)   Ht 5\' 1"  (1.549 m)   Wt 149 lb (67.6 kg)   SpO2 99%   BMI 28.15 kg/m  Gen: Afebrile. No acute distress. Very pleasant female.  HENT: AT. [place]. B/L TM w/o erythema or bulging, small clear effusions presnet bilaterally. EAC normal. R nasal passage with mild erythema nad drainage. TTP b/l max sinus. No cough or hoarseness.  Eyes:Pupils Equal Round Reactive to light, Extraocular movements intact,  Conjunctiva without redness, discharge or icterus. Neck/lymp/endocrine: Supple,no lymphadenopathy, no thyromegaly CV: RRR no murmur, no edema, Chest: CTAB, no wheeze or crackles Skin: no rashes, purpura or petechiae.  Neuro: Normal gait. PERLA. EOMi. Alert. Oriented x3 Psych: Normal affect, dress and demeanor. Normal speech. Normal thought content and judgment.    No results found.  Assessment/plan: BELLINA TOKARCZYK is a 75 y.o. female present for CPE/cmc Depression with anxiety She is feeling well. She has now has a female "friend" since the passing of her husband in 2019, and feels happy. December can be tough on her since her husband passed in 11/2018 Will need face-to-face visit if needing additional refills for this medication.  Patient reports understanding. [place] controlled substance database reviewed 11/10/21 - continue Alprazolam> refilled for her today Tried: lexapro   Atherosclerosis of aorta ([place]) -did not tolerate crestor.  Will continue to monitor and consider different statin or zetia if needed.  Labs due next visit.   GERD/long term PPI therapy: Stable Continue Protonix 40 mg daily. Mag, vit d and b12 UTD 05/2021  Pain emptying bladder/urinary incontinence:  Stable Continue Detrol LA.   Prediabetes  Continue to watch dietary habits and perform  routine exercise. A1c normal- check yearly- due next appt.   Scoliosis, left sciatica lumbar region degenerative disease:  stable Continue baclofen 1/2 tab BID PRN continue Mobic daily.  Sleep disturbance Uses Xanax nightly when having difficulty sleeping secondary to anxiety. Continue xanax PRN. NCCS database reviewed  Elevated TSH Levo low dose caused stomach upset. Levels not high enough to require supplement at this time. Will continue to monitor.   Leg cramps Greatly improved.  Continue  gabapentin prn (rarely uses) Will ensure electrolytes and long term PPI use/vit def are not the cause.  - CMP, vit d, b12, mag, iron panel wnl 6 mso ago.   Bacterial sinusitis: - she has been treating with mucinex dm (which is why her BP mildly elevated today), nettie pot daily and flonase nasal spray. She continues to suffer from max sinus pressure/pain > 10 days. Rest, hydrate.  Continue flonase, mucinex (DM if cough), nettie pot or nasal saline.  Augmentin BID  prescribed, take until completed.     Return for she already has appt scheduled for jan (cpe).  No orders of the defined types were placed in this encounter.   Meds ordered this encounter  Medications   baclofen (LIORESAL) 10 MG tablet    Sig: Take 0.5 tablets (5 mg total) by mouth 2 (two) times daily.    Dispense:  90 each    Refill:  1   fluticasone (FLONASE) 50 MCG/ACT nasal spray    Sig: Place 1 spray into both nostrils daily.    Dispense:  48 g    Refill:  3   tolterodine (DETROL LA) 4 MG 24 hr capsule    Sig: Take 1 capsule (4 mg total) by mouth daily as needed.    Dispense:  90 capsule    Refill:  1   meloxicam (MOBIC) 15 MG tablet    Sig: Take 1 tablet (15 mg total) by mouth daily.    Dispense:  90 tablet    Refill:  3   pantoprazole (PROTONIX) 40 MG tablet    Sig: Take 1 tablet (40 mg total) by mouth daily.    Dispense:  90 tablet    Refill:  3   amoxicillin-clavulanate (AUGMENTIN) 875-125 MG tablet     Sig: Take 1 tablet by mouth 2 (two) times daily.    Dispense:  20 tablet    Refill:  0   ALPRAZolam (XANAX) 0.5 MG tablet    Sig: Take 1 tablet (0.5 mg total) by mouth 2 (two) times daily as needed for anxiety.    Dispense:  180 tablet    Refill:  1     Referral Orders  No referral(s) requested today     Electronically signed by:  Howard Pouch, DO [place]

## 2021-11-10 NOTE — Patient Instructions (Addendum)
    We will collect labs next visit in January.

## 2021-11-10 NOTE — Addendum Note (Signed)
Addended by: Howard Pouch A on: 11/10/2021 10:54 AM   Modules accepted: Orders

## 2021-12-05 DIAGNOSIS — R519 Headache, unspecified: Secondary | ICD-10-CM | POA: Diagnosis not present

## 2021-12-15 ENCOUNTER — Other Ambulatory Visit: Payer: Self-pay

## 2021-12-16 ENCOUNTER — Ambulatory Visit (INDEPENDENT_AMBULATORY_CARE_PROVIDER_SITE_OTHER): Payer: Medicare Other | Admitting: Family Medicine

## 2021-12-16 ENCOUNTER — Encounter: Payer: Self-pay | Admitting: Family Medicine

## 2021-12-16 VITALS — BP 123/75 | HR 73 | Temp 97.7°F | Ht 62.4 in | Wt 146.6 lb

## 2021-12-16 DIAGNOSIS — Z79899 Other long term (current) drug therapy: Secondary | ICD-10-CM

## 2021-12-16 DIAGNOSIS — Z0000 Encounter for general adult medical examination without abnormal findings: Secondary | ICD-10-CM | POA: Diagnosis not present

## 2021-12-16 DIAGNOSIS — Z13220 Encounter for screening for lipoid disorders: Secondary | ICD-10-CM

## 2021-12-16 DIAGNOSIS — R7303 Prediabetes: Secondary | ICD-10-CM

## 2021-12-16 DIAGNOSIS — R7989 Other specified abnormal findings of blood chemistry: Secondary | ICD-10-CM

## 2021-12-16 DIAGNOSIS — Z86010 Personal history of colonic polyps: Secondary | ICD-10-CM

## 2021-12-16 DIAGNOSIS — I700 Atherosclerosis of aorta: Secondary | ICD-10-CM | POA: Diagnosis not present

## 2021-12-16 LAB — TSH: TSH: 4.15 u[IU]/mL (ref 0.35–5.50)

## 2021-12-16 LAB — COMPREHENSIVE METABOLIC PANEL
ALT: 13 U/L (ref 0–35)
AST: 18 U/L (ref 0–37)
Albumin: 4.6 g/dL (ref 3.5–5.2)
Alkaline Phosphatase: 84 U/L (ref 39–117)
BUN: 15 mg/dL (ref 6–23)
CO2: 30 mEq/L (ref 19–32)
Calcium: 9.8 mg/dL (ref 8.4–10.5)
Chloride: 104 mEq/L (ref 96–112)
Creatinine, Ser: 0.69 mg/dL (ref 0.40–1.20)
GFR: 84.22 mL/min (ref >60.00)
Glucose, Bld: 98 mg/dL (ref 70–99)
Potassium: 4.9 mEq/L (ref 3.5–5.1)
Sodium: 141 mEq/L (ref 135–145)
Total Bilirubin: 0.5 mg/dL (ref 0.2–1.2)
Total Protein: 7.1 g/dL (ref 6.0–8.3)

## 2021-12-16 LAB — VITAMIN D 25 HYDROXY (VIT D DEFICIENCY, FRACTURES): VITD: 76 ng/mL (ref 30.00–100.00)

## 2021-12-16 LAB — CBC
HCT: 41.5 % (ref 36.0–46.0)
Hemoglobin: 13.4 g/dL (ref 12.0–15.0)
MCHC: 32.2 g/dL (ref 30.0–36.0)
MCV: 91 fl (ref 78.0–100.0)
Platelets: 310 10*3/uL (ref 150.0–400.0)
RBC: 4.56 Mil/uL (ref 3.87–5.11)
RDW: 12.9 % (ref 11.5–15.5)
WBC: 9.2 10*3/uL (ref 4.0–10.5)

## 2021-12-16 LAB — HEMOGLOBIN A1C: Hgb A1c MFr Bld: 5.9 % (ref 4.6–6.5)

## 2021-12-16 LAB — B12 AND FOLATE PANEL
Folate: >24.2 ng/mL (ref >5.9)
Vitamin B-12: 1113 pg/mL — ABNORMAL HIGH (ref 211–911)

## 2021-12-16 LAB — LIPID PANEL
Cholesterol: 240 mg/dL — ABNORMAL HIGH (ref 0–200)
HDL: 60.2 mg/dL (ref >39.00)
LDL Cholesterol: 156 mg/dL — ABNORMAL HIGH (ref 0–99)
NonHDL: 179.42
Total CHOL/HDL Ratio: 4
Triglycerides: 119 mg/dL (ref 0.0–149.0)
VLDL: 23.8 mg/dL (ref 0.0–40.0)

## 2021-12-16 LAB — MAGNESIUM: Magnesium: 1.9 mg/dL (ref 1.5–2.5)

## 2021-12-16 MED ORDER — ZOSTER VAC RECOMB ADJUVANTED 50 MCG/0.5ML IM SUSR: 0.5000 mL | Freq: Once | INTRAMUSCULAR | 1 refills | Status: AC

## 2021-12-16 MED ORDER — GABAPENTIN 100 MG PO CAPS: 100.0000 mg | ORAL_CAPSULE | Freq: Every day | ORAL | 1 refills | Status: DC

## 2021-12-16 NOTE — Patient Instructions (Addendum)
Great to see you today.  I have refilled the medication(s) we provide.   If labs were collected, we will inform you of lab results once received either by echart message or telephone call.   - echart message- for normal results that have been seen by the patient already.   - telephone call: abnormal results or if patient has not viewed results in their echart.  Health Maintenance After Age 77 After age 66, you are at a higher risk for certain long-term diseases and infections as well as injuries from falls. Falls are a major cause of broken bones and head injuries in people who are older than age 36. Getting regular preventive care can help to keep you healthy and well. Preventive care includes getting regular testing and making lifestyle changes as recommended by your health care provider. Talk with your health care provider about: Which screenings and tests you should have. A screening is a test that checks for a disease when you have no symptoms. A diet and exercise plan that is right for you. What should I know about screenings and tests to prevent falls? Screening and testing are the best ways to find a health problem early. Early diagnosis and treatment give you the best chance of managing medical conditions that are common after age 48. Certain conditions and lifestyle choices may make you more likely to have a fall. Your health care provider may recommend: Regular vision checks. Poor vision and conditions such as cataracts can make you more likely to have a fall. If you wear glasses, make sure to get your prescription updated if your vision changes. Medicine review. Work with your health care provider to regularly review all of the medicines you are taking, including over-the-counter medicines. Ask your health care provider about any side effects that may make you more likely to have a fall. Tell your health care provider if any medicines that you take make you feel dizzy or sleepy. Strength  and balance checks. Your health care provider may recommend certain tests to check your strength and balance while standing, walking, or changing positions. Foot health exam. Foot pain and numbness, as well as not wearing proper footwear, can make you more likely to have a fall. Screenings, including: Osteoporosis screening. Osteoporosis is a condition that causes the bones to get weaker and break more easily. Blood pressure screening. Blood pressure changes and medicines to control blood pressure can make you feel dizzy. Depression screening. You may be more likely to have a fall if you have a fear of falling, feel depressed, or feel unable to do activities that you used to do. Alcohol use screening. Using too much alcohol can affect your balance and may make you more likely to have a fall. Follow these instructions at home: Lifestyle Do not drink alcohol if: Your health care provider tells you not to drink. If you drink alcohol: Limit how much you have to: 0-1 drink a day for women. 0-2 drinks a day for men. Know how much alcohol is in your drink. In the [place] one drink equals one 12 oz bottle of beer (355 mL), one 5 oz glass of wine (148 mL), or one 1 oz glass of hard liquor (44 mL). Do not use any products that contain nicotine or tobacco. These products include cigarettes, chewing tobacco, and vaping devices, such as e-cigarettes. If you need help quitting, ask your health care provider. Activity  Follow a regular exercise program to stay fit. This will help you  maintain your balance. Ask your health care provider what types of exercise are appropriate for you. If you need a cane or walker, use it as recommended by your health care provider. Wear supportive shoes that have nonskid soles. Safety  Remove any tripping hazards, such as rugs, cords, and clutter. Install safety equipment such as grab bars in bathrooms and safety rails on stairs. Keep rooms and walkways well-lit. General  instructions Talk with your health care provider about your risks for falling. Tell your health care provider if: You fall. Be sure to tell your health care provider about all falls, even ones that seem minor. You feel dizzy, tiredness (fatigue), or off-balance. Take over-the-counter and prescription medicines only as told by your health care provider. These include supplements. Eat a healthy diet and maintain a healthy weight. A healthy diet includes low-fat dairy products, low-fat (lean) meats, and fiber from whole grains, beans, and lots of fruits and vegetables. Stay current with your vaccines. Schedule regular health, dental, and eye exams. Summary Having a healthy lifestyle and getting preventive care can help to protect your health and wellness after age 33. Screening and testing are the best way to find a health problem early and help you avoid having a fall. Early diagnosis and treatment give you the best chance for managing medical conditions that are more common for people who are older than age 5. Falls are a major cause of broken bones and head injuries in people who are older than age 36. Take precautions to prevent a fall at home. Work with your health care provider to learn what changes you can make to improve your health and wellness and to prevent falls. This information is not intended to replace advice given to you by your health care provider. Make sure you discuss any questions you have with your health care provider. Document Revised: 04/13/2021 Document Reviewed: 04/13/2021 Elsevier Patient Education  [place].

## 2021-12-16 NOTE — Progress Notes (Signed)
This visit occurred during the SARS-CoV-2 public health emergency.  Safety protocols were in place, including screening questions prior to the visit, additional usage of staff PPE, and extensive cleaning of exam room while observing appropriate contact time as indicated for disinfecting solutions.    Patient ID: Victoria Bryant, female  DOB: Nov 19, 1946, 76 y.o.   MRN: [id number] Patient Care Team    Relationship Specialty Notifications Start End  [name], DO PCP - General Family Medicine  06/18/16   [name], MD Consulting Physician Obstetrics and Gynecology  10/06/16   [name], MD (Inactive) Consulting Physician Gastroenterology  10/08/16   Ladene Artist, MD Consulting Physician Gastroenterology  10/24/18   Lyndal Pulley, DO Consulting Physician Family Medicine  10/24/18   Allyn Kenner, MD  Dermatology  10/24/18     Chief Complaint  Patient presents with   Annual Exam    Pt is fasting    Subjective: Victoria Bryant is a 76 y.o.  Female  present for CPE All past medical history, surgical history, allergies, family history, immunizations, medications and social history were updated in the electronic medical record today. All recent labs, ED visits and hospitalizations within the last year were reviewed.  Health maintenance:  Colonoscopy:> 75> no additional screenings Mammogram:01/05/2021> GYN> has appt in Feb.  Cervical cancer screening:not indicated Immunizations: tdap UTD 2022, influ UTD 09/2021, covid > new booster due, otherwise series completed (counseled), PNA series completed. Shingrix printed.  Infectious disease screening: hep c  screen  completed  DEXA: Bone density completed 11/2018 repeat in 5 years. Assistive device: none Oxygen [id number] Patient has a Dental home. Hospitalizations/ED visits: reviewed  Depression screen [facility] 2/9 11/10/2021 12/15/2020 11/26/2020 11/19/2019 10/24/2018  Decreased Interest 0 0 0 0 0  Down, Depressed, Hopeless 0 0  0 0 0  PHQ - 2 Score 0 0 0 0 0  Altered sleeping 0 - - - 0  Tired, decreased energy 0 - - - 0  Change in appetite 0 - - - 0  Feeling bad or failure about yourself  0 - - - 0  Trouble concentrating 0 - - - 0  Moving slowly or fidgety/restless 0 - - - 0  Suicidal thoughts 0 - - - 0  PHQ-9 Score 0 - - - 0  Difficult doing work/chores - - - - Not difficult at all  Some recent data might be hidden   GAD 7 : Generalized Anxiety Score 11/10/2021 06/09/2017  Nervous, Anxious, on Edge 0 3  Control/stop worrying 0 3  Worry too much - different things 0 3  Trouble relaxing 0 3  Restless 0 1  Easily annoyed or irritable 0 3  Afraid - awful might happen 0 3  Total GAD 7 Score 0 19  Anxiety Difficulty - Somewhat difficult    Immunization History  Administered Date(s) Administered   Fluad Quad(high Dose 65+) 08/07/2019, 08/22/2020   Influenza Split 09/20/2011, 09/25/2012   Influenza Whole 08/27/2010   Influenza, High Dose Seasonal PF 10/06/2015, 12/26/2017, 09/08/2018   Influenza,inj,Quad PF,6+ Mos 09/26/2013   Influenza-Unspecified 09/17/2014, 09/05/2021   Moderna Sars-Covid-2 Vaccination 02/16/2020, 03/15/2020, 10/03/2020   Pneumococcal Conjugate-13 10/06/2015   Pneumococcal Polysaccharide-23 09/20/2011   Td 05/26/2009   Tdap 04/05/2021   Zoster, Live 09/25/2012     Past Medical History:  Diagnosis Date   Arthritis    COLONIC POLYPS, HX OF 04/21/2007   DEPRESSION 05/26/2009   Diverticulitis    Epiretinal membrane, left eye  GERD 04/08/2008   Glaucoma suspect of both eyes    [facility]   Interstitial cystitis    ? 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/2007   MENOPAUSAL SYNDROME 04/21/2007   NECK PAIN, CHRONIC 04/21/2007   Posterior vitreous detachment of both eyes    [facility]   Pseudophakia of both eyes    [facility]   Unspecified disorder of refraction    both eyes   Allergies   Allergen Reactions   Naproxen Diarrhea   Paroxetine     REACTION: unspecified   Promethazine     Hyperactive/jittery/insomnia   Clarithromycin Nausea Only   Doxycycline Hyclate Nausea And Vomiting   Erythromycin Ethylsuccinate Nausea Only   Lexapro [Escitalopram Oxalate] Other (See Comments)    Paresthesia    Past Surgical History:  Procedure Laterality Date   COLONOSCOPY  x 2   First time polyps, second time no polyps: recall 10 yrs (next is approx 2024 per pt report)   INCISION AND DRAINAGE ABSCESS ANAL     POLYPECTOMY     TONSILLECTOMY     UPPER GASTROINTESTINAL ENDOSCOPY     VAGINAL HYSTERECTOMY     Nonmalignant reason.  Ovaries still in.   Family History  Problem Relation Age of Onset   Bladder Cancer Mother    Lung cancer Mother    Breast cancer Paternal 38    ALS Daughter    Fibromyalgia Sister    Heart disease Brother    Alcohol abuse Brother    Colon cancer Neg Hx    Colon polyps Neg Hx    Esophageal cancer Neg Hx    Rectal cancer Neg Hx    Stomach cancer Neg Hx    Social History   Social History Narrative   Married, one daughter lives next door to her.   Retired Art gallery manager.   Tob: 20 pack yr hx, quit age 63.   No alc.    Allergies as of 12/16/2021       Reactions   Naproxen Diarrhea   Paroxetine    REACTION: unspecified   Promethazine    Hyperactive/jittery/insomnia   Clarithromycin Nausea Only   Doxycycline Hyclate Nausea And Vomiting   Erythromycin Ethylsuccinate Nausea Only   Lexapro [escitalopram Oxalate] Other (See Comments)   Paresthesia        Medication List        Accurate as of December 16, 2021  9:36 AM. If you have any questions, ask your nurse or doctor.          STOP taking these medications    amoxicillin-clavulanate 875-125 MG tablet Commonly known as: AUGMENTIN Stopped by: Howard Pouch, DO       TAKE these medications    ALPRAZolam 0.5 MG tablet Commonly known as: Xanax Take 1 tablet  (0.5 mg total) by mouth 2 (two) times daily as needed for anxiety.   baclofen 10 MG tablet Commonly known as: LIORESAL Take 0.5 tablets (5 mg total) by mouth 2 (two) times daily.   fluticasone 50 MCG/ACT nasal spray Commonly known as: FLONASE Place 1 spray into both nostrils daily.   gabapentin 100 MG capsule Commonly known as: NEURONTIN Take 1 capsule (100 mg total) by mouth at bedtime.   hyoscyamine 0.125 MG SL tablet Commonly known as: LEVSIN SL Place 1 tablet (0.125 mg total) under the tongue every 8 (eight) hours as needed.   JUICE PLUS FIBRE  PO Take by mouth.   meloxicam 15 MG tablet Commonly known as: MOBIC Take 1 tablet (15 mg total) by mouth daily.   pantoprazole 40 MG tablet Commonly known as: PROTONIX Take 1 tablet (40 mg total) by mouth daily.   RA PROBIOTIC GUMMIES PO Take by mouth as needed.   tolterodine 4 MG 24 hr capsule Commonly known as: DETROL LA Take 1 capsule (4 mg total) by mouth daily as needed.   VITAMIN B 12 PO Take by mouth.   vitamin C 1000 MG tablet Take 1,000 mg by mouth daily.   Vitamin D (Cholecalciferol) 25 MCG (1000 UT) Caps Take 2 capsules by mouth daily.   Zoster Vaccine Adjuvanted injection Commonly known as: SHINGRIX Inject 0.5 mLs into the muscle once for 1 dose. Rpt in 2-6 months once. Started by: Howard Pouch, DO        All past medical history, surgical history, allergies, family history, immunizations andmedications were updated in the EMR today and reviewed under the history and medication portions of their EMR.     No results found for this or any previous visit (from the past 2160 hour(s)).  No results found.   ROS 14 pt review of systems performed and negative (unless mentioned in an HPI)  Objective:  BP 123/75    Pulse 73    Temp 97.7 F (36.5 C)    Ht 5' 2.4" (1.585 m)    Wt 146 lb 9.6 oz (66.5 kg)    SpO2 98%    BMI 26.47 kg/m  Physical Exam Vitals and nursing note reviewed.  Constitutional:       General: She is not in acute distress.    Appearance: Normal appearance. She is not ill-appearing or toxic-appearing.  HENT:     Head: Normocephalic and atraumatic.     Right Ear: Tympanic membrane, ear canal and external ear normal. There is no impacted cerumen.     Left Ear: Tympanic membrane, ear canal and external ear normal. There is no impacted cerumen.     Nose: No congestion or rhinorrhea.     Mouth/Throat:     Mouth: Mucous membranes are moist.     Pharynx: Oropharynx is clear. No oropharyngeal exudate or posterior oropharyngeal erythema.  Eyes:     General: No scleral icterus.       Right eye: No discharge.        Left eye: No discharge.     Extraocular Movements: Extraocular movements intact.     Conjunctiva/sclera: Conjunctivae normal.     Pupils: Pupils are equal, round, and reactive to light.  Cardiovascular:     Rate and Rhythm: Normal rate and regular rhythm.     Pulses: Normal pulses.     Heart sounds: Normal heart sounds. No murmur heard.   No friction rub. No gallop.  Pulmonary:     Effort: Pulmonary effort is normal. No respiratory distress.     Breath sounds: Normal breath sounds. No stridor. No wheezing, rhonchi or rales.  Chest:     Chest wall: No tenderness.  Abdominal:     General: Abdomen is flat. Bowel sounds are normal. There is no distension.     Palpations: Abdomen is soft. There is no mass.     Tenderness: There is no abdominal tenderness. There is no right CVA tenderness, left CVA tenderness, guarding or rebound.     Hernia: No hernia is present.  Musculoskeletal:        General: No swelling, tenderness  or deformity. Normal range of motion.     Cervical back: Normal range of motion and neck supple. No rigidity or tenderness.     Right lower leg: No edema.     Left lower leg: No edema.  Lymphadenopathy:     Cervical: No cervical adenopathy.  Skin:    General: Skin is warm and dry.     Coloration: Skin is not jaundiced or pale.     Findings: No  bruising, erythema, lesion or rash.  Neurological:     General: No focal deficit present.     Mental Status: She is alert and oriented to person, place, and time. Mental status is at baseline.     Cranial Nerves: No cranial nerve deficit.     Sensory: No sensory deficit.     Motor: No weakness.     Coordination: Coordination normal.     Gait: Gait normal.     Deep Tendon Reflexes: Reflexes normal.  Psychiatric:        Mood and Affect: Mood normal.        Behavior: Behavior normal.        Thought Content: Thought content normal.        Judgment: Judgment normal.      No results found.  Assessment/plan: Victoria Bryant is a 76 y.o. female present for CPE  Prediabetes - CBC - Hemoglobin A1c History of colonic polyps No further screenings needed.   Long-term current use of proton pump inhibitor therapy Continue PPI - CBC - Comprehensive metabolic panel - Vitamin D (25 hydroxy) - Magnesium - B12 and Folate Panel Lipid screening/Atherosclerosis of aorta (HCC) - Lipid panel Elevated TSH - CBC - Comprehensive metabolic panel - TSH Routine general medical examination at a health care facility Colonoscopy:> 75> no additional screenings Mammogram:01/05/2021> GYN Cervical cancer screening:not indicated Immunizations: tdap UTD 2022, influ UTD 09/2021, covid > new booster due, otherwise series completed (counseled), PNA series completed. Shingrix printed.  Infectious disease screening: hep c  screen  completed  DEXA: Bone density completed 11/2018 repeat in 5 years. Patient was encouraged to exercise greater than 150 minutes a week. Patient was encouraged to choose a diet filled with fresh fruits and vegetables, and lean meats. AVS provided to patient today for education/recommendation on gender specific health and safety maintenance.  Return in about 1 year (around 12/17/2022) for CPE (30 min) and 5 mos for cmc.  Orders Placed This Encounter  Procedures   CBC    Comprehensive metabolic panel   Hemoglobin A1c   Lipid panel   TSH   Vitamin D (25 hydroxy)   Magnesium   B12 and Folate Panel   Meds ordered this encounter  Medications   Zoster Vaccine Adjuvanted [facility]) injection    Sig: Inject 0.5 mLs into the muscle once for 1 dose. Rpt in 2-6 months once.    Dispense:  1 each    Refill:  1   gabapentin (NEURONTIN) 100 MG capsule    Sig: Take 1 capsule (100 mg total) by mouth at bedtime.    Dispense:  90 capsule    Refill:  1   Referral Orders  No referral(s) requested today     Electronically signed by: Howard Pouch, [place]

## 2021-12-17 ENCOUNTER — Other Ambulatory Visit: Payer: Self-pay | Admitting: Family Medicine

## 2021-12-17 MED ORDER — PRAVASTATIN SODIUM 20 MG PO TABS: 20.0000 mg | ORAL_TABLET | Freq: Every day | ORAL | 3 refills | Status: DC

## 2021-12-23 ENCOUNTER — Ambulatory Visit (INDEPENDENT_AMBULATORY_CARE_PROVIDER_SITE_OTHER): Payer: Medicare Other

## 2021-12-23 ENCOUNTER — Other Ambulatory Visit: Payer: Self-pay

## 2021-12-23 DIAGNOSIS — Z0000 Encounter for general adult medical examination without abnormal findings: Secondary | ICD-10-CM | POA: Diagnosis not present

## 2021-12-23 NOTE — Patient Instructions (Signed)
Ms. Holzman , Thank you for taking time to come for your Medicare Wellness Visit. I appreciate your ongoing commitment to your health goals. Please review the following plan we discussed and let me know if I can assist you in the future.   Screening recommendations/referrals: Colonoscopy: Done 09/20/18 repeat every 5 years  Mammogram: Done 01/05/21 repeat every year  Bone Density: Done 11/15/18 repeat every year  Recommended yearly ophthalmology/optometry visit for glaucoma screening and checkup Recommended yearly dental visit for hygiene and checkup  Vaccinations: Influenza vaccine: Done 09/05/21 repeat every year  Pneumococcal vaccine: Up to date Tdap vaccine: Done 04/05/21 repeat every 10 years  Shingles vaccine: Shingrix discussed. Please contact your pharmacy for coverage information.    Covid-19:Completed 3/13, 4/10, & 10/03/20  Advanced directives: Please bring a copy of your health care power of attorney and living will to the office at your convenience.  Conditions/risks identified: lose weight and exercise more   Next appointment: Follow up in one year for your annual wellness visit    Preventive Care 65 Years and Older, Female Preventive care refers to lifestyle choices and visits with your health care provider that can promote health and wellness. What does preventive care include? A yearly physical exam. This is also called an annual well check. Dental exams once or twice a year. Routine eye exams. Ask your health care provider how often you should have your eyes checked. Personal lifestyle choices, including: Daily care of your teeth and gums. Regular physical activity. Eating a healthy diet. Avoiding tobacco and drug use. Limiting alcohol use. Practicing safe sex. Taking low-dose aspirin every day. Taking vitamin and mineral supplements as recommended by your health care provider. What happens during an annual well check? The services and screenings done by your  health care provider during your annual well check will depend on your age, overall health, lifestyle risk factors, and family history of disease. Counseling  Your health care provider may ask you questions about your: Alcohol use. Tobacco use. Drug use. Emotional well-being. Home and relationship well-being. Sexual activity. Eating habits. History of falls. Memory and ability to understand (cognition). Work and work Statistician. Reproductive health. Screening  You may have the following tests or measurements: Height, weight, and BMI. Blood pressure. Lipid and cholesterol levels. These may be checked every 5 years, or more frequently if you are over 63 years old. Skin check. Lung cancer screening. You may have this screening every year starting at age 63 if you have a 30-pack-year history of smoking and currently smoke or have quit within the past 15 years. Fecal occult blood test (FOBT) of the stool. You may have this test every year starting at age 65. Flexible sigmoidoscopy or colonoscopy. You may have a sigmoidoscopy every 5 years or a colonoscopy every 10 years starting at age 75. Hepatitis C blood test. Hepatitis B blood test. Sexually transmitted disease (STD) testing. Diabetes screening. This is done by checking your blood sugar (glucose) after you have not eaten for a while (fasting). You may have this done every 1-3 years. Bone density scan. This is done to screen for osteoporosis. You may have this done starting at age 82. Mammogram. This may be done every 1-2 years. Talk to your health care provider about how often you should have regular mammograms. Talk with your health care provider about your test results, treatment options, and if necessary, the need for more tests. Vaccines  Your health care provider may recommend certain vaccines, such as: Influenza vaccine.  This is recommended every year. Tetanus, diphtheria, and acellular pertussis (Tdap, Td) vaccine. You may  need a Td booster every 10 years. Zoster vaccine. You may need this after age 60. Pneumococcal 13-valent conjugate (PCV13) vaccine. One dose is recommended after age 51. Pneumococcal polysaccharide (PPSV23) vaccine. One dose is recommended after age 4. Talk to your health care provider about which screenings and vaccines you need and how often you need them. This information is not intended to replace advice given to you by your health care provider. Make sure you discuss any questions you have with your health care provider. Document Released: 12/19/2015 Document Revised: 08/11/2016 Document Reviewed: 09/23/2015 Elsevier Interactive Patient Education  2017 [place] Prevention in the Home Falls can cause injuries. They can happen to people of all ages. There are many things you can do to make your home safe and to help prevent falls. What can I do on the outside of my home? Regularly fix the edges of walkways and driveways and fix any cracks. Remove anything that might make you trip as you walk through a door, such as a raised step or threshold. Trim any bushes or trees on the path to your home. Use bright outdoor lighting. Clear any walking paths of anything that might make someone trip, such as rocks or tools. Regularly check to see if handrails are loose or broken. Make sure that both sides of any steps have handrails. Any raised decks and porches should have guardrails on the edges. Have any leaves, snow, or ice cleared regularly. Use sand or salt on walking paths during winter. Clean up any spills in your garage right away. This includes oil or grease spills. What can I do in the bathroom? Use night lights. Install grab bars by the toilet and in the tub and shower. Do not use towel bars as grab bars. Use non-skid mats or decals in the tub or shower. If you need to sit down in the shower, use a plastic, non-slip stool. Keep the floor dry. Clean up any water that spills on  the floor as soon as it happens. Remove soap buildup in the tub or shower regularly. Attach bath mats securely with double-sided non-slip rug tape. Do not have throw rugs and other things on the floor that can make you trip. What can I do in the bedroom? Use night lights. Make sure that you have a light by your bed that is easy to reach. Do not use any sheets or blankets that are too big for your bed. They should not hang down onto the floor. Have a firm chair that has side arms. You can use this for support while you get dressed. Do not have throw rugs and other things on the floor that can make you trip. What can I do in the kitchen? Clean up any spills right away. Avoid walking on wet floors. Keep items that you use a lot in easy-to-reach places. If you need to reach something above you, use a strong step stool that has a grab bar. Keep electrical cords out of the way. Do not use floor polish or wax that makes floors slippery. If you must use wax, use non-skid floor wax. Do not have throw rugs and other things on the floor that can make you trip. What can I do with my stairs? Do not leave any items on the stairs. Make sure that there are handrails on both sides of the stairs and use them. Fix handrails  that are broken or loose. Make sure that handrails are as long as the stairways. Check any carpeting to make sure that it is firmly attached to the stairs. Fix any carpet that is loose or worn. Avoid having throw rugs at the top or bottom of the stairs. If you do have throw rugs, attach them to the floor with carpet tape. Make sure that you have a light switch at the top of the stairs and the bottom of the stairs. If you do not have them, ask someone to add them for you. What else can I do to help prevent falls? Wear shoes that: Do not have high heels. Have rubber bottoms. Are comfortable and fit you well. Are closed at the toe. Do not wear sandals. If you use a stepladder: Make sure  that it is fully opened. Do not climb a closed stepladder. Make sure that both sides of the stepladder are locked into place. Ask someone to hold it for you, if possible. Clearly mark and make sure that you can see: Any grab bars or handrails. First and last steps. Where the edge of each step is. Use tools that help you move around (mobility aids) if they are needed. These include: Canes. Walkers. Scooters. Crutches. Turn on the lights when you go into a dark area. Replace any light bulbs as soon as they burn out. Set up your furniture so you have a clear path. Avoid moving your furniture around. If any of your floors are uneven, fix them. If there are any pets around you, be aware of where they are. Review your medicines with your doctor. Some medicines can make you feel dizzy. This can increase your chance of falling. Ask your doctor what other things that you can do to help prevent falls. This information is not intended to replace advice given to you by your health care provider. Make sure you discuss any questions you have with your health care provider. Document Released: 09/18/2009 Document Revised: 04/29/2016 Document Reviewed: 12/27/2014 Elsevier Interactive Patient Education  2017 [employer].

## 2021-12-23 NOTE — Progress Notes (Addendum)
Virtual Visit via Telephone Note  I connected with  Victoria Bryant on 12/23/21 at  9:30 AM EST by telephone and verified that I am speaking with the correct person using two identifiers.  Medicare Annual Wellness visit completed telephonically due to Covid-19 pandemic.   Persons participating in this call: This Health Coach and this patient.   Location: Patient: Home Provider: Office   I discussed the limitations, risks, security and privacy concerns of performing an evaluation and management service by telephone and the availability of in person appointments. The patient expressed understanding and agreed to proceed.  Unable to perform video visit due to video visit attempted and failed and/or patient does not have video capability.   Some vital signs may be absent or patient reported.   Willette Brace, LPN   Subjective:   Victoria Bryant is a 76 y.o. female who presents for Medicare Annual (Subsequent) preventive examination.  Review of Systems     Cardiac Risk Factors include: advanced age (>44men, >34 women)     Objective:    There were no vitals filed for this visit. There is no height or weight on file to calculate BMI.  Advanced Directives 12/23/2021 03/26/2021 11/26/2020 07/25/2020 11/19/2019 11/19/2019 10/27/2018  Does Patient Have a Medical Advance Directive? Yes Yes Yes Yes Yes Yes Yes  Type of Advance Directive Healthcare Power of Attorney Living will;Healthcare Power of [place];Living will - [place];Living will;Out of facility DNR (pink MOST or yellow form) - [place];Living will  Does patient want to make changes to medical advance directive? - No - Patient declined - No - Patient declined Yes (ED - Information included in AVS) - -  Copy of [place] in Chart? No - copy requested Yes - validated most recent copy scanned in chart (See row information) No - copy requested  - Yes - validated most recent copy scanned in chart (See row information) - No - copy requested  Would patient like information on creating a medical advance directive? - No - Patient declined - - - - -    Current Medications (verified) Outpatient Encounter Medications as of 12/23/2021  Medication Sig   ALPRAZolam (XANAX) 0.5 MG tablet Take 1 tablet (0.5 mg total) by mouth 2 (two) times daily as needed for anxiety.   Ascorbic Acid (VITAMIN C) 1000 MG tablet Take 1,000 mg by mouth daily.   baclofen (LIORESAL) 10 MG tablet Take 0.5 tablets (5 mg total) by mouth 2 (two) times daily. (Patient taking differently: Take 5 mg by mouth 2 (two) times daily. As needed)   Cyanocobalamin (VITAMIN B 12 PO) Take by mouth.   fluticasone (FLONASE) 50 MCG/ACT nasal spray Place 1 spray into both nostrils daily.   gabapentin (NEURONTIN) 100 MG capsule Take 1 capsule (100 mg total) by mouth at bedtime. (Patient taking differently: Take 100 mg by mouth at bedtime. As needed)   hyoscyamine (LEVSIN SL) 0.125 MG SL tablet Place 1 tablet (0.125 mg total) under the tongue every 8 (eight) hours as needed. (Patient taking differently: Place 0.125 mg under the tongue every 8 (eight) hours as needed. As needed)   meloxicam (MOBIC) 15 MG tablet Take 1 tablet (15 mg total) by mouth daily. (Patient taking differently: Take 15 mg by mouth daily. As needed)   Nutritional Supplements (JUICE PLUS FIBRE PO) Take by mouth.   pantoprazole (PROTONIX) 40 MG tablet Take 1 tablet (40 mg total) by mouth  daily.   Probiotic Product (RA PROBIOTIC GUMMIES PO) Take by mouth as needed.   tolterodine (DETROL LA) 4 MG 24 hr capsule Take 1 capsule (4 mg total) by mouth daily as needed.   Vitamin D, Cholecalciferol, 25 MCG (1000 UT) CAPS Take 2 capsules by mouth daily.   FLUAD QUADRIVALENT 0.5 ML injection    pravastatin (PRAVACHOL) 20 MG tablet Take 1 tablet (20 mg total) by mouth daily. (Patient not taking: Reported on 12/23/2021)   No  facility-administered encounter medications on file as of 12/23/2021.    Allergies (verified) Naproxen, Paroxetine, Promethazine, Clarithromycin, Doxycycline hyclate, Erythromycin ethylsuccinate, and Lexapro [escitalopram oxalate]   History: Past Medical History:  Diagnosis Date   Arthritis    COLONIC POLYPS, HX OF 04/21/2007   COVID-19 11/2021   mild case   DEPRESSION 05/26/2009   Diverticulitis    Epiretinal membrane, left eye    GERD 04/08/2008   Glaucoma suspect of both eyes    [facility]   Interstitial cystitis    ? 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  Ovaries still in.   Family History  Problem Relation Age of Onset   Bladder Cancer Mother    Lung cancer Mother    Breast cancer Paternal 33    ALS Daughter    Fibromyalgia Sister    Heart disease Brother    Alcohol abuse Brother    Colon cancer Neg Hx    Colon polyps Neg Hx    Esophageal cancer Neg Hx    Rectal cancer Neg Hx    Stomach cancer Neg Hx    Social History   Socioeconomic History   Marital status: Widowed    Spouse name: Not on file   Number of children: Not on file   Years of education: Not on file   Highest education level: Not on file  Occupational History   Occupation: Retired  Tobacco Use   Smoking status: Former    Types: Cigarettes    Quit date:  12/06/1973    Years since quitting: 48.0   Smokeless tobacco: Never  Vaping Use   Vaping Use: Never used  Substance and Sexual Activity   Alcohol use: No   Drug use: No   Sexual activity: Not on file  Other Topics Concern   Not on file  Social History Narrative   Married, one daughter lives next door to her.   Retired Art gallery manager.   Tob: 20 pack yr hx, quit age 66.   No alc.   Social Determinants of Health   Financial Resource Strain: Low Risk    Difficulty of Paying Living Expenses: Not hard at all  Food Insecurity: No Food Insecurity   Worried About Charity fundraiser in  the Last Year: Never true   Ran Out of Food in the Last Year: Never true  Transportation Needs: No Transportation Needs   Lack of Transportation (Medical): No   Lack of Transportation (Non-Medical): No  Physical Activity: Inactive   Days of Exercise per Week: 0 days   Minutes of Exercise per Session: 0 min  Stress: No Stress Concern Present   Feeling of Stress : Not at all  Social Connections: Moderately Isolated   Frequency of Communication with Friends and Family: More than three times a week   Frequency of Social Gatherings with Friends and Family: More than three times a week   Attends Religious Services: More than 4 times per year   Active Member of [employer] or Organizations: No   Attends Archivist Meetings: Never   Marital Status: Widowed    Tobacco Counseling Counseling given: Not Answered   Clinical Intake:  Pre-visit preparation completed: Yes  Pain : No/denies pain     BMI - recorded: 26.47 Nutritional Status: BMI 25 -29 Overweight Nutritional Risks: None Diabetes: No  How often do you need to have someone help you when you read instructions, pamphlets, or other written materials from your doctor or pharmacy?: 1 - Never  Diabetic?No  Interpreter Needed?: No  Information entered by :: Charlott Rakes, LPN   Activities of Daily Living In your present  state of health, do you have any difficulty performing the following activities: 12/23/2021  Hearing? N  Vision? N  Difficulty concentrating or making decisions? N  Walking or climbing stairs? N  Dressing or bathing? N  Doing errands, shopping? N  Preparing Food and eating ? N  Using the Toilet? N  In the past six months, have you accidently leaked urine? N  Do you have problems with loss of bowel control? N  Managing your Medications? N  Managing your Finances? N  Housekeeping or managing your Housekeeping? N  Some recent data might be hidden    Patient Care Team: [name], DO as PCP - General (Family Medicine) [name], MD as Consulting Physician (Obstetrics and Gynecology) [name], MD (Inactive) as Consulting Physician (Gastroenterology) Ladene Artist, MD as Consulting Physician (Gastroenterology) Lyndal Pulley, DO as Consulting Physician (Family Medicine) Allyn Kenner, MD (Dermatology)  Indicate any recent Medical Services you may have received from other than [facility] providers in the past year (date may be approximate).     Assessment:   This is a routine wellness examination for Keyna.  Hearing/Vision screen Hearing Screening - Comments:: Pt denies any hearing issues Vision Screening - Comments:: Pt follows upw ith my eye Dr in [place] for annul eye exams   Dietary issues and exercise activities discussed: Current Exercise Habits: The patient does not participate in regular exercise at present   Goals Addressed             This Visit's Progress    Patient Stated       Lose weight and exercise more        Depression Screen PHQ 2/9 Scores 12/23/2021 11/10/2021 12/15/2020 11/26/2020 11/19/2019 10/24/2018 03/15/2018  PHQ - 2 Score 0 0 0 0 0 0 0  PHQ- 9 Score - 0 - - - 0 -    Fall Risk Fall Risk  12/23/2021 11/10/2021 11/26/2020 11/19/2019 10/24/2018  Falls in the past year? 0 0 0 0 0  Number falls in past yr: 0 0 0 0 -  Injury with  Fall? 0 0 0 - -  Risk for fall due to : - No Fall Risks - - -  Follow up Falls prevention discussed Falls evaluation completed Falls prevention discussed Education provided;Falls evaluation completed;Falls prevention discussed -    FALL RISK PREVENTION PERTAINING TO THE HOME:  Any stairs in or around the home? Yes  If so, are there any without handrails? No  Home free of loose throw rugs in walkways, pet beds, electrical cords, etc? Yes  Adequate lighting in your home to reduce risk of falls? Yes   ASSISTIVE DEVICES UTILIZED TO PREVENT FALLS:  Life alert? No  Use of a cane, walker or w/c? No  Grab bars in the bathroom? Yes  Shower chair or bench in shower? Yes  Elevated toilet seat or a handicapped toilet? No   TIMED UP AND GO:  Was the test performed? No .   Cognitive Function: MMSE - Mini Mental State Exam 10/24/2018 10/21/2017  Orientation to time 5 5  Orientation to Place 5 5  Registration 3 3  Attention/ Calculation 5 5  Recall 2 3  Language- name 2 objects 2 2  Language- repeat 1 1  Language- follow 3 step command 3 3  Language- read & follow direction 1 1  Write a sentence 1 1  Copy design 1 1  Total score 29 30     6CIT Screen 12/23/2021  What Year? 0 points  What month? 0 points  What time? 0 points  Count back from 20 0 points  Months in reverse 0 points  Repeat phrase 0 points  Total Score 0    Immunizations Immunization History  Administered Date(s) Administered   Fluad Quad(high Dose 65+) 08/07/2019, 08/22/2020   Influenza Split 09/20/2011, 09/25/2012   Influenza Whole 08/27/2010   Influenza, High Dose Seasonal PF 10/06/2015, 12/26/2017, 09/08/2018   Influenza,inj,Quad PF,6+ Mos 09/26/2013   Influenza-Unspecified 09/17/2014, 09/05/2021   Moderna Sars-Covid-2 Vaccination 02/16/2020, 03/15/2020, 10/03/2020   Pneumococcal Conjugate-13 10/06/2015   Pneumococcal Polysaccharide-23 09/20/2011   Td 05/26/2009   Tdap 04/05/2021   Zoster, Live  09/25/2012    TDAP status: Up to date  Flu Vaccine status: Up to date  Pneumococcal vaccine status: Up to date  Covid-19 vaccine status: Completed vaccines  Qualifies for Shingles Vaccine? Yes   Zostavax completed Yes   Shingrix Completed?: No.    Education has been provided regarding the importance of this vaccine. Patient has been advised to call insurance company to determine out of pocket expense if they have not yet received this vaccine. Advised may also receive vaccine at local pharmacy or Health Dept. Verbalized acceptance and understanding.  Screening Tests Health Maintenance  Topic Date Due   Zoster Vaccines- Shingrix (1 of 2) 12/06/2022 (Originally 05/24/1965)   COVID-19 Vaccine (4 - Booster for Moderna series) 01/01/2023 (Originally 11/28/2020)   MAMMOGRAM  01/05/2022   DEXA SCAN  11/16/2023   TETANUS/TDAP  04/06/2031   Pneumonia Vaccine 28+ Years old  Completed   INFLUENZA VACCINE  Completed   Hepatitis C Screening  Completed   HPV VACCINES  Aged Out    Health Maintenance  There are no preventive care reminders to display for this patient.  Colorectal cancer screening: No longer required.   Mammogram status: Completed 01/05/21. Repeat every year  Bone Density status: Completed 11/15/18. Results reflect: Bone density results: NORMAL. Repeat every 5 years.   Additional Screening:  Hepatitis C Screening:  Completed 10/06/16  Vision Screening: Recommended annual ophthalmology exams for early detection of glaucoma and other disorders of  the eye. Is the patient up to date with their annual eye exam?  Yes  Who is the provider or what is the name of the office in which the patient attends annual eye exams? [facility]  If pt is not established with a provider, would they like to be referred to a provider to establish care? No .   Dental Screening: Recommended annual dental exams for proper oral hygiene  Community Resource Referral / Chronic Care Management: CRR  required this visit?  No   CCM required this visit?  No      Plan:     I have personally reviewed and noted the following in the patients chart:   Medical and social history Use of alcohol, tobacco or illicit drugs  Current medications and supplements including opioid prescriptions.  Functional ability and status Nutritional status Physical activity Advanced directives List of other physicians Hospitalizations, surgeries, and ER visits in previous 12 months Vitals Screenings to include cognitive, depression, and falls Referrals and appointments  In addition, I have reviewed and discussed with patient certain preventive protocols, quality metrics, and best practice recommendations. A written personalized care plan for preventive services as well as general preventive health recommendations were provided to patient.     Willette Brace, LPN   [id number]/[id number]   Nurse Notes: None

## 2022-01-13 DIAGNOSIS — Z1231 Encounter for screening mammogram for malignant neoplasm of breast: Secondary | ICD-10-CM | POA: Diagnosis not present

## 2022-01-13 LAB — HM MAMMOGRAPHY

## 2022-01-26 DIAGNOSIS — L658 Other specified nonscarring hair loss: Secondary | ICD-10-CM | POA: Diagnosis not present

## 2022-01-26 DIAGNOSIS — Z85820 Personal history of malignant melanoma of skin: Secondary | ICD-10-CM | POA: Diagnosis not present

## 2022-01-26 DIAGNOSIS — Z08 Encounter for follow-up examination after completed treatment for malignant neoplasm: Secondary | ICD-10-CM | POA: Diagnosis not present

## 2022-01-26 DIAGNOSIS — L570 Actinic keratosis: Secondary | ICD-10-CM | POA: Diagnosis not present

## 2022-01-26 DIAGNOSIS — Z1283 Encounter for screening for malignant neoplasm of skin: Secondary | ICD-10-CM | POA: Diagnosis not present

## 2022-01-26 DIAGNOSIS — X32XXXD Exposure to sunlight, subsequent encounter: Secondary | ICD-10-CM | POA: Diagnosis not present

## 2022-01-26 DIAGNOSIS — D225 Melanocytic nevi of trunk: Secondary | ICD-10-CM | POA: Diagnosis not present

## 2022-03-09 DIAGNOSIS — R300 Dysuria: Secondary | ICD-10-CM | POA: Diagnosis not present

## 2022-04-14 ENCOUNTER — Encounter (HOSPITAL_BASED_OUTPATIENT_CLINIC_OR_DEPARTMENT_OTHER): Payer: Self-pay

## 2022-04-14 ENCOUNTER — Emergency Department (HOSPITAL_BASED_OUTPATIENT_CLINIC_OR_DEPARTMENT_OTHER)
Admission: EM | Admit: 2022-04-14 | Discharge: 2022-04-14 | Disposition: A | Discharge status: Home / Self Care | Payer: Medicare Other | Attending: Emergency Medicine | Admitting: Emergency Medicine

## 2022-04-14 ENCOUNTER — Emergency Department (HOSPITAL_BASED_OUTPATIENT_CLINIC_OR_DEPARTMENT_OTHER): Payer: Medicare Other

## 2022-04-14 ENCOUNTER — Other Ambulatory Visit: Payer: Self-pay

## 2022-04-14 DIAGNOSIS — R195 Other fecal abnormalities: Secondary | ICD-10-CM | POA: Diagnosis not present

## 2022-04-14 DIAGNOSIS — R194 Change in bowel habit: Secondary | ICD-10-CM | POA: Diagnosis not present

## 2022-04-14 DIAGNOSIS — R198 Other specified symptoms and signs involving the digestive system and abdomen: Secondary | ICD-10-CM

## 2022-04-14 DIAGNOSIS — R109 Unspecified abdominal pain: Secondary | ICD-10-CM | POA: Diagnosis not present

## 2022-04-14 LAB — COMPREHENSIVE METABOLIC PANEL
ALT: 24 U/L (ref 0–44)
AST: 26 U/L (ref 15–41)
Albumin: 4.1 g/dL (ref 3.5–5.0)
Alkaline Phosphatase: 74 U/L (ref 38–126)
Anion gap: 6 (ref 5–15)
BUN: 11 mg/dL (ref 8–23)
CO2: 29 mmol/L (ref 22–32)
Calcium: 9.5 mg/dL (ref 8.9–10.3)
Chloride: 107 mmol/L (ref 98–111)
Creatinine, Ser: 0.82 mg/dL (ref 0.44–1.00)
GFR, Estimated: >60 mL/min (ref >60)
Glucose, Bld: 100 mg/dL — ABNORMAL HIGH (ref 70–99)
Potassium: 4.2 mmol/L (ref 3.5–5.1)
Sodium: 142 mmol/L (ref 135–145)
Total Bilirubin: 0.6 mg/dL (ref 0.3–1.2)
Total Protein: 7.5 g/dL (ref 6.5–8.1)

## 2022-04-14 LAB — URINALYSIS, ROUTINE W REFLEX MICROSCOPIC
Bilirubin Urine: NEGATIVE
Glucose, UA: NEGATIVE mg/dL
Ketones, ur: NEGATIVE mg/dL
Nitrite: NEGATIVE
Protein, ur: NEGATIVE mg/dL
Specific Gravity, Urine: 1.01 (ref 1.005–1.030)
pH: 6.5 (ref 5.0–8.0)

## 2022-04-14 LAB — CBC WITH DIFFERENTIAL/PLATELET
Abs Immature Granulocytes: 0.03 10*3/uL (ref 0.00–0.07)
Basophils Absolute: 0.1 10*3/uL (ref 0.0–0.1)
Basophils Relative: 1 %
Eosinophils Absolute: 0.4 10*3/uL (ref 0.0–0.5)
Eosinophils Relative: 5 %
HCT: 41.1 % (ref 36.0–46.0)
Hemoglobin: 13.6 g/dL (ref 12.0–15.0)
Immature Granulocytes: 0 %
Lymphocytes Relative: 40 %
Lymphs Abs: 3.4 10*3/uL (ref 0.7–4.0)
MCH: 30.4 pg (ref 26.0–34.0)
MCHC: 33.1 g/dL (ref 30.0–36.0)
MCV: 91.9 fL (ref 80.0–100.0)
Monocytes Absolute: 0.7 10*3/uL (ref 0.1–1.0)
Monocytes Relative: 9 %
Neutro Abs: 3.8 10*3/uL (ref 1.7–7.7)
Neutrophils Relative %: 45 %
Platelets: 222 10*3/uL (ref 150–400)
RBC: 4.47 MIL/uL (ref 3.87–5.11)
RDW: 13.1 % (ref 11.5–15.5)
WBC: 8.5 10*3/uL (ref 4.0–10.5)
nRBC: 0 % (ref 0.0–0.2)

## 2022-04-14 LAB — URINALYSIS, MICROSCOPIC (REFLEX)

## 2022-04-14 LAB — LIPASE, BLOOD: Lipase: 42 U/L (ref 11–51)

## 2022-04-14 NOTE — ED Provider Notes (Signed)
?[name] EM[facility]ENC[facility] DEPARTMENT ?Provider Note ? ? ?CSN: [id number] ?Arrival date & time: 04/14/22  5188 ? ?  ? ?History ? ?Chief Complaint  ?Patient presents with  ? Abdominal Pain  ?  Rectal pain  ? ? ?Victoria Bryant is a 76 y.o. female. ? ?HPI ? ?  ? ?76 y/o F with PMH of recurrent diverticulitis, GERD, colonic polyps, and interstitial cystitis presents with change in stool x 1 week. Pt states she is going once daily versus 2-3 daily as usual. Pt states stool is softer and lighter in color than normal, but not diarrhea. Pt states there is no blood in stool. Notes associated foul smell. Pt does not recent course of Augmentin two weeks ago. Notes associated cramping, tenesmus. Denies fever, fatigue, chills, sweats, unintentional weight loss/gain. Denies SHOB, cough, palpitations, chest pain. Denies change in urinary habits. Denies numbness, tingling, weakness, HA, lightheadedness. Pt states this feels different then her typical onset of symptoms for diverticulitis, GERD, and interstitial cystitis. Denies sick contacts. Denies recent travel. Denies known consumption of contaminated food/drink.  ? ?Home Medications ?Prior to Admission medications   ?Medication Sig Start Date End Date Taking? Authorizing Provider  ?ALPRAZolam (XANAX) 0.5 MG tablet Take 1 tablet (0.5 mg total) by mouth 2 (two) times daily as needed for anxiety. 11/10/21   Howard Pouch A, DO  ?Ascorbic Acid (VITAMIN C) 1000 MG tablet Take 1,000 mg by mouth daily.    [provider]  ?baclofen (LIORESAL) 10 MG tablet Take 0.5 tablets (5 mg total) by mouth 2 (two) times daily. ?Patient taking differently: Take 5 mg by mouth 2 (two) times daily. As needed 11/10/21   Howard Pouch A, DO  ?Cyanocobalamin (VITAMIN B 12 PO) Take by mouth.    [provider]  ?FLUAD QUADRIVALENT 0.5 ML injection  09/11/21   [provider]  ?fluticasone (FLONASE) 50 MCG/ACT nasal spray Place 1 spray into both nostrils daily. 11/10/21    Kuneff, Renee A, DO  ?gabapentin (NEURONTIN) 100 MG capsule Take 1 capsule (100 mg total) by mouth at bedtime. ?Patient taking differently: Take 100 mg by mouth at bedtime. As needed 12/16/21   Kuneff, Renee A, DO  ?hyoscyamine (LEVSIN SL) 0.125 MG SL tablet Place 1 tablet (0.125 mg total) under the tongue every 8 (eight) hours as needed. ?Patient taking differently: Place 0.125 mg under the tongue every 8 (eight) hours as needed. As needed 06/02/21   Kuneff, Renee A, DO  ?meloxicam (MOBIC) 15 MG tablet Take 1 tablet (15 mg total) by mouth daily. ?Patient taking differently: Take 15 mg by mouth daily. As needed 11/10/21   Howard Pouch A, DO  ?Nutritional Supplements (JUICE PLUS FIBRE PO) Take by mouth.    [provider]  ?pantoprazole (PROTONIX) 40 MG tablet Take 1 tablet (40 mg total) by mouth daily. 11/10/21   Kuneff, Renee A, DO  ?pravastatin (PRAVACHOL) 20 MG tablet Take 1 tablet (20 mg total) by mouth daily. ?Patient not taking: Reported on 12/23/2021 12/17/21   Howard Pouch A, DO  ?Probiotic Product (RA PROBIOTIC GUMMIES PO) Take by mouth as needed.    [provider]  ?tolterodine (DETROL LA) 4 MG 24 hr capsule Take 1 capsule (4 mg total) by mouth daily as needed. 11/10/21   Howard Pouch A, DO  ?Vitamin D, Cholecalciferol, 25 MCG (1000 UT) CAPS Take 2 capsules by mouth daily.    [provider]  ?   ? ?Allergies    ?Naproxen, Paroxetine, Promethazine,  Clarithromycin, Doxycycline hyclate, Erythromycin ethylsuccinate, and Lexapro [escitalopram oxalate]   ? ?Review of Systems   ?Review of Systems  ?All other systems reviewed and are negative. ? ?Physical Exam ?Updated Vital Signs ?BP (!) 153/65 (BP Location: Left Arm)   Pulse 62   Temp 98 ?F (36.7 ?C) (Oral)   Resp 18   Ht '5\' 2"'$  (1.575 m)   Wt 65.3 kg   SpO2 98%   BMI 26.34 kg/m?  ?Physical Exam ?Vitals and nursing note reviewed.  ?Constitutional:   ?   Appearance: She is well-developed.  ?HENT:  ?   Head: Atraumatic.   ?Cardiovascular:  ?   Rate and Rhythm: Normal rate.  ?Pulmonary:  ?   Effort: Pulmonary effort is normal.  ?Abdominal:  ?   Tenderness: There is no abdominal tenderness.  ?Musculoskeletal:  ?   Cervical back: Normal range of motion and neck supple.  ?Skin: ?   General: Skin is warm and dry.  ?Neurological:  ?   Mental Status: She is alert and oriented to person, place, and time.  ? ? ?ED Results / Procedures / Treatments   ?Labs ?(all labs ordered are listed, but only abnormal results are displayed) ?Labs Reviewed  ?URINALYSIS, ROUTINE W REFLEX MICROSCOPIC - Abnormal; Notable for the following components:  ?    Result Value  ? Hgb urine dipstick TRACE (*)   ? Leukocytes,Ua SMALL (*)   ? All other components within normal limits  ?COMPREHENSIVE METABOLIC PANEL - Abnormal; Notable for the following components:  ? Glucose, Bld 100 (*)   ? All other components within normal limits  ?URINALYSIS, MICROSCOPIC (REFLEX) - Abnormal; Notable for the following components:  ? Bacteria, UA RARE (*)   ? Non Squamous Epithelial PRESENT (*)   ? All other components within normal limits  ?GASTROINTESTINAL PANEL BY PCR, STOOL (REPLACES STOOL CULTURE)  ?CBC WITH DIFFERENTIAL/PLATELET  ?LIPASE, BLOOD  ? ? ?EKG ?None ? ?Radiology ?DG Abdomen Acute W/Chest ? ?Result Date: 04/14/2022 ?CLINICAL DATA:  Abdominal cramping EXAM: DG ABDOMEN ACUTE WITH 1 VIEW CHEST COMPARISON:  Chest two-view 01/08/2019 FINDINGS: Cardiac and mediastinal contours normal. Lungs clear without infiltrate or effusion Normal bowel gas pattern. No bowel obstruction and no free air. Mild stool in the colon. No urinary tract calculi Lumbar scoliosis and disc degeneration. IMPRESSION: Negative abdominal radiographs.  No acute cardiopulmonary disease. Electronically Signed   By: Franchot Gallo M.D.   On: 04/14/2022 09:35   ? ?Procedures ?Procedures  ? ? ?Medications Ordered in ED ?Medications - No data to display ? ?ED Course/ Medical Decision Making/ A&P ?  ?                         ?This patient presents to the ED with chief complaint(s) of abnormal stool with pertinent past medical history of interstitial cystitis, diverticulitis which further complicates the presenting complaint.  ? ?Strictly speaking, patient has no diarrhea.  She is also not having any abdominal tenderness.  There is change in bowel regimen.  No urinary symptoms associated with her BM and she is not passing any fecal material with her urine. ? ?The differential diagnosis considered includes diverticulitis, ileus, constipation, early SBO, fistula. ? ?Clinically, it does not appear that she is having diarrheal illness, but patient is concerned that her stool looks abnormal and is foul-smelling.  She was on Augmentin recently, but she is not really having diarrhea or nausea-vomiting or abdominal pain, doubt that this is  C. difficile colitis.  Rule out C. difficile studies only if her white count is profoundly elevated. ? ?The initial plan is to order basic blood work and reassess the patient.  I do not think CT scan is indicated.  Acute abdominal series ordered to ensure there is no severe constipation or multiple air-fluid levels ? ?Additional history obtained: ?Records reviewed  previous visits and CT scans ? ?Independent labs interpretation:  ?The following labs were independently interpreted: Normal UA, normal CBC and metabolic profile ? ?Independent visualization of imaging: ?- I independently visualized the following imaging with scope of interpretation limited to determining acute life threatening conditions related to emergency care: Acute abdominal series, which revealed no evidence of air-fluid level or severe constipation ? ?Treatment and Reassessment: ?Patient was able to give [place] a stool sample.  We will send a GI pathogen study on that stool.  Discussed return precautions with the patient in case her symptoms get worse.  At this time CT is not merited, but if her symptoms evolve then she might need  it ? ? ?Consideration for admission or further workup: Considered CT, as indicated above, thought to be low utility in the current setting. ? ? ? ?Final Clinical Impression(s) / ED Diagnoses ?Final diagnoses:  ?Abnormal bowel m

## 2022-04-14 NOTE — ED Notes (Signed)
ED Provider at bedside. 

## 2022-04-14 NOTE — Discharge Instructions (Signed)
We saw in the ER for change in your bowel regimen. ? ?Sample of your stool has been sent for infection work-up.  He did not have any concerning abdominal pain, lab results are reassuring and the vital signs are also reassuring -so we doubt conditions like obstruction, internal abscesses. ? ?If you start having increased pain, bloody stools, fevers, severe nausea and vomiting please return to the ER. ? ?Consider following up with your PCP if your symptoms continue more than 2 weeks. ?

## 2022-04-14 NOTE — ED Triage Notes (Signed)
Pt reports sensation of abdominal cramping, urgency to move bowels but unable to go.  Last bowel movement yesterday, states bad odor and pain per rectum.  Denies blood , soft brown stool but different from usual.   ?

## 2022-04-15 LAB — GASTROINTESTINAL PANEL BY PCR, STOOL (REPLACES STOOL CULTURE)

## 2022-05-25 ENCOUNTER — Ambulatory Visit (INDEPENDENT_AMBULATORY_CARE_PROVIDER_SITE_OTHER): Payer: Medicare Other | Admitting: Family Medicine

## 2022-05-25 ENCOUNTER — Telehealth: Payer: Self-pay | Admitting: Family Medicine

## 2022-05-25 ENCOUNTER — Encounter: Payer: Self-pay | Admitting: Family Medicine

## 2022-05-25 VITALS — BP 121/71 | HR 77 | Temp 98.2°F | Ht 62.0 in | Wt 147.0 lb

## 2022-05-25 DIAGNOSIS — R7303 Prediabetes: Secondary | ICD-10-CM

## 2022-05-25 DIAGNOSIS — K580 Irritable bowel syndrome with diarrhea: Secondary | ICD-10-CM | POA: Diagnosis not present

## 2022-05-25 DIAGNOSIS — K219 Gastro-esophageal reflux disease without esophagitis: Secondary | ICD-10-CM

## 2022-05-25 DIAGNOSIS — M5386 Other specified dorsopathies, lumbar region: Secondary | ICD-10-CM

## 2022-05-25 DIAGNOSIS — I700 Atherosclerosis of aorta: Secondary | ICD-10-CM

## 2022-05-25 DIAGNOSIS — N3946 Mixed incontinence: Secondary | ICD-10-CM

## 2022-05-25 DIAGNOSIS — G479 Sleep disorder, unspecified: Secondary | ICD-10-CM

## 2022-05-25 DIAGNOSIS — F418 Other specified anxiety disorders: Secondary | ICD-10-CM | POA: Diagnosis not present

## 2022-05-25 DIAGNOSIS — E785 Hyperlipidemia, unspecified: Secondary | ICD-10-CM

## 2022-05-25 DIAGNOSIS — R32 Unspecified urinary incontinence: Secondary | ICD-10-CM | POA: Insufficient documentation

## 2022-05-25 DIAGNOSIS — N3010 Interstitial cystitis (chronic) without hematuria: Secondary | ICD-10-CM | POA: Diagnosis not present

## 2022-05-25 LAB — LIPID PANEL
Cholesterol: 232 mg/dL — ABNORMAL HIGH (ref 0–200)
HDL: 75.2 mg/dL (ref >39.00)
LDL Cholesterol: 130 mg/dL — ABNORMAL HIGH (ref 0–99)
NonHDL: 156.65
Total CHOL/HDL Ratio: 3
Triglycerides: 132 mg/dL (ref 0.0–149.0)
VLDL: 26.4 mg/dL (ref 0.0–40.0)

## 2022-05-25 LAB — POCT GLYCOSYLATED HEMOGLOBIN (HGB A1C)
HbA1c POC (<> result, manual entry): 5.4 % (ref 4.0–5.6)
HbA1c, POC (controlled diabetic range): 5.4 % (ref 0.0–7.0)
HbA1c, POC (prediabetic range): 5.4 % — AB (ref 5.7–6.4)
Hemoglobin A1C: 5.4 % (ref 4.0–5.6)

## 2022-05-25 MED ORDER — HYOSCYAMINE SULFATE 0.125 MG SL SUBL: 0.1250 mg | SUBLINGUAL_TABLET | Freq: Three times a day (TID) | SUBLINGUAL | 1 refills | Status: DC | PRN

## 2022-05-25 MED ORDER — BACLOFEN 10 MG PO TABS: 5.0000 mg | ORAL_TABLET | Freq: Two times a day (BID) | ORAL | 1 refills | Status: DC

## 2022-05-25 MED ORDER — MELOXICAM 15 MG PO TABS: 15.0000 mg | ORAL_TABLET | Freq: Every day | ORAL | 1 refills | Status: DC

## 2022-05-25 MED ORDER — PRAVASTATIN SODIUM 20 MG PO TABS: 20.0000 mg | ORAL_TABLET | Freq: Every day | ORAL | 3 refills | Status: DC

## 2022-05-25 MED ORDER — GABAPENTIN 100 MG PO CAPS: 100.0000 mg | ORAL_CAPSULE | Freq: Every day | ORAL | 1 refills | Status: DC

## 2022-05-25 MED ORDER — ALPRAZOLAM 0.5 MG PO TABS: 0.5000 mg | ORAL_TABLET | Freq: Two times a day (BID) | ORAL | 1 refills | Status: DC | PRN

## 2022-05-25 MED ORDER — PANTOPRAZOLE SODIUM 40 MG PO TBEC: 40.0000 mg | DELAYED_RELEASE_TABLET | Freq: Every day | ORAL | 3 refills | Status: DC

## 2022-05-25 MED ORDER — TOLTERODINE TARTRATE ER 4 MG PO CP24: 4.0000 mg | ORAL_CAPSULE | Freq: Every day | ORAL | 1 refills | Status: DC | PRN

## 2022-05-25 NOTE — Progress Notes (Signed)
Patient ID: Victoria Bryant, female  DOB: [birth year], 76 y.o.   MRN: [id number] Patient Care Team    Relationship Specialty Notifications Start End  [name], DO PCP - General Family Medicine  06/18/16   [name], MD Consulting Physician Obstetrics and Gynecology  10/06/16   [name], MD (Inactive) Consulting Physician Gastroenterology  10/08/16   Ladene Artist, MD Consulting Physician Gastroenterology  10/24/18   Lyndal Pulley, DO Consulting Physician Family Medicine  10/24/18   Allyn Kenner, MD  Dermatology  10/24/18     Chief Complaint  Patient presents with   Depression    [facility]; pt is fasting    Subjective: Victoria Bryant is a 76 y.o.  Female  present for [facility] All past medical history, surgical history, allergies, family history, immunizations, medications and social history were updated in the electronic medical record today. All recent labs, ED visits and hospitalizations within the last year were reviewed.  anxiety/sleep disturbance: Patient reports she is feeling very well.  She uses Xanax as needed when having increased anxiety or difficulty falling asleep.  She is no longer requiring the use of Lexapro to help with her anxiety/depression history.     Atherosclerosis of aorta [facility]) Patient reports she has been watching her diet.  She has recently joined the gym.  She is uncertain if she is taking the Pravachol.  She knows she did not want to take Crestor.   GERD: Edition is stable on protonix daily. Unable to wean off without recurrent symptoms. She is supplementing b12 and vit d.  Labs are up-to-date   Pain emptying bladder/urinary incontinence:  She reports detrol La is very helpful with her urinary incontinence.  She is having to use daily- she is having leakage.    Prediabetes She reports she has been back in the gym and exercising and feels great.  Last A1c was 5.9.   Scoliosis/left sciatica lumbar region degenerative disease:  Reports  conditions is overall doing well.  She rarely takes the Mobic or by the baclofen, but does on occasions.         05/25/2022    8:35 AM 12/23/2021    9:35 AM 11/10/2021    8:56 AM 12/15/2020   10:26 AM 11/26/2020    8:19 AM  Depression screen PHQ 2/9  Decreased Interest 0 0 0 0 0  Down, Depressed, Hopeless 0 0 0 0 0  PHQ - 2 Score 0 0 0 0 0  Altered sleeping 0  0    Tired, decreased energy 0  0    Change in appetite 0  0    Feeling bad or failure about yourself  0  0    Trouble concentrating 0  0    Moving slowly or fidgety/restless 0  0    Suicidal thoughts 0  0    PHQ-9 Score 0  0        05/25/2022    8:35 AM 11/10/2021    8:56 AM 06/09/2017    8:44 AM  GAD 7 : Generalized Anxiety Score  Nervous, Anxious, on Edge 0 0 3  Control/stop worrying 0 0 3  Worry too much - different things 0 0 3  Trouble relaxing 0 0 3  Restless 0 0 1  Easily annoyed or irritable 0 0 3  Afraid - awful might happen 0 0 3  Total GAD 7 Score 0 0 19  Anxiety Difficulty   Somewhat difficult  Immunization History  Administered Date(s) Administered   Fluad Quad(high Dose 65+) 08/07/2019, 08/22/2020   Influenza Split 09/20/2011, 09/25/2012   Influenza Whole 08/27/2010   Influenza, High Dose Seasonal PF 10/06/2015, 12/26/2017, 09/08/2018   Influenza,inj,Quad PF,6+ Mos 09/26/2013   Influenza-Unspecified 09/17/2014, 09/05/2021   Moderna Sars-Covid-2 Vaccination 02/16/2020, 03/15/2020, 10/03/2020   Pneumococcal Conjugate-13 10/06/2015   Pneumococcal Polysaccharide-23 09/20/2011   Td 05/26/2009   Tdap 04/05/2021   Zoster Recombinat (Shingrix) 02/08/2022   Zoster, Live 09/25/2012     Past Medical History:  Diagnosis Date   Arthritis    COLONIC POLYPS, HX OF 04/21/2007   Combined forms of age-related cataract of left eye 07/19/2019   COVID-19 11/2021   mild case   DEPRESSION 05/26/2009   Diverticulitis    Epiretinal membrane, left eye    GERD 04/08/2008   Glaucoma suspect of both eyes     [facility]   Interstitial cystitis    ? 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/2007   MENOPAUSAL SYNDROME 04/21/2007   NECK PAIN, CHRONIC 04/21/2007   Posterior vitreous detachment of both eyes    [facility]   Pseudophakia of both eyes    [facility]   Regular astigmatism, left eye 07/19/2019   Unspecified disorder of refraction    both eyes   Allergies  Allergen Reactions   Naproxen Diarrhea   Paroxetine     REACTION: unspecified   Promethazine     Hyperactive/jittery/insomnia   Clarithromycin Nausea Only   Doxycycline Hyclate Nausea And Vomiting   Erythromycin Ethylsuccinate Nausea Only   Lexapro [Escitalopram Oxalate] Other (See Comments)    Paresthesia    Past Surgical History:  Procedure Laterality Date   COLONOSCOPY  x 2   First time polyps, second time no polyps: recall 10 yrs (next is approx 2024 per pt report)   INCISION AND DRAINAGE ABSCESS ANAL     POLYPECTOMY     TONSILLECTOMY     UPPER GASTROINTESTINAL ENDOSCOPY     VAGINAL HYSTERECTOMY     Nonmalignant reason.  Ovaries still in.   Family History  Problem Relation Age of Onset   Bladder Cancer Mother    Lung cancer Mother    Breast cancer Paternal 82    ALS Daughter    Fibromyalgia Sister    Heart disease Brother    Alcohol abuse Brother    Colon cancer Neg Hx    Colon polyps Neg Hx    Esophageal cancer Neg Hx    Rectal cancer Neg Hx    Stomach cancer Neg Hx    Social History   Social History Narrative   Married, one daughter lives next door to her.   Retired Art gallery manager.   Tob: 20 pack yr hx, quit age 55.   No alc.    Allergies as of 05/25/2022       Reactions   Naproxen Diarrhea   Paroxetine    REACTION: unspecified   Promethazine    Hyperactive/jittery/insomnia   Clarithromycin Nausea Only   Doxycycline Hyclate Nausea And Vomiting   Erythromycin Ethylsuccinate Nausea Only    Lexapro [escitalopram Oxalate] Other (See Comments)   Paresthesia        Medication List        Accurate as of May 25, 2022 11:19 AM. If you have any questions, ask your nurse or doctor.          ALPRAZolam 0.5  MG tablet Commonly known as: Xanax Take 1 tablet (0.5 mg total) by mouth 2 (two) times daily as needed for anxiety.   baclofen 10 MG tablet Commonly known as: LIORESAL Take 0.5 tablets (5 mg total) by mouth 2 (two) times daily. What changed: additional instructions   Fluad Quadrivalent 0.5 ML injection Generic drug: influenza vaccine adjuvanted   fluticasone 50 MCG/ACT nasal spray Commonly known as: FLONASE Place 1 spray into both nostrils daily.   gabapentin 100 MG capsule Commonly known as: NEURONTIN Take 1 capsule (100 mg total) by mouth at bedtime. As needed   hyoscyamine 0.125 MG SL tablet Commonly known as: LEVSIN SL Place 1 tablet (0.125 mg total) under the tongue every 8 (eight) hours as needed. What changed: additional instructions   JUICE PLUS FIBRE PO Take by mouth.   meloxicam 15 MG tablet Commonly known as: MOBIC Take 1 tablet (15 mg total) by mouth daily. As needed   pantoprazole 40 MG tablet Commonly known as: PROTONIX Take 1 tablet (40 mg total) by mouth daily.   pravastatin 20 MG tablet Commonly known as: PRAVACHOL Take 1 tablet (20 mg total) by mouth daily.   RA PROBIOTIC GUMMIES PO Take by mouth as needed.   tolterodine 4 MG 24 hr capsule Commonly known as: DETROL LA Take 1 capsule (4 mg total) by mouth daily as needed.   VITAMIN B 12 PO Take by mouth.   vitamin C 1000 MG tablet Take 1,000 mg by mouth daily.   Vitamin D (Cholecalciferol) 25 MCG (1000 UT) Caps Take 2 capsules by mouth daily.        All past medical history, surgical history, allergies, family history, immunizations andmedications were updated in the EMR today and reviewed under the history and medication portions of their EMR.       No results  found.   ROS 14 pt review of systems performed and negative (unless mentioned in an HPI)  Objective: BP 121/71   Pulse 77   Temp 98.2 F (36.8 C) (Oral)   Ht '5\' 2"'$  (1.575 m)   Wt 147 lb (66.7 kg)   SpO2 96%   BMI 26.89 kg/m  Physical Exam Vitals and nursing note reviewed.  Constitutional:      General: She is not in acute distress.    Appearance: Normal appearance. She is not ill-appearing or toxic-appearing.  HENT:     Head: Normocephalic and atraumatic.  Eyes:     General: No scleral icterus.       Right eye: No discharge.        Left eye: No discharge.     Extraocular Movements: Extraocular movements intact.     Conjunctiva/sclera: Conjunctivae normal.     Pupils: Pupils are equal, round, and reactive to light.  Cardiovascular:     Rate and Rhythm: Normal rate and regular rhythm.     Pulses: Normal pulses.     Heart sounds: Normal heart sounds. No murmur heard.    No friction rub. No gallop.  Pulmonary:     Effort: Pulmonary effort is normal. No respiratory distress.     Breath sounds: Normal breath sounds. No stridor. No wheezing, rhonchi or rales.  Chest:     Chest wall: No tenderness.  Abdominal:     Tenderness: There is no right CVA tenderness or left CVA tenderness.  Musculoskeletal:     Right lower leg: No edema.     Left lower leg: No edema.  Skin:    Findings:  No rash.  Neurological:     General: No focal deficit present.     Mental Status: She is alert and oriented to person, place, and time. Mental status is at baseline.     Cranial Nerves: No cranial nerve deficit.     Sensory: No sensory deficit.     Motor: No weakness.     Coordination: Coordination normal.     Gait: Gait normal.     Deep Tendon Reflexes: Reflexes normal.  Psychiatric:        Mood and Affect: Mood normal.        Behavior: Behavior normal.        Thought Content: Thought content normal.        Judgment: Judgment normal.     No results found.  Assessment/plan: Victoria Bryant is a 76 y.o. female present for  Prediabetes - Hemoglobin A1c>5.9> 5.4 today.  - keep up the good work. She joined a gym. Elevated ldl/Atherosclerosis of aorta (HCC) - Lipid panel collected today -She does not think she is taking the Pravachol.  She is uncertain why.  She knew she did not want take the Crestor.  She will check her bottles when she gets home.  We discussed the benefits of taking a statin medication with her history of elevated cholesterol and atherosclerosis. GERD/long term PPI therapy/Long-term current use of proton pump inhibitor therapy: stable Continue Protonix 40 mg daily. Mag, vit d and b12 UTD (follow yearly)   Pain emptying bladder/urinary incontinence:  Stable  continue Detrol LA.    Scoliosis, left sciatica lumbar region degenerative disease:  stable Continue baclofen 1/2 tab BID PRN continue Mobic daily.   Sleep disturbance/anxiety Stable Uses Xanax nightly when having difficulty sleeping secondary to anxiety. Continue  xanax PRN.  [place] controlled substance database reviewed today and appropriate   Leg cramps Greatly improved Continue gabapentin prn (rarely uses)     Return in about 24 weeks (around 11/09/2022) for Routine chronic condition follow-up.  Orders Placed This Encounter  Procedures   Lipid panel   POCT HgB A1C   Meds ordered this encounter  Medications   baclofen (LIORESAL) 10 MG tablet    Sig: Take 0.5 tablets (5 mg total) by mouth 2 (two) times daily.    Dispense:  45 each    Refill:  1   gabapentin (NEURONTIN) 100 MG capsule    Sig: Take 1 capsule (100 mg total) by mouth at bedtime. As needed    Dispense:  90 capsule    Refill:  1   hyoscyamine (LEVSIN SL) 0.125 MG SL tablet    Sig: Place 1 tablet (0.125 mg total) under the tongue every 8 (eight) hours as needed.    Dispense:  90 tablet    Refill:  1   tolterodine (DETROL LA) 4 MG 24 hr capsule    Sig: Take 1 capsule (4 mg total) by mouth daily as needed.     Dispense:  90 capsule    Refill:  1   meloxicam (MOBIC) 15 MG tablet    Sig: Take 1 tablet (15 mg total) by mouth daily. As needed    Dispense:  90 tablet    Refill:  1   pravastatin (PRAVACHOL) 20 MG tablet    Sig: Take 1 tablet (20 mg total) by mouth daily.    Dispense:  90 tablet    Refill:  3   pantoprazole (PROTONIX) 40 MG tablet    Sig: Take 1 tablet (  40 mg total) by mouth daily.    Dispense:  90 tablet    Refill:  3   ALPRAZolam (XANAX) 0.5 MG tablet    Sig: Take 1 tablet (0.5 mg total) by mouth 2 (two) times daily as needed for anxiety.    Dispense:  180 tablet    Refill:  1   Referral Orders  No referral(s) requested today     Electronically signed by: Howard Pouch, [place]

## 2022-05-25 NOTE — Patient Instructions (Addendum)
Return in about 24 weeks (around 11/09/2022) for Routine chronic condition follow-up.        Great to see you today.  I have refilled the medication(s) we provide.   If labs were collected, we will inform you of lab results once received either by echart message or telephone call.   - echart message- for normal results that have been seen by the patient already.   - telephone call: abnormal results or if patient has not viewed results in their echart.

## 2022-05-25 NOTE — Telephone Encounter (Signed)
These inform patient her cholesterol is better in comparison to when last checked.  I suspect this is most likely from her dietary changes and exercise. Her LDL/bad cholesterol was 156, now 130. I would encourage her to still start the pravastatin, if she has not already, to help give her the added cardiovascular protection with her atherosclerotic cardiovascular disease.

## 2022-05-26 NOTE — Telephone Encounter (Signed)
Spoke with patient regarding results/recommendations.  

## 2022-05-26 NOTE — Telephone Encounter (Signed)
LM for pt to return call regarding results/recommendations.

## 2022-06-17 DIAGNOSIS — T1510XA Foreign body in conjunctival sac, unspecified eye, initial encounter: Secondary | ICD-10-CM | POA: Diagnosis not present

## 2022-07-21 DIAGNOSIS — M25461 Effusion, right knee: Secondary | ICD-10-CM | POA: Diagnosis not present

## 2022-07-21 DIAGNOSIS — M2021 Hallux rigidus, right foot: Secondary | ICD-10-CM | POA: Diagnosis not present

## 2022-07-21 DIAGNOSIS — M25561 Pain in right knee: Secondary | ICD-10-CM | POA: Diagnosis not present

## 2022-10-11 DIAGNOSIS — N390 Urinary tract infection, site not specified: Secondary | ICD-10-CM | POA: Diagnosis not present

## 2022-11-01 DIAGNOSIS — H04129 Dry eye syndrome of unspecified lacrimal gland: Secondary | ICD-10-CM | POA: Diagnosis not present

## 2022-11-10 ENCOUNTER — Ambulatory Visit (INDEPENDENT_AMBULATORY_CARE_PROVIDER_SITE_OTHER): Payer: Medicare Other | Admitting: Family Medicine

## 2022-11-10 ENCOUNTER — Encounter: Payer: Self-pay | Admitting: Family Medicine

## 2022-11-10 VITALS — BP 130/74 | HR 74 | Temp 98.2°F | Ht 62.0 in | Wt 146.0 lb

## 2022-11-10 DIAGNOSIS — Z79899 Other long term (current) drug therapy: Secondary | ICD-10-CM | POA: Diagnosis not present

## 2022-11-10 DIAGNOSIS — G479 Sleep disorder, unspecified: Secondary | ICD-10-CM

## 2022-11-10 DIAGNOSIS — F419 Anxiety disorder, unspecified: Secondary | ICD-10-CM

## 2022-11-10 DIAGNOSIS — N3946 Mixed incontinence: Secondary | ICD-10-CM | POA: Diagnosis not present

## 2022-11-10 DIAGNOSIS — K219 Gastro-esophageal reflux disease without esophagitis: Secondary | ICD-10-CM

## 2022-11-10 DIAGNOSIS — I700 Atherosclerosis of aorta: Secondary | ICD-10-CM

## 2022-11-10 DIAGNOSIS — M5386 Other specified dorsopathies, lumbar region: Secondary | ICD-10-CM

## 2022-11-10 DIAGNOSIS — N3010 Interstitial cystitis (chronic) without hematuria: Secondary | ICD-10-CM | POA: Diagnosis not present

## 2022-11-10 DIAGNOSIS — K580 Irritable bowel syndrome with diarrhea: Secondary | ICD-10-CM | POA: Diagnosis not present

## 2022-11-10 LAB — LIPID PANEL
Cholesterol: 171 mg/dL (ref 0–200)
HDL: 72.2 mg/dL (ref >39.00)
LDL Cholesterol: 81 mg/dL (ref 0–99)
NonHDL: 98.84
Total CHOL/HDL Ratio: 2
Triglycerides: 87 mg/dL (ref 0.0–149.0)
VLDL: 17.4 mg/dL (ref 0.0–40.0)

## 2022-11-10 MED ORDER — GABAPENTIN 100 MG PO CAPS: 100.0000 mg | ORAL_CAPSULE | Freq: Every day | ORAL | 1 refills | Status: DC

## 2022-11-10 MED ORDER — BACLOFEN 10 MG PO TABS: 5.0000 mg | ORAL_TABLET | Freq: Two times a day (BID) | ORAL | 1 refills | Status: DC

## 2022-11-10 MED ORDER — ALPRAZOLAM 0.5 MG PO TABS: 0.5000 mg | ORAL_TABLET | Freq: Two times a day (BID) | ORAL | 1 refills | Status: DC | PRN

## 2022-11-10 MED ORDER — HYOSCYAMINE SULFATE 0.125 MG SL SUBL: 0.1250 mg | SUBLINGUAL_TABLET | Freq: Three times a day (TID) | SUBLINGUAL | 1 refills | Status: DC | PRN

## 2022-11-10 MED ORDER — FLUTICASONE PROPIONATE 50 MCG/ACT NA SUSP: 1.0000 | Freq: Every day | NASAL | 3 refills | Status: DC

## 2022-11-10 MED ORDER — PANTOPRAZOLE SODIUM 40 MG PO TBEC: 40.0000 mg | DELAYED_RELEASE_TABLET | Freq: Every day | ORAL | 3 refills | Status: DC

## 2022-11-10 MED ORDER — TOLTERODINE TARTRATE ER 4 MG PO CP24: 4.0000 mg | ORAL_CAPSULE | Freq: Every day | ORAL | 1 refills | Status: DC | PRN

## 2022-11-10 MED ORDER — MELOXICAM 15 MG PO TABS: 15.0000 mg | ORAL_TABLET | Freq: Every day | ORAL | 1 refills | Status: DC

## 2022-11-10 NOTE — Patient Instructions (Addendum)
Return in about 24 weeks (around 04/27/2023) for cpe (20 min), Routine chronic condition follow-up.        Great to see you today.  I have refilled the medication(s) we provide.   If labs were collected, we will inform you of lab results once received either by echart message or telephone call.   - echart message- for normal results that have been seen by the patient already.   - telephone call: abnormal results or if patient has not viewed results in their echart.

## 2022-11-10 NOTE — Progress Notes (Signed)
Patient ID: Victoria Bryant, female  DOB: 08-15-1946, 76 y.o.   MRN: [id number] Patient Care Team    Relationship Specialty Notifications Start End  [name], DO PCP - General Family Medicine  06/18/16   [name], MD Consulting Physician Obstetrics and Gynecology  10/06/16   [name], MD (Inactive) Consulting Physician Gastroenterology  10/08/16   Ladene Artist, MD Consulting Physician Gastroenterology  10/24/18   Lyndal Pulley, DO Consulting Physician Family Medicine  10/24/18   Allyn Kenner, MD  Dermatology  10/24/18     Chief Complaint  Patient presents with   Anxiety    [facility]; pt is fasting    Subjective: Victoria Bryant is a 76 y.o.  Female  present for [facility] All past medical history, surgical history, allergies, family history, immunizations, medications and social history were updated in the electronic medical record today. All recent labs, ED visits and hospitalizations within the last year were reviewed.  anxiety/sleep disturbance: Patient reports she is feeling great.  She uses Xanax as needed when having increased anxiety or difficulty falling asleep.   She had been prescribed Lexapro in the past for her depression/anxiety, but has no longer required.   Atherosclerosis of aorta [facility]) Patient reports she has been watching her diet and going to the gym.  She is now compliant with pravastatin. She did not desire to take Crestor.   GERD: Condition is stable on protonix daily. Unable to wean off without recurrent symptoms. She is supplementing b12 and vit d.    Pain emptying bladder/urinary incontinence:  She reports detrol La is working well for her urinary incontinence.     Scoliosis/left sciatica lumbar region degenerative disease:  Reports her pain is well-controlled with Mobic and baclofen when needed.       11/10/2022    8:03 AM 05/25/2022    8:35 AM 12/23/2021    9:35 AM 11/10/2021    8:56 AM 12/15/2020   10:26 AM  Depression screen PHQ  2/9  Decreased Interest 0 0 0 0 0  Down, Depressed, Hopeless 0 0 0 0 0  PHQ - 2 Score 0 0 0 0 0  Altered sleeping 0 0  0   Tired, decreased energy 0 0  0   Change in appetite 0 0  0   Feeling bad or failure about yourself  0 0  0   Trouble concentrating 0 0  0   Moving slowly or fidgety/restless 0 0  0   Suicidal thoughts 0 0  0   PHQ-9 Score 0 0  0       11/10/2022    8:07 AM 05/25/2022    8:35 AM 11/10/2021    8:56 AM 06/09/2017    8:44 AM  GAD 7 : Generalized Anxiety Score  Nervous, Anxious, on Edge 0 0 0 3  Control/stop worrying 1 0 0 3  Worry too much - different things 0 0 0 3  Trouble relaxing 0 0 0 3  Restless 0 0 0 1  Easily annoyed or irritable 0 0 0 3  Afraid - awful might happen 0 0 0 3  Total GAD 7 Score 1 0 0 19  Anxiety Difficulty    Somewhat difficult    Immunization History  Administered Date(s) Administered   Fluad Quad(high Dose 65+) 08/07/2019, 08/22/2020   Influenza Split 09/20/2011, 09/25/2012   Influenza Whole 08/27/2010   Influenza, High Dose Seasonal PF 10/06/2015, 12/26/2017, 09/08/2018   Influenza,inj,Quad PF,6+  Mos 09/26/2013   Influenza-Unspecified 09/17/2014, 09/05/2021, 09/05/2022   Moderna Sars-Covid-2 Vaccination 02/16/2020, 03/15/2020, 10/03/2020   Pneumococcal Conjugate-13 10/06/2015   Pneumococcal Polysaccharide-23 09/20/2011   Td 05/26/2009   Tdap 04/05/2021   Zoster Recombinat (Shingrix) 02/08/2022   Zoster, Live 09/25/2012     Past Medical History:  Diagnosis Date   Arthritis    COLONIC POLYPS, HX OF 04/21/2007   Combined forms of age-related cataract of left eye 07/19/2019   COVID-19 11/2021   mild case   DEPRESSION 05/26/2009   Diverticulitis    Epiretinal membrane, left eye    GERD 04/08/2008   Glaucoma suspect of both eyes    [facility]   Interstitial cystitis    ? 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/2007   MENOPAUSAL SYNDROME 04/21/2007   NECK PAIN,  CHRONIC 04/21/2007   Posterior vitreous detachment of both eyes    [facility]   Pseudophakia of both eyes    [facility]   Regular astigmatism, left eye 07/19/2019   Unspecified disorder of refraction    both eyes   Allergies  Allergen Reactions   Naproxen Diarrhea   Paroxetine     REACTION: unspecified   Promethazine     Hyperactive/jittery/insomnia   Clarithromycin Nausea Only   Doxycycline Hyclate Nausea And Vomiting   Erythromycin Ethylsuccinate Nausea Only   Lexapro [Escitalopram Oxalate] Other (See Comments)    Paresthesia    Past Surgical History:  Procedure Laterality Date   COLONOSCOPY  x 2   First time polyps, second time no polyps: recall 10 yrs (next is approx 2024 per pt report)   INCISION AND DRAINAGE ABSCESS ANAL     POLYPECTOMY     TONSILLECTOMY     UPPER GASTROINTESTINAL ENDOSCOPY     VAGINAL HYSTERECTOMY     Nonmalignant reason.  Ovaries still in.   Family History  Problem Relation Age of Onset   Bladder Cancer Mother    Lung cancer Mother    Breast cancer Paternal 2    ALS Daughter    Fibromyalgia Sister    Heart disease Brother    Alcohol abuse Brother    Colon cancer Neg Hx    Colon polyps Neg Hx    Esophageal cancer Neg Hx    Rectal cancer Neg Hx    Stomach cancer Neg Hx    Social History   Social History Narrative   Married, one daughter lives next door to her.   Retired Art gallery manager.   Tob: 20 pack yr hx, quit age 82.   No alc.    Allergies as of 11/10/2022       Reactions   Naproxen Diarrhea   Paroxetine    REACTION: unspecified   Promethazine    Hyperactive/jittery/insomnia   Clarithromycin Nausea Only   Doxycycline Hyclate Nausea And Vomiting   Erythromycin Ethylsuccinate Nausea Only   Lexapro [escitalopram Oxalate] Other (See Comments)   Paresthesia        Medication List        Accurate as of November 10, 2022  8:25 AM. If you have any questions, ask your nurse or  doctor.          STOP taking these medications    Fluad Quadrivalent 0.5 ML injection Generic drug: influenza vaccine adjuvanted Stopped by: Howard Pouch, DO       TAKE these medications    ALPRAZolam 0.5 MG tablet Commonly  known as: Xanax Take 1 tablet (0.5 mg total) by mouth 2 (two) times daily as needed for anxiety.   baclofen 10 MG tablet Commonly known as: LIORESAL Take 0.5 tablets (5 mg total) by mouth 2 (two) times daily.   fluticasone 50 MCG/ACT nasal spray Commonly known as: FLONASE Place 1 spray into both nostrils daily.   gabapentin 100 MG capsule Commonly known as: NEURONTIN Take 1 capsule (100 mg total) by mouth at bedtime. As needed   hyoscyamine 0.125 MG SL tablet Commonly known as: LEVSIN SL Place 1 tablet (0.125 mg total) under the tongue every 8 (eight) hours as needed.   JUICE PLUS FIBRE PO Take by mouth.   meloxicam 15 MG tablet Commonly known as: MOBIC Take 1 tablet (15 mg total) by mouth daily. As needed   pantoprazole 40 MG tablet Commonly known as: PROTONIX Take 1 tablet (40 mg total) by mouth daily.   pravastatin 20 MG tablet Commonly known as: PRAVACHOL Take 1 tablet (20 mg total) by mouth daily.   RA PROBIOTIC GUMMIES PO Take by mouth as needed.   tolterodine 4 MG 24 hr capsule Commonly known as: DETROL LA Take 1 capsule (4 mg total) by mouth daily as needed.   VITAMIN B 12 PO Take by mouth.   vitamin C 1000 MG tablet Take 1,000 mg by mouth daily.   Vitamin D (Cholecalciferol) 25 MCG (1000 UT) Caps Take 2 capsules by mouth daily.        All past medical history, surgical history, allergies, family history, immunizations andmedications were updated in the EMR today and reviewed under the history and medication portions of their EMR.       No results found.   ROS 14 pt review of systems performed and negative (unless mentioned in an HPI)  Objective: BP 130/74   Pulse 74   Temp 98.2 F (36.8 C) (Oral)   Ht  '5\' 2"'$  (1.575 m)   Wt 146 lb (66.2 kg)   SpO2 99%   BMI 26.70 kg/m  Physical Exam Vitals and nursing note reviewed.  Constitutional:      General: She is not in acute distress.    Appearance: Normal appearance. She is not ill-appearing or toxic-appearing.  HENT:     Head: Normocephalic and atraumatic.  Eyes:     General: No scleral icterus.       Right eye: No discharge.        Left eye: No discharge.     Extraocular Movements: Extraocular movements intact.     Conjunctiva/sclera: Conjunctivae normal.     Pupils: Pupils are equal, round, and reactive to light.  Cardiovascular:     Rate and Rhythm: Normal rate and regular rhythm.     Pulses: Normal pulses.     Heart sounds: Normal heart sounds. No murmur heard.    No friction rub. No gallop.  Pulmonary:     Effort: Pulmonary effort is normal. No respiratory distress.     Breath sounds: Normal breath sounds. No stridor. No wheezing, rhonchi or rales.  Chest:     Chest wall: No tenderness.  Abdominal:     Tenderness: There is no right CVA tenderness or left CVA tenderness.  Musculoskeletal:     Right lower leg: No edema.     Left lower leg: No edema.  Skin:    Findings: No rash.  Neurological:     General: No focal deficit present.     Mental Status: She is alert and  oriented to person, place, and time. Mental status is at baseline.     Cranial Nerves: No cranial nerve deficit.     Sensory: No sensory deficit.     Motor: No weakness.     Coordination: Coordination normal.     Gait: Gait normal.     Deep Tendon Reflexes: Reflexes normal.  Psychiatric:        Mood and Affect: Mood normal.        Behavior: Behavior normal.        Thought Content: Thought content normal.        Judgment: Judgment normal.     No results found.  Assessment/plan: Victoria Bryant is a 76 y.o. female present for  Elevated ldl/Atherosclerosis of aorta (HCC) -LFT and lipids collected today. Patient is now compliant with pravastatin  nightly.  GERD/long term PPI therapy/Long-term current use of proton pump inhibitor therapy: Stable Continue Protonix 40 mg daily. Mag, vit d and b12 UTD (follow yearly)   Pain emptying bladder/urinary incontinence:  Stable  continue Detrol LA.    Scoliosis, left sciatica lumbar region degenerative disease:  Stable Continue baclofen 1/2 tab BID PRN Continue Mobic daily.   Sleep disturbance/anxiety Stable Continue Xanax nightly as needed, when having difficulty sleeping secondary to anxiety. [place] controlled substance database reviewed today and appropriate   Leg cramps Stable Continue gabapentin prn (rarely uses)     Return in about 24 weeks (around 04/27/2023) for cpe (20 min), Routine chronic condition follow-up.  Orders Placed This Encounter  Procedures   Lipid panel   Meds ordered this encounter  Medications   ALPRAZolam (XANAX) 0.5 MG tablet    Sig: Take 1 tablet (0.5 mg total) by mouth 2 (two) times daily as needed for anxiety.    Dispense:  180 tablet    Refill:  1   baclofen (LIORESAL) 10 MG tablet    Sig: Take 0.5 tablets (5 mg total) by mouth 2 (two) times daily.    Dispense:  45 each    Refill:  1   fluticasone (FLONASE) 50 MCG/ACT nasal spray    Sig: Place 1 spray into both nostrils daily.    Dispense:  48 g    Refill:  3   gabapentin (NEURONTIN) 100 MG capsule    Sig: Take 1 capsule (100 mg total) by mouth at bedtime. As needed    Dispense:  90 capsule    Refill:  1   hyoscyamine (LEVSIN SL) 0.125 MG SL tablet    Sig: Place 1 tablet (0.125 mg total) under the tongue every 8 (eight) hours as needed.    Dispense:  90 tablet    Refill:  1   meloxicam (MOBIC) 15 MG tablet    Sig: Take 1 tablet (15 mg total) by mouth daily. As needed    Dispense:  90 tablet    Refill:  1   pantoprazole (PROTONIX) 40 MG tablet    Sig: Take 1 tablet (40 mg total) by mouth daily.    Dispense:  90 tablet    Refill:  3   tolterodine (DETROL LA) 4 MG 24 hr  capsule    Sig: Take 1 capsule (4 mg total) by mouth daily as needed.    Dispense:  90 capsule    Refill:  1   Referral Orders  No referral(s) requested today     Electronically signed by: Howard Pouch, [place]

## 2022-11-11 ENCOUNTER — Telehealth: Payer: Self-pay | Admitting: Family Medicine

## 2022-11-11 NOTE — Telephone Encounter (Signed)
Spoke with pt regarding labs and instructions.   

## 2022-11-11 NOTE — Telephone Encounter (Signed)
Pt has some additional questions and concerns for about her lab results. Please give the patient a call back.

## 2022-11-26 ENCOUNTER — Ambulatory Visit: Payer: Medicare Other | Admitting: Family Medicine

## 2022-12-07 DIAGNOSIS — J019 Acute sinusitis, unspecified: Secondary | ICD-10-CM | POA: Diagnosis not present

## 2022-12-07 DIAGNOSIS — J069 Acute upper respiratory infection, unspecified: Secondary | ICD-10-CM | POA: Diagnosis not present

## 2022-12-29 ENCOUNTER — Ambulatory Visit (INDEPENDENT_AMBULATORY_CARE_PROVIDER_SITE_OTHER): Payer: Medicare Other

## 2022-12-29 ENCOUNTER — Ambulatory Visit: Payer: Medicare Other

## 2022-12-29 DIAGNOSIS — Z0000 Encounter for general adult medical examination without abnormal findings: Secondary | ICD-10-CM | POA: Diagnosis not present

## 2022-12-29 NOTE — Progress Notes (Signed)
Subjective:   Victoria Bryant is a 77 y.o. female who presents for Medicare Annual (Subsequent) preventive examination.  I connected with  [name] on 12/29/22 by an audio only telemedicine application and verified that I am speaking with the correct person using two identifiers.   I discussed the limitations, risks, security and privacy concerns of performing an evaluation and management service by telephone and the availability of in person appointments. I also discussed with the patient that there may be a patient responsible charge related to this service. The patient expressed understanding and verbally consented to this telephonic visit.  Location of Patient: home Location of Provider:office  List any persons and their role that are participating in the visit with the patient.   [place], CMA  Review of Systems    Defer to PCP Cardiac Risk Factors include: advanced age (>28mn, >>36women)     Objective:    There were no vitals filed for this visit. There is no height or weight on file to calculate BMI.     12/29/2022   10:57 AM 04/14/2022    8:35 AM 12/23/2021    9:36 AM 03/26/2021    8:19 AM 11/26/2020    8:18 AM 07/25/2020    8:19 AM 11/19/2019    8:07 AM  Advanced Directives  Does Patient Have a Medical Advance Directive? Yes No Yes Yes Yes Yes Yes  Type of [place]of [place] will  Healthcare Power of Attorney Living will;Healthcare Power of [place]Living will  [place] Reg[place] will;Out of facility DNR (pink MOST or yellow form)  Does patient want to make changes to medical advance directive? No - Patient declined   No - Patient declined  No - Patient declined Yes (ED - Information included in AVS)  Copy of H[place] Chart? No - copy requested  No - copy requested Yes - validated most recent copy scanned in chart (See row  information) No - copy requested  Yes - validated most recent copy scanned in chart (See row information)  Would patient like information on creating a medical advance directive?  No - Patient declined  No - Patient declined       Current Medications (verified) Outpatient Encounter Medications as of 12/29/2022  Medication Sig   ALPRAZolam (XANAX) 0.5 MG tablet Take 1 tablet (0.5 mg total) by mouth 2 (two) times daily as needed for anxiety.   Ascorbic Acid (VITAMIN C) 1000 MG tablet Take 1,000 mg by mouth daily.   baclofen (LIORESAL) 10 MG tablet Take 0.5 tablets (5 mg total) by mouth 2 (two) times daily.   Cyanocobalamin (VITAMIN B 12 PO) Take by mouth.   fluticasone (FLONASE) 50 MCG/ACT nasal spray Place 1 spray into both nostrils daily.   gabapentin (NEURONTIN) 100 MG capsule Take 1 capsule (100 mg total) by mouth at bedtime. As needed   hyoscyamine (LEVSIN SL) 0.125 MG SL tablet Place 1 tablet (0.125 mg total) under the tongue every 8 (eight) hours as needed.   meloxicam (MOBIC) 15 MG tablet Take 1 tablet (15 mg total) by mouth daily. As needed   Nutritional Supplements (JUICE PLUS FIBRE PO) Take by mouth.   pantoprazole (PROTONIX) 40 MG tablet Take 1 tablet (40 mg total) by mouth daily.   pravastatin (PRAVACHOL) 20 MG tablet Take 1 tablet (20 mg total) by mouth daily.   Probiotic Product (RA PROBIOTIC GUMMIES PO) Take by  mouth as needed.   tolterodine (DETROL LA) 4 MG 24 hr capsule Take 1 capsule (4 mg total) by mouth daily as needed.   Vitamin D, Cholecalciferol, 25 MCG (1000 UT) CAPS Take 2 capsules by mouth daily.   No facility-administered encounter medications on file as of 12/29/2022.    Allergies (verified) Naproxen, Paroxetine, Promethazine, Clarithromycin, Doxycycline hyclate, Erythromycin ethylsuccinate, and Lexapro [escitalopram oxalate]   History: Past Medical History:  Diagnosis Date   Arthritis    COLONIC POLYPS, HX OF 04/21/2007   Combined forms of age-related  cataract of left eye 07/19/2019   COVID-19 11/2021   mild case   DEPRESSION 05/26/2009   Diverticulitis    Epiretinal membrane, left eye    GERD 04/08/2008   Glaucoma suspect of both eyes    [facility]   Interstitial cystitis    ? 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   Regular astigmatism, left eye 07/19/2019   Unspecified disorder of refraction    both eyes   Past Surgical History:  Procedure Laterality Date   COLONOSCOPY  x 2   First time polyps, second time no polyps: recall 10 yrs (next is approx 2024 per pt report)   INCISION AND DRAINAGE ABSCESS ANAL     POLYPECTOMY     TONSILLECTOMY     UPPER GASTROINTESTINAL ENDOSCOPY     VAGINAL HYSTERECTOMY     Nonmalignant reason.  Ovaries still in.   Family History  Problem Relation Age of Onset   Bladder Cancer Mother    Lung cancer Mother    Breast cancer Paternal 6    ALS Daughter    Fibromyalgia Sister    Heart disease Brother    Alcohol abuse Brother    Colon cancer Neg Hx    Colon polyps Neg Hx    Esophageal cancer Neg Hx    Rectal cancer Neg Hx    Stomach cancer Neg Hx    Social History   Socioeconomic History   Marital status: Widowed    Spouse name: Not on file   Number of children: Not on file   Years of education: Not on file   Highest education level: GED or equivalent  Occupational History   Occupation: Retired  Tobacco Use   Smoking status: Former    Types: Cigarettes    Quit date: 12/06/1973    Years since quitting: 49.0   Smokeless tobacco: Never  Vaping Use   Vaping Use: Never used  Substance and Sexual Activity   Alcohol use: No   Drug use: No   Sexual activity: Not on file  Other Topics Concern   Not on file  Social History  Narrative   Married, one daughter lives next door to her.   Retired Art gallery manager.   Tob: 20 pack yr hx, quit age 39.   No alc.   Social Determinants of Health   Financial Resource Strain: Low Risk  (12/29/2022)   Overall Financial Resource Strain (CARDIA)    Difficulty of Paying Living Expenses: Not hard at all  Food Insecurity: No Food Insecurity (12/29/2022)   Hunger Vital Sign    Worried About Running Out of Food in the Last Year: Never true  Ran Out of Food in the Last Year: Never true  Transportation Needs: No Transportation Needs (12/29/2022)   PRAPARE - Hydrologist (Medical): No    Lack of Transportation (Non-Medical): No  Physical Activity: Insufficiently Active (12/29/2022)   Exercise Vital Sign    Days of Exercise per Week: 3 days    Minutes of Exercise per Session: 30 min  Stress: No Stress Concern Present (12/29/2022)   [place]    Feeling of Stress : Not at all  Social Connections: Moderately Integrated (12/29/2022)   Social Connection and Isolation Panel [NHANES]    Frequency of Communication with Friends and Family: More than three times a week    Frequency of Social Gatherings with Friends and Family: Twice a week    Attends Religious Services: 1 to 4 times per year    Active Member of [employer] or Organizations: Yes    Attends Archivist Meetings: 1 to 4 times per year    Marital Status: Widowed  Recent Concern: Social Connections - Moderately Isolated (11/08/2022)   Social Connection and Isolation Panel [NHANES]    Frequency of Communication with Friends and Family: More than three times a week    Frequency of Social Gatherings with Friends and Family: More than three times a week    Attends Religious Services: 1 to 4 times per year    Active Member of [employer] or Organizations: No    Attends Archivist Meetings: Not on file    Marital Status:  Widowed    Tobacco Counseling Counseling given: Not Answered   Clinical Intake:  Pre-visit preparation completed: No  Pain : No/denies pain     Nutritional Risks: None Diabetes: No  How often do you need to have someone help you when you read instructions, pamphlets, or other written materials from your doctor or pharmacy?: 1 - Never  Diabetic?no  Interpreter Needed?: No      Activities of Daily Living    12/29/2022   10:58 AM 12/12/2022    1:32 PM  In your present state of health, do you have any difficulty performing the following activities:  Hearing? 0 0  Vision? 0 0  Difficulty concentrating or making decisions? 0 0  Walking or climbing stairs? 0 0  Dressing or bathing? 0 0  Doing errands, shopping? 0 0  Preparing Food and eating ? N N  Using the Toilet? N N  In the past six months, have you accidently leaked urine? N Y  Do you have problems with loss of bowel control? N N  Managing your Medications? N N  Managing your Finances? N N  Housekeeping or managing your Housekeeping? N N    Patient Care Team: [name], DO as PCP - General (Family Medicine) [name], MD as Consulting Physician (Obstetrics and Gynecology) [name], MD (Inactive) as Consulting Physician (Gastroenterology) Ladene Artist, MD as Consulting Physician (Gastroenterology) Lyndal Pulley, DO as Consulting Physician (Family Medicine) Allyn Kenner, MD (Dermatology)  Indicate any recent Medical Services you may have received from other than [facility] providers in the past year (date may be approximate).     Assessment:   This is a routine wellness examination for Victoria Bryant.  Hearing/Vision screen No results found.  Dietary issues and exercise activities discussed: Exercise limited by: None identified   Goals Addressed   None   Depression Screen    12/29/2022   10:55  AM 11/10/2022    8:03 AM 05/25/2022    8:35 AM 12/23/2021    9:35 AM 11/10/2021    8:56 AM 12/15/2020    10:26 AM 11/26/2020    8:19 AM  PHQ 2/9 Scores  PHQ - 2 Score 0 0 0 0 0 0 0  PHQ- 9 Score 0 0 0  0      Fall Risk    12/29/2022   10:57 AM 12/12/2022    1:32 PM 11/08/2022    5:56 PM 12/23/2021    9:37 AM 11/10/2021    8:51 AM  Fall Risk   Falls in the past year? 0 0 0 0 0  Number falls in past yr: 0 0  0 0  Injury with Fall? 0 0  0 0  Risk for fall due to : No Fall Risks    No Fall Risks  Follow up Falls evaluation completed   Falls prevention discussed Falls evaluation completed    FALL RISK PREVENTION PERTAINING TO THE HOME:  Any stairs in or around the home? No  If so, are there any without handrails? No  Home free of loose throw rugs in walkways, pet beds, electrical cords, etc? Yes  Adequate lighting in your home to reduce risk of falls? Yes   ASSISTIVE DEVICES UTILIZED TO PREVENT FALLS:  Life alert? No  Use of a cane, walker or w/c? No  Grab bars in the bathroom? Yes  Shower chair or bench in shower? No  Elevated toilet seat or a handicapped toilet? Yes   TIMED UP AND GO:  Was the test performed? No .  Length of time to ambulate 10 feet: n/a sec.     Cognitive Function:    10/24/2018   10:14 AM 10/21/2017    8:39 AM  MMSE - Mini Mental State Exam  Orientation to time 5 5  Orientation to Place 5 5  Registration 3 3  Attention/ Calculation 5 5  Recall 2 3  Language- name 2 objects 2 2  Language- repeat 1 1  Language- follow 3 step command 3 3  Language- read & follow direction 1 1  Write a sentence 1 1  Copy design 1 1  Total score 29 30        12/29/2022   10:58 AM 12/23/2021    9:39 AM  6CIT Screen  What Year? 0 points 0 points  What month? 0 points 0 points  What time? 0 points 0 points  Count back from 20 4 points 0 points  Months in reverse 0 points 0 points  Repeat phrase 0 points 0 points  Total Score 4 points 0 points    Immunizations Immunization History  Administered Date(s) Administered   Fluad Quad(high Dose 65+) 08/07/2019,  08/22/2020   Influenza Split 09/20/2011, 09/25/2012   Influenza Whole 08/27/2010   Influenza, High Dose Seasonal PF 10/06/2015, 12/26/2017, 09/08/2018   Influenza,inj,Quad PF,6+ Mos 09/26/2013   Influenza-Unspecified 09/17/2014, 09/05/2021, 09/05/2022   Moderna Sars-Covid-2 Vaccination 02/16/2020, 03/15/2020, 10/03/2020   Pneumococcal Conjugate-13 10/06/2015   Pneumococcal Polysaccharide-23 09/20/2011   Td 05/26/2009   Tdap 04/05/2021   Zoster Recombinat (Shingrix) 02/08/2022   Zoster, Live 09/25/2012    TDAP status: Up to date  Flu Vaccine status: Up to date  Pneumococcal vaccine status: Up to date  Covid-19 vaccine status: Completed vaccines  Qualifies for Shingles Vaccine? Yes   Zostavax completed No   Shingrix Completed?: Yes  Screening Tests Health Maintenance  Topic Date Due  MAMMOGRAM  01/13/2023   DEXA SCAN  11/16/2023   Medicare Annual Wellness (AWV)  12/30/2023   DTaP/Tdap/Td (3 - Td or Tdap) 04/06/2031   Pneumonia Vaccine 44+ Years old  Completed   INFLUENZA VACCINE  Completed   Hepatitis C Screening  Completed   HPV VACCINES  Aged Out   COVID-19 Vaccine  Discontinued   Zoster Vaccines- Shingrix  Discontinued    Health Maintenance  There are no preventive care reminders to display for this patient.   Colorectal cancer screening: No longer required.   Mammogram status: No longer required due to age.  Bone Density status: Completed 11/15/2018. Results reflect: Bone density results: NORMAL. Repeat every 2-5 years.  Lung Cancer Screening: (Low Dose CT Chest recommended if Age 18-80 years, 30 pack-year currently smoking OR have quit w/in 15years.) does not qualify.   Lung Cancer Screening Referral: n/a  Additional Screening:  Hepatitis C Screening: does qualify; Completed 10/06/2016  Vision Screening: Recommended annual ophthalmology exams for early detection of glaucoma and other disorders of the eye. Is the patient up to date with their annual  eye exam?  Yes  Who is the provider or what is the name of the office in which the patient attends annual eye exams? [facility] If pt is not established with a provider, would they like to be referred to a provider to establish care? No .   Dental Screening: Recommended annual dental exams for proper oral hygiene  Community Resource Referral / Chronic Care Management: CRR required this visit?  No   CCM required this visit?  No      Plan:     I have personally reviewed and noted the following in the patient's chart:   Medical and social history Use of alcohol, tobacco or illicit drugs  Current medications and supplements including opioid prescriptions. Patient is not currently taking opioid prescriptions. Functional ability and status Nutritional status Physical activity Advanced directives List of other physicians Hospitalizations, surgeries, and ER visits in previous 12 months Vitals Screenings to include cognitive, depression, and falls Referrals and appointments  In addition, I have reviewed and discussed with patient certain preventive protocols, quality metrics, and best practice recommendations. A written personalized care plan for preventive services as well as general preventive health recommendations were provided to patient.     Beatrix Fetters, [place]   12/29/2022   Nurse Notes: Non-Face to Face or Face to Face 5 minute visit Encounter    Victoria Bryant , Thank you for taking time to come for your Medicare Wellness Visit. I appreciate your ongoing commitment to your health goals. Please review the following plan we discussed and let me know if I can assist you in the future.   These are the goals we discussed:  Goals      Increase physical activity     Increase activity.      Patient Stated     Maintain healthy lifestyle.      Patient Stated     Lose weight and exercise more         This is a list of the screening recommended for you and due dates:   Health Maintenance  Topic Date Due   Mammogram  01/13/2023   DEXA scan (bone density measurement)  11/16/2023   Medicare Annual Wellness Visit  12/30/2023   DTaP/Tdap/Td vaccine (3 - Td or Tdap) 04/06/2031   Pneumonia Vaccine  Completed   Flu Shot  Completed   Hepatitis C Screening:  USPSTF Recommendation to screen - Ages 35-79 yo.  Completed   HPV Vaccine  Aged Out   COVID-19 Vaccine  Discontinued   Zoster (Shingles) Vaccine  Discontinued

## 2022-12-29 NOTE — Patient Instructions (Signed)

## 2022-12-30 ENCOUNTER — Ambulatory Visit: Payer: Medicare Other

## 2023-01-18 DIAGNOSIS — Z1231 Encounter for screening mammogram for malignant neoplasm of breast: Secondary | ICD-10-CM | POA: Diagnosis not present

## 2023-01-18 LAB — HM MAMMOGRAPHY

## 2023-02-02 DIAGNOSIS — Z1283 Encounter for screening for malignant neoplasm of skin: Secondary | ICD-10-CM | POA: Diagnosis not present

## 2023-02-02 DIAGNOSIS — L820 Inflamed seborrheic keratosis: Secondary | ICD-10-CM | POA: Diagnosis not present

## 2023-02-02 DIAGNOSIS — B078 Other viral warts: Secondary | ICD-10-CM | POA: Diagnosis not present

## 2023-02-02 DIAGNOSIS — L821 Other seborrheic keratosis: Secondary | ICD-10-CM | POA: Diagnosis not present

## 2023-02-02 DIAGNOSIS — D2272 Melanocytic nevi of left lower limb, including hip: Secondary | ICD-10-CM | POA: Diagnosis not present

## 2023-02-02 DIAGNOSIS — Z08 Encounter for follow-up examination after completed treatment for malignant neoplasm: Secondary | ICD-10-CM | POA: Diagnosis not present

## 2023-02-02 DIAGNOSIS — Z85820 Personal history of malignant melanoma of skin: Secondary | ICD-10-CM | POA: Diagnosis not present

## 2023-02-02 DIAGNOSIS — D485 Neoplasm of uncertain behavior of skin: Secondary | ICD-10-CM | POA: Diagnosis not present

## 2023-02-02 DIAGNOSIS — D225 Melanocytic nevi of trunk: Secondary | ICD-10-CM | POA: Diagnosis not present

## 2023-03-18 DIAGNOSIS — M7062 Trochanteric bursitis, left hip: Secondary | ICD-10-CM | POA: Diagnosis not present

## 2023-03-18 DIAGNOSIS — M25561 Pain in right knee: Secondary | ICD-10-CM | POA: Diagnosis not present

## 2023-04-11 DIAGNOSIS — M25552 Pain in left hip: Secondary | ICD-10-CM | POA: Diagnosis not present

## 2023-04-11 DIAGNOSIS — M25561 Pain in right knee: Secondary | ICD-10-CM | POA: Diagnosis not present

## 2023-04-13 DIAGNOSIS — J019 Acute sinusitis, unspecified: Secondary | ICD-10-CM | POA: Diagnosis not present

## 2023-04-13 DIAGNOSIS — R519 Headache, unspecified: Secondary | ICD-10-CM | POA: Diagnosis not present

## 2023-04-13 DIAGNOSIS — N390 Urinary tract infection, site not specified: Secondary | ICD-10-CM | POA: Diagnosis not present

## 2023-04-13 DIAGNOSIS — H9203 Otalgia, bilateral: Secondary | ICD-10-CM | POA: Diagnosis not present

## 2023-04-27 ENCOUNTER — Ambulatory Visit (INDEPENDENT_AMBULATORY_CARE_PROVIDER_SITE_OTHER): Payer: Medicare Other | Admitting: Family Medicine

## 2023-04-27 ENCOUNTER — Encounter: Payer: Self-pay | Admitting: Family Medicine

## 2023-04-27 VITALS — BP 132/82 | HR 73 | Temp 97.6°F | Ht 61.0 in | Wt 147.6 lb

## 2023-04-27 DIAGNOSIS — G479 Sleep disorder, unspecified: Secondary | ICD-10-CM

## 2023-04-27 DIAGNOSIS — Z79899 Other long term (current) drug therapy: Secondary | ICD-10-CM

## 2023-04-27 DIAGNOSIS — I700 Atherosclerosis of aorta: Secondary | ICD-10-CM | POA: Diagnosis not present

## 2023-04-27 DIAGNOSIS — Z0000 Encounter for general adult medical examination without abnormal findings: Secondary | ICD-10-CM | POA: Diagnosis not present

## 2023-04-27 DIAGNOSIS — K219 Gastro-esophageal reflux disease without esophagitis: Secondary | ICD-10-CM | POA: Diagnosis not present

## 2023-04-27 DIAGNOSIS — M5386 Other specified dorsopathies, lumbar region: Secondary | ICD-10-CM

## 2023-04-27 DIAGNOSIS — F419 Anxiety disorder, unspecified: Secondary | ICD-10-CM

## 2023-04-27 DIAGNOSIS — N3946 Mixed incontinence: Secondary | ICD-10-CM

## 2023-04-27 DIAGNOSIS — N3010 Interstitial cystitis (chronic) without hematuria: Secondary | ICD-10-CM | POA: Diagnosis not present

## 2023-04-27 LAB — CBC WITH DIFFERENTIAL/PLATELET
Basophils Absolute: 0.1 10*3/uL (ref 0.0–0.1)
Basophils Relative: 0.6 % (ref 0.0–3.0)
Eosinophils Absolute: 0.2 10*3/uL (ref 0.0–0.7)
Eosinophils Relative: 2.2 % (ref 0.0–5.0)
HCT: 40.7 % (ref 36.0–46.0)
Hemoglobin: 13.2 g/dL (ref 12.0–15.0)
Lymphocytes Relative: 40.1 % (ref 12.0–46.0)
Lymphs Abs: 3.9 10*3/uL (ref 0.7–4.0)
MCHC: 32.5 g/dL (ref 30.0–36.0)
MCV: 93.3 fl (ref 78.0–100.0)
Monocytes Absolute: 0.6 10*3/uL (ref 0.1–1.0)
Monocytes Relative: 6.4 % (ref 3.0–12.0)
Neutro Abs: 4.9 10*3/uL (ref 1.4–7.7)
Neutrophils Relative %: 50.7 % (ref 43.0–77.0)
Platelets: 236 10*3/uL (ref 150.0–400.0)
RBC: 4.36 Mil/uL (ref 3.87–5.11)
RDW: 14 % (ref 11.5–15.5)
WBC: 9.6 10*3/uL (ref 4.0–10.5)

## 2023-04-27 LAB — COMPREHENSIVE METABOLIC PANEL
ALT: 20 U/L (ref 0–35)
AST: 18 U/L (ref 0–37)
Albumin: 3.9 g/dL (ref 3.5–5.2)
Alkaline Phosphatase: 63 U/L (ref 39–117)
BUN: 12 mg/dL (ref 6–23)
CO2: 30 mEq/L (ref 19–32)
Calcium: 9.2 mg/dL (ref 8.4–10.5)
Chloride: 103 mEq/L (ref 96–112)
Creatinine, Ser: 0.73 mg/dL (ref 0.40–1.20)
GFR: 79.05 mL/min (ref >60.00)
Glucose, Bld: 82 mg/dL (ref 70–99)
Potassium: 4.1 mEq/L (ref 3.5–5.1)
Sodium: 141 mEq/L (ref 135–145)
Total Bilirubin: 0.4 mg/dL (ref 0.2–1.2)
Total Protein: 6.2 g/dL (ref 6.0–8.3)

## 2023-04-27 LAB — VITAMIN D 25 HYDROXY (VIT D DEFICIENCY, FRACTURES): VITD: 49.08 ng/mL (ref 30.00–100.00)

## 2023-04-27 LAB — LIPID PANEL
Cholesterol: 202 mg/dL — ABNORMAL HIGH (ref 0–200)
HDL: 72.4 mg/dL (ref >39.00)
LDL Cholesterol: 102 mg/dL — ABNORMAL HIGH (ref 0–99)
NonHDL: 129.23
Total CHOL/HDL Ratio: 3
Triglycerides: 138 mg/dL (ref 0.0–149.0)
VLDL: 27.6 mg/dL (ref 0.0–40.0)

## 2023-04-27 LAB — HEMOGLOBIN A1C: Hgb A1c MFr Bld: 6 % (ref 4.6–6.5)

## 2023-04-27 LAB — TSH: TSH: 5.04 u[IU]/mL (ref 0.35–5.50)

## 2023-04-27 LAB — B12 AND FOLATE PANEL
Folate: 10.1 ng/mL (ref >5.9)
Vitamin B-12: 852 pg/mL (ref 211–911)

## 2023-04-27 LAB — MAGNESIUM: Magnesium: 1.9 mg/dL (ref 1.5–2.5)

## 2023-04-27 MED ORDER — GABAPENTIN 100 MG PO CAPS: 100.0000 mg | ORAL_CAPSULE | Freq: Every day | ORAL | 1 refills | Status: DC

## 2023-04-27 MED ORDER — PANTOPRAZOLE SODIUM 40 MG PO TBEC: 40.0000 mg | DELAYED_RELEASE_TABLET | Freq: Every day | ORAL | 3 refills | Status: DC

## 2023-04-27 MED ORDER — ALPRAZOLAM 0.5 MG PO TABS: 0.5000 mg | ORAL_TABLET | Freq: Two times a day (BID) | ORAL | 1 refills | Status: DC | PRN

## 2023-04-27 MED ORDER — TOLTERODINE TARTRATE ER 4 MG PO CP24: 4.0000 mg | ORAL_CAPSULE | Freq: Every day | ORAL | 1 refills | Status: DC | PRN

## 2023-04-27 MED ORDER — PRAVASTATIN SODIUM 20 MG PO TABS: 20.0000 mg | ORAL_TABLET | Freq: Every day | ORAL | 3 refills | Status: DC

## 2023-04-27 MED ORDER — MELOXICAM 15 MG PO TABS: 15.0000 mg | ORAL_TABLET | Freq: Every day | ORAL | 1 refills | Status: DC

## 2023-04-27 MED ORDER — BACLOFEN 10 MG PO TABS: 5.0000 mg | ORAL_TABLET | Freq: Two times a day (BID) | ORAL | 1 refills | Status: DC

## 2023-04-27 MED ORDER — HYOSCYAMINE SULFATE 0.125 MG SL SUBL: 0.1250 mg | SUBLINGUAL_TABLET | Freq: Three times a day (TID) | SUBLINGUAL | 1 refills | Status: DC | PRN

## 2023-04-27 NOTE — Progress Notes (Signed)
Patient ID: Victoria Bryant, female  DOB: May 25, 1946, 77 y.o.   MRN: [id number] Patient Care Team    Relationship Specialty Notifications Start End  Natalia Leatherwood, DO PCP - General Family Medicine  06/18/16   Noland Fordyce, MD Consulting Physician Obstetrics and Gynecology  10/06/16   Dorena Cookey, MD (Inactive) Consulting Physician Gastroenterology  10/08/16   Meryl Dare, MD Consulting Physician Gastroenterology  10/24/18   Judi Saa, DO Consulting Physician Family Medicine  10/24/18   Nita Sells, MD  Dermatology  10/24/18     Chief Complaint  Patient presents with   Annual Exam    Subjective: Victoria Bryant is a 77 y.o.  Female  present for CPE and [facility] All past medical history, surgical history, allergies, family history, immunizations, medications and social history were updated in the electronic medical record today. All recent labs, ED visits and hospitalizations within the last year were reviewed.  Health maintenance:  Colonoscopy:> 75> no additional screenings Mammogram:01/2023.  Cervical cancer screening:not indicated Immunizations: tdap UTD 2022, influ UTD 09/2022,due, PNA series completed. Shingrix printed.  Infectious disease screening: hep c  screen  completed  DEXA: Bone density completed 11/2018 repeat in 5 years. Assistive device: none Oxygen WUJ:WJXB Patient has a Dental home. Hospitalizations/ED visits: reviewed  anxiety/sleep disturbance: Patient reports she is feeling great.  She uses Xanax as needed when having increased anxiety or difficulty falling asleep.   She had been prescribed Lexapro in the past for her depression/anxiety, but has no longer required.   Atherosclerosis of aorta [facility]) Patient reports she has been watching her diet and going to the gym.  She is now compliant with pravastatin. She did not desire to take Crestor.   GERD: Condition is stable on protonix daily. Unable to wean off without recurrent symptoms. She  is supplementing b12 and vit d.    Pain emptying bladder/urinary incontinence:  She reports detrol La is working well for her urinary incontinence.     Scoliosis/left sciatica lumbar region degenerative disease:  Reports her pain is well-controlled with Mobic and baclofen when needed.     04/27/2023    8:34 AM 12/29/2022   10:55 AM 11/10/2022    8:03 AM 05/25/2022    8:35 AM 12/23/2021    9:35 AM  Depression screen PHQ 2/9  Decreased Interest 0 0 0 0 0  Down, Depressed, Hopeless 0 0 0 0 0  PHQ - 2 Score 0 0 0 0 0  Altered sleeping 0 0 0 0   Tired, decreased energy 0 0 0 0   Change in appetite 0 0 0 0   Feeling bad or failure about yourself  0 0 0 0   Trouble concentrating 0 0 0 0   Moving slowly or fidgety/restless 0 0 0 0   Suicidal thoughts 0 0 0 0   PHQ-9 Score 0 0 0 0   Difficult doing work/chores Not difficult at all          04/27/2023    8:34 AM 11/10/2022    8:07 AM 05/25/2022    8:35 AM 11/10/2021    8:56 AM  GAD 7 : Generalized Anxiety Score  Nervous, Anxious, on Edge 0 0 0 0  Control/stop worrying 0 1 0 0  Worry too much - different things 0 0 0 0  Trouble relaxing 0 0 0 0  Restless 0 0 0 0  Easily annoyed or irritable 0 0 0 0  Afraid -  awful might happen 0 0 0 0  Total GAD 7 Score 0 1 0 0  Anxiety Difficulty Not difficult at all       Immunization History  Administered Date(s) Administered   Fluad Quad(high Dose 65+) 08/07/2019, 08/22/2020   Influenza Split 09/20/2011, 09/25/2012   Influenza Whole 08/27/2010   Influenza, High Dose Seasonal PF 10/06/2015, 12/26/2017, 09/08/2018   Influenza,inj,Quad PF,6+ Mos 09/26/2013   Influenza-Unspecified 09/17/2014, 09/05/2021, 09/05/2022   Moderna Sars-Covid-2 Vaccination 02/16/2020, 03/15/2020, 10/03/2020   Pneumococcal Conjugate-13 10/06/2015   Pneumococcal Polysaccharide-23 09/20/2011   Td 05/26/2009   Tdap 04/05/2021   Zoster Recombinat (Shingrix) 02/08/2022   Zoster, Live 09/25/2012     Past Medical  History:  Diagnosis Date   Arthritis    COLONIC POLYPS, HX OF 04/21/2007   Combined forms of age-related cataract of left eye 07/19/2019   COVID-19 11/2021   mild case   DEPRESSION 05/26/2009   Diverticulitis    Epiretinal membrane, left eye    GERD 04/08/2008   Glaucoma suspect of both eyes    [facility]   Interstitial cystitis    ? 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/2007   MENOPAUSAL SYNDROME 04/21/2007   NECK PAIN, CHRONIC 04/21/2007   Posterior vitreous detachment of both eyes    [facility]   Pseudophakia of both eyes    [facility]   Regular astigmatism, left eye 07/19/2019   Unspecified disorder of refraction    both eyes   Allergies  Allergen Reactions   Naproxen Diarrhea   Paroxetine     REACTION: unspecified   Promethazine     Hyperactive/jittery/insomnia   Clarithromycin Nausea Only   Doxycycline Hyclate Nausea And Vomiting   Erythromycin Ethylsuccinate Nausea Only   Lexapro [Escitalopram Oxalate] Other (See Comments)    Paresthesia    Past Surgical History:  Procedure Laterality Date   COLONOSCOPY  x 2   First time polyps, second time no polyps: recall 10 yrs (next is approx 2024 per pt report)   INCISION AND DRAINAGE ABSCESS ANAL     POLYPECTOMY     TONSILLECTOMY     UPPER GASTROINTESTINAL ENDOSCOPY     VAGINAL HYSTERECTOMY     Nonmalignant reason.  Ovaries still in.   Family History  Problem Relation Age of Onset   Bladder Cancer Mother    Lung cancer Mother    Breast cancer Paternal Aunt    ALS Daughter    Fibromyalgia Sister    Heart disease Brother    Alcohol abuse Brother    Colon cancer Neg Hx    Colon polyps Neg Hx    Esophageal cancer Neg Hx    Rectal cancer Neg Hx    Stomach cancer Neg Hx    Social History   Social History Narrative   Married, one daughter lives next door to her.   Retired Acupuncturist.   Tob: 20 pack yr hx,  quit age 89.   No alc.    Allergies as of 04/27/2023       Reactions   Naproxen Diarrhea   Paroxetine    REACTION: unspecified   Promethazine    Hyperactive/jittery/insomnia   Clarithromycin Nausea Only   Doxycycline Hyclate Nausea And Vomiting   Erythromycin Ethylsuccinate Nausea Only   Lexapro [escitalopram Oxalate] Other (See Comments)   Paresthesia        Medication List  Accurate as of Apr 27, 2023 10:39 AM. If you have any questions, ask your nurse or doctor.          ALPRAZolam 0.5 MG tablet Commonly known as: Xanax Take 1 tablet (0.5 mg total) by mouth 2 (two) times daily as needed for anxiety.   baclofen 10 MG tablet Commonly known as: LIORESAL Take 0.5 tablets (5 mg total) by mouth 2 (two) times daily.   fluticasone 50 MCG/ACT nasal spray Commonly known as: FLONASE Place 1 spray into both nostrils daily.   gabapentin 100 MG capsule Commonly known as: NEURONTIN Take 1 capsule (100 mg total) by mouth at bedtime. As needed   hyoscyamine 0.125 MG SL tablet Commonly known as: LEVSIN SL Place 1 tablet (0.125 mg total) under the tongue every 8 (eight) hours as needed.   JUICE PLUS FIBRE PO Take by mouth.   meloxicam 15 MG tablet Commonly known as: MOBIC Take 1 tablet (15 mg total) by mouth daily. As needed   pantoprazole 40 MG tablet Commonly known as: PROTONIX Take 1 tablet (40 mg total) by mouth daily.   pravastatin 20 MG tablet Commonly known as: PRAVACHOL Take 1 tablet (20 mg total) by mouth daily.   RA PROBIOTIC GUMMIES PO Take by mouth as needed.   tolterodine 4 MG 24 hr capsule Commonly known as: DETROL LA Take 1 capsule (4 mg total) by mouth daily as needed.   VITAMIN B 12 PO Take by mouth.   vitamin C 1000 MG tablet Take 1,000 mg by mouth daily.   Vitamin D (Cholecalciferol) 25 MCG (1000 UT) Caps Take 2 capsules by mouth daily.        All past medical history, surgical history, allergies, family history,  immunizations andmedications were updated in the EMR today and reviewed under the history and medication portions of their EMR.     No results found for this or any previous visit (from the past 2160 hour(s)).  No results found.   ROS 14 pt review of systems performed and negative (unless mentioned in an HPI)  Objective: BP 132/82   Pulse 73   Temp 97.6 F (36.4 C)   Ht 5\' 1"  (1.549 m)   Wt 147 lb 9.6 oz (67 kg)   SpO2 100%   BMI 27.89 kg/m  Physical Exam Vitals and nursing note reviewed.  Constitutional:      General: She is not in acute distress.    Appearance: Normal appearance. She is not ill-appearing or toxic-appearing.  HENT:     Head: Normocephalic and atraumatic.     Right Ear: Tympanic membrane, ear canal and external ear normal. There is no impacted cerumen.     Left Ear: Tympanic membrane, ear canal and external ear normal. There is no impacted cerumen.     Nose: No congestion or rhinorrhea.     Mouth/Throat:     Mouth: Mucous membranes are moist.     Pharynx: Oropharynx is clear. No oropharyngeal exudate or posterior oropharyngeal erythema.  Eyes:     General: No scleral icterus.       Right eye: No discharge.        Left eye: No discharge.     Extraocular Movements: Extraocular movements intact.     Conjunctiva/sclera: Conjunctivae normal.     Pupils: Pupils are equal, round, and reactive to light.  Cardiovascular:     Rate and Rhythm: Normal rate and regular rhythm.     Pulses: Normal pulses.  Heart sounds: Normal heart sounds. No murmur heard.    No friction rub. No gallop.  Pulmonary:     Effort: Pulmonary effort is normal. No respiratory distress.     Breath sounds: Normal breath sounds. No stridor. No wheezing, rhonchi or rales.  Chest:     Chest wall: No tenderness.  Abdominal:     General: Abdomen is flat. Bowel sounds are normal. There is no distension.     Palpations: Abdomen is soft. There is no mass.     Tenderness: There is no  abdominal tenderness. There is no right CVA tenderness, left CVA tenderness, guarding or rebound.     Hernia: No hernia is present.  Musculoskeletal:        General: No swelling, tenderness or deformity. Normal range of motion.     Cervical back: Normal range of motion and neck supple. No rigidity or tenderness.     Right lower leg: No edema.     Left lower leg: No edema.  Lymphadenopathy:     Cervical: No cervical adenopathy.  Skin:    General: Skin is warm and dry.     Coloration: Skin is not jaundiced or pale.     Findings: No bruising, erythema, lesion or rash.  Neurological:     General: No focal deficit present.     Mental Status: She is alert and oriented to person, place, and time. Mental status is at baseline.     Cranial Nerves: No cranial nerve deficit.     Sensory: No sensory deficit.     Motor: No weakness.     Coordination: Coordination normal.     Gait: Gait normal.     Deep Tendon Reflexes: Reflexes normal.  Psychiatric:        Mood and Affect: Mood normal.        Behavior: Behavior normal.        Thought Content: Thought content normal.        Judgment: Judgment normal.     No results found.  Assessment/plan: Victoria Bryant is a 77 y.o. female present for CPE and routine chronic conditions pain emptying bladder/urinary incontinence:  Stable Continue Detrol LA.    Scoliosis, left sciatica lumbar region degenerative disease:  Stable Continue baclofen 1/2 tab BID PRN Continue Mobic daily.   Sleep disturbance/anxiety Stable Continue Xanax nightly as needed, when having difficulty sleeping secondary to anxiety. [place] controlled substance database reviewed today and appropriate   Leg cramps Stable Continue gabapentin prn (rarely uses)   GERD long-term current use of proton pump inhibitor therapy Continue Protonix 40 mg daily Vitamin D, B12 and magnesium collected today Lipid screening/Atherosclerosis of aorta (HCC) Lipid panel collected  today Elevated TSH TSH collected today  Routine general medical examination at a health care facility Patient was encouraged to exercise greater than 150 minutes a week. Patient was encouraged to choose a diet filled with fresh fruits and vegetables, and lean meats. AVS provided to patient today for education/recommendation on gender specific health and safety maintenance. Colonoscopy:> 75> no additional screenings Mammogram:01/2023.  Cervical cancer screening:not indicated Immunizations: tdap UTD 2022, influ UTD 09/2022,due, PNA series completed. Shingrix printed.  Infectious disease screening: hep c  screen  completed  DEXA: Bone density completed 11/2018 repeat in 5 years. Assistive device: none  Return in about 24 weeks (around 10/12/2023) for Routine chronic condition follow-up.  Orders Placed This Encounter  Procedures   CBC with Differential/Platelet   Comprehensive metabolic panel   Hemoglobin A1c  Lipid panel   TSH   Vitamin D (25 hydroxy)   B12 and Folate Panel   Magnesium   Meds ordered this encounter  Medications   ALPRAZolam (XANAX) 0.5 MG tablet    Sig: Take 1 tablet (0.5 mg total) by mouth 2 (two) times daily as needed for anxiety.    Dispense:  180 tablet    Refill:  1   baclofen (LIORESAL) 10 MG tablet    Sig: Take 0.5 tablets (5 mg total) by mouth 2 (two) times daily.    Dispense:  45 each    Refill:  1   gabapentin (NEURONTIN) 100 MG capsule    Sig: Take 1 capsule (100 mg total) by mouth at bedtime. As needed    Dispense:  90 capsule    Refill:  1   meloxicam (MOBIC) 15 MG tablet    Sig: Take 1 tablet (15 mg total) by mouth daily. As needed    Dispense:  90 tablet    Refill:  1   pantoprazole (PROTONIX) 40 MG tablet    Sig: Take 1 tablet (40 mg total) by mouth daily.    Dispense:  90 tablet    Refill:  3   pravastatin (PRAVACHOL) 20 MG tablet    Sig: Take 1 tablet (20 mg total) by mouth daily.    Dispense:  90 tablet    Refill:  3   tolterodine  (DETROL LA) 4 MG 24 hr capsule    Sig: Take 1 capsule (4 mg total) by mouth daily as needed.    Dispense:  90 capsule    Refill:  1   hyoscyamine (LEVSIN SL) 0.125 MG SL tablet    Sig: Place 1 tablet (0.125 mg total) under the tongue every 8 (eight) hours as needed.    Dispense:  90 tablet    Refill:  1   Referral Orders  No referral(s) requested today     Electronically signed by: Felix Pacini, DO [facility] [place]

## 2023-04-27 NOTE — Patient Instructions (Signed)
Return in about 24 weeks (around 12/21/2022) for Routine chronic condition follow-up.        Great to see you today.  I have refilled the medication(s) we provide.   If labs were collected, we will inform you of lab results once received either by echart message or telephone call.   - echart message- for normal results that have been seen by the patient already.   - telephone call: abnormal results or if patient has not viewed results in their echart.  

## 2023-05-11 DIAGNOSIS — L821 Other seborrheic keratosis: Secondary | ICD-10-CM | POA: Diagnosis not present

## 2023-05-11 DIAGNOSIS — L700 Acne vulgaris: Secondary | ICD-10-CM | POA: Diagnosis not present

## 2023-05-11 DIAGNOSIS — D485 Neoplasm of uncertain behavior of skin: Secondary | ICD-10-CM | POA: Diagnosis not present

## 2023-06-03 DIAGNOSIS — M1711 Unilateral primary osteoarthritis, right knee: Secondary | ICD-10-CM | POA: Diagnosis not present

## 2023-06-08 DIAGNOSIS — N390 Urinary tract infection, site not specified: Secondary | ICD-10-CM | POA: Diagnosis not present

## 2023-06-08 DIAGNOSIS — G501 Atypical facial pain: Secondary | ICD-10-CM | POA: Diagnosis not present

## 2023-06-10 DIAGNOSIS — M1711 Unilateral primary osteoarthritis, right knee: Secondary | ICD-10-CM | POA: Diagnosis not present

## 2023-06-14 DIAGNOSIS — H5712 Ocular pain, left eye: Secondary | ICD-10-CM | POA: Diagnosis not present

## 2023-06-14 DIAGNOSIS — H04129 Dry eye syndrome of unspecified lacrimal gland: Secondary | ICD-10-CM | POA: Diagnosis not present

## 2023-06-17 DIAGNOSIS — M1711 Unilateral primary osteoarthritis, right knee: Secondary | ICD-10-CM | POA: Diagnosis not present

## 2023-07-01 DIAGNOSIS — J029 Acute pharyngitis, unspecified: Secondary | ICD-10-CM | POA: Diagnosis not present

## 2023-07-01 DIAGNOSIS — Z03818 Encounter for observation for suspected exposure to other biological agents ruled out: Secondary | ICD-10-CM | POA: Diagnosis not present

## 2023-07-20 DIAGNOSIS — M1711 Unilateral primary osteoarthritis, right knee: Secondary | ICD-10-CM | POA: Diagnosis not present

## 2023-07-30 DIAGNOSIS — J029 Acute pharyngitis, unspecified: Secondary | ICD-10-CM | POA: Diagnosis not present

## 2023-07-30 DIAGNOSIS — K5792 Diverticulitis of intestine, part unspecified, without perforation or abscess without bleeding: Secondary | ICD-10-CM | POA: Diagnosis not present

## 2023-08-09 ENCOUNTER — Ambulatory Visit: Payer: Medicare Other

## 2023-08-22 DIAGNOSIS — N390 Urinary tract infection, site not specified: Secondary | ICD-10-CM | POA: Diagnosis not present

## 2023-08-22 DIAGNOSIS — K146 Glossodynia: Secondary | ICD-10-CM | POA: Diagnosis not present

## 2023-09-21 DIAGNOSIS — M79671 Pain in right foot: Secondary | ICD-10-CM | POA: Diagnosis not present

## 2023-09-27 DIAGNOSIS — M25561 Pain in right knee: Secondary | ICD-10-CM | POA: Diagnosis not present

## 2023-10-12 ENCOUNTER — Encounter: Payer: Self-pay | Admitting: Family Medicine

## 2023-10-12 ENCOUNTER — Ambulatory Visit: Payer: Medicare Other | Admitting: Family Medicine

## 2023-10-12 VITALS — BP 148/82 | HR 69 | Temp 97.9°F | Wt 141.6 lb

## 2023-10-12 DIAGNOSIS — G479 Sleep disorder, unspecified: Secondary | ICD-10-CM

## 2023-10-12 DIAGNOSIS — K219 Gastro-esophageal reflux disease without esophagitis: Secondary | ICD-10-CM

## 2023-10-12 DIAGNOSIS — K580 Irritable bowel syndrome with diarrhea: Secondary | ICD-10-CM

## 2023-10-12 DIAGNOSIS — I700 Atherosclerosis of aorta: Secondary | ICD-10-CM | POA: Diagnosis not present

## 2023-10-12 DIAGNOSIS — Z23 Encounter for immunization: Secondary | ICD-10-CM | POA: Diagnosis not present

## 2023-10-12 DIAGNOSIS — M5386 Other specified dorsopathies, lumbar region: Secondary | ICD-10-CM

## 2023-10-12 DIAGNOSIS — M4156 Other secondary scoliosis, lumbar region: Secondary | ICD-10-CM | POA: Diagnosis not present

## 2023-10-12 DIAGNOSIS — F419 Anxiety disorder, unspecified: Secondary | ICD-10-CM

## 2023-10-12 MED ORDER — MELOXICAM 15 MG PO TABS: 15.0000 mg | ORAL_TABLET | Freq: Every day | ORAL | 1 refills | Status: DC

## 2023-10-12 MED ORDER — PANTOPRAZOLE SODIUM 40 MG PO TBEC: 40.0000 mg | DELAYED_RELEASE_TABLET | Freq: Every day | ORAL | 3 refills | Status: DC

## 2023-10-12 MED ORDER — TOLTERODINE TARTRATE ER 4 MG PO CP24: 4.0000 mg | ORAL_CAPSULE | Freq: Every day | ORAL | 1 refills | Status: DC | PRN

## 2023-10-12 MED ORDER — PRAVASTATIN SODIUM 20 MG PO TABS: 20.0000 mg | ORAL_TABLET | Freq: Every day | ORAL | 3 refills | Status: DC

## 2023-10-12 MED ORDER — BACLOFEN 10 MG PO TABS: 5.0000 mg | ORAL_TABLET | Freq: Two times a day (BID) | ORAL | 1 refills | Status: DC

## 2023-10-12 MED ORDER — ALPRAZOLAM 0.5 MG PO TABS: 0.5000 mg | ORAL_TABLET | Freq: Two times a day (BID) | ORAL | 1 refills | Status: DC | PRN

## 2023-10-12 MED ORDER — HYOSCYAMINE SULFATE 0.125 MG SL SUBL: 0.1250 mg | SUBLINGUAL_TABLET | Freq: Three times a day (TID) | SUBLINGUAL | 1 refills | Status: DC | PRN

## 2023-10-12 MED ORDER — GABAPENTIN 100 MG PO CAPS: 100.0000 mg | ORAL_CAPSULE | Freq: Every day | ORAL | 1 refills | Status: DC

## 2023-10-12 MED ORDER — FLUTICASONE PROPIONATE 50 MCG/ACT NA SUSP: 1.0000 | Freq: Every day | NASAL | 3 refills | Status: DC

## 2023-10-12 NOTE — Progress Notes (Signed)
Patient ID: Victoria Bryant, female  DOB: [birth year], 77 y.o.   MRN: [id number] Patient Care Team    Relationship Specialty Notifications Start End  Natalia Leatherwood, DO PCP - General Family Medicine  06/18/16   Noland Fordyce, MD Consulting Physician Obstetrics and Gynecology  10/06/16   Dorena Cookey, MD (Inactive) Consulting Physician Gastroenterology  10/08/16   Meryl Dare, MD Consulting Physician Gastroenterology  10/24/18   Judi Saa, DO Consulting Physician Family Medicine  10/24/18   Nita Sells, MD  Dermatology  10/24/18     Chief Complaint  Patient presents with   Anxiety    cmc    Subjective: Victoria Bryant is a 77 y.o.  Female  present for condition management All past medical history, surgical history, allergies, family history, immunizations, medications and social history were updated in the electronic medical record today. All recent labs, ED visits and hospitalizations within the last year were reviewed.  anxiety/sleep disturbance: Patient reports she is feeling well.  She uses Xanax as needed when having increased anxiety or difficulty falling asleep.   She had been prescribed Lexapro in the past for her depression/anxiety, but has no longer required.   Atherosclerosis of aorta [facility]) Patient reports she has been watching her diet and going to the gym.  She is compliant with pravastatin. She did not desire to take Crestor.   GERD: Condition is well-controlled on protonix daily. Unable to wean off without recurrent symptoms. She is supplementing b12 and vit d.    Pain emptying bladder/urinary incontinence:  She reports detrol La is working well for her urinary incontinence.     Scoliosis/left sciatica lumbar region degenerative disease:  Reports her pain is still well-controlled with Mobic and baclofen when needed.     10/12/2023    8:21 AM 04/27/2023    8:34 AM 12/29/2022   10:55 AM 11/10/2022    8:03 AM 05/25/2022    8:35 AM  Depression  screen PHQ 2/9  Decreased Interest 0 0 0 0 0  Down, Depressed, Hopeless 0 0 0 0 0  PHQ - 2 Score 0 0 0 0 0  Altered sleeping 0 0 0 0 0  Tired, decreased energy 0 0 0 0 0  Change in appetite 0 0 0 0 0  Feeling bad or failure about yourself  0 0 0 0 0  Trouble concentrating 0 0 0 0 0  Moving slowly or fidgety/restless 0 0 0 0 0  Suicidal thoughts 0 0 0 0 0  PHQ-9 Score 0 0 0 0 0  Difficult doing work/chores Not difficult at all Not difficult at all         10/12/2023    8:22 AM 04/27/2023    8:34 AM 11/10/2022    8:07 AM 05/25/2022    8:35 AM  GAD 7 : Generalized Anxiety Score  Nervous, Anxious, on Edge 0 0 0 0  Control/stop worrying 0 0 1 0  Worry too much - different things 0 0 0 0  Trouble relaxing 0 0 0 0  Restless 0 0 0 0  Easily annoyed or irritable 0 0 0 0  Afraid - awful might happen 0 0 0 0  Total GAD 7 Score 0 0 1 0  Anxiety Difficulty Not difficult at all Not difficult at all      Immunization History  Administered Date(s) Administered   Fluad Quad(high Dose 65+) 08/07/2019, 08/22/2020   Fluad Trivalent(High Dose 65+) 10/12/2023  Influenza Split 09/20/2011, 09/25/2012   Influenza Whole 08/27/2010   Influenza, High Dose Seasonal PF 10/06/2015, 12/26/2017, 09/08/2018   Influenza,inj,Quad PF,6+ Mos 09/26/2013   Influenza-Unspecified 09/17/2014, 09/05/2021, 09/05/2022   Moderna Sars-Covid-2 Vaccination 02/16/2020, 03/15/2020, 10/03/2020   Pneumococcal Conjugate-13 10/06/2015   Pneumococcal Polysaccharide-23 09/20/2011   Td 05/26/2009   Tdap 04/05/2021   Zoster Recombinant(Shingrix) 02/08/2022   Zoster, Live 09/25/2012     Past Medical History:  Diagnosis Date   Arthritis    COLONIC POLYPS, HX OF 04/21/2007   Combined forms of age-related cataract of left eye 07/19/2019   COVID-19 11/2021   mild case   DEPRESSION 05/26/2009   Diverticulitis    Epiretinal membrane, left eye    GERD 04/08/2008   Glaucoma suspect of both eyes    [facility]    Interstitial cystitis    ? 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/2007   MENOPAUSAL SYNDROME 04/21/2007   NECK PAIN, CHRONIC 04/21/2007   Posterior vitreous detachment of both eyes    [facility]   Pseudophakia of both eyes    [facility]   Regular astigmatism, left eye 07/19/2019   Unspecified disorder of refraction    both eyes   Allergies  Allergen Reactions   Naproxen Diarrhea   Paroxetine     REACTION: unspecified   Promethazine     Hyperactive/jittery/insomnia   Clarithromycin Nausea Only   Doxycycline Hyclate Nausea And Vomiting   Erythromycin Ethylsuccinate Nausea Only   Lexapro [Escitalopram Oxalate] Other (See Comments)    Paresthesia    Past Surgical History:  Procedure Laterality Date   COLONOSCOPY  x 2   First time polyps, second time no polyps: recall 10 yrs (next is approx 2024 per pt report)   INCISION AND DRAINAGE ABSCESS ANAL     POLYPECTOMY     TONSILLECTOMY     UPPER GASTROINTESTINAL ENDOSCOPY     VAGINAL HYSTERECTOMY     Nonmalignant reason.  Ovaries still in.   Family History  Problem Relation Age of Onset   Bladder Cancer Mother    Lung cancer Mother    Breast cancer Paternal Aunt    ALS Daughter    Fibromyalgia Sister    Heart disease Brother    Alcohol abuse Brother    Colon cancer Neg Hx    Colon polyps Neg Hx    Esophageal cancer Neg Hx    Rectal cancer Neg Hx    Stomach cancer Neg Hx    Social History   Social History Narrative   Married, one daughter lives next door to her.   Retired Acupuncturist.   Tob: 20 pack yr hx, quit age 74.   No alc.    Allergies as of 10/12/2023       Reactions   Naproxen Diarrhea   Paroxetine    REACTION: unspecified   Promethazine    Hyperactive/jittery/insomnia   Clarithromycin Nausea Only   Doxycycline Hyclate Nausea And Vomiting   Erythromycin Ethylsuccinate Nausea Only   Lexapro [escitalopram Oxalate]  Other (See Comments)   Paresthesia        Medication List        Accurate as of October 12, 2023 12:33 PM. If you have any questions, ask your nurse or doctor.          ALPRAZolam 0.5 MG tablet Commonly known as: Xanax Take 1 tablet (0.5 mg total) by mouth 2 (two)  times daily as needed for anxiety.   baclofen 10 MG tablet Commonly known as: LIORESAL Take 0.5 tablets (5 mg total) by mouth 2 (two) times daily.   fluticasone 50 MCG/ACT nasal spray Commonly known as: FLONASE Place 1 spray into both nostrils daily.   gabapentin 100 MG capsule Commonly known as: NEURONTIN Take 1 capsule (100 mg total) by mouth at bedtime. As needed   hyoscyamine 0.125 MG SL tablet Commonly known as: LEVSIN SL Place 1 tablet (0.125 mg total) under the tongue every 8 (eight) hours as needed.   JUICE PLUS FIBRE PO Take by mouth.   loteprednol 0.5 % ophthalmic suspension Commonly known as: LOTEMAX 1 drop 3 (three) times daily.   meloxicam 15 MG tablet Commonly known as: MOBIC Take 1 tablet (15 mg total) by mouth daily. As needed   pantoprazole 40 MG tablet Commonly known as: PROTONIX Take 1 tablet (40 mg total) by mouth daily.   pravastatin 20 MG tablet Commonly known as: PRAVACHOL Take 1 tablet (20 mg total) by mouth daily.   RA PROBIOTIC GUMMIES PO Take by mouth as needed.   tolterodine 4 MG 24 hr capsule Commonly known as: DETROL LA Take 1 capsule (4 mg total) by mouth daily as needed.   VITAMIN B 12 PO Take by mouth.   vitamin C 1000 MG tablet Take 1,000 mg by mouth daily.   Vitamin D (Cholecalciferol) 25 MCG (1000 UT) Caps Take 2 capsules by mouth daily.        All past medical history, surgical history, allergies, family history, immunizations andmedications were updated in the EMR today and reviewed under the history and medication portions of their EMR.     No results found for this or any previous visit (from the past 2160 hour(s)).  No results  found.   ROS 14 pt review of systems performed and negative (unless mentioned in an HPI)  Objective: BP (!) 148/82   Pulse 69   Temp 97.9 F (36.6 C)   Wt 141 lb 9.6 oz (64.2 kg)   SpO2 98%   BMI 26.76 kg/m  Physical Exam Vitals and nursing note reviewed.  Constitutional:      General: She is not in acute distress.    Appearance: Normal appearance. She is not ill-appearing, toxic-appearing or diaphoretic.  HENT:     Head: Normocephalic and atraumatic.  Eyes:     General: No scleral icterus.       Right eye: No discharge.        Left eye: No discharge.     Extraocular Movements: Extraocular movements intact.     Conjunctiva/sclera: Conjunctivae normal.     Pupils: Pupils are equal, round, and reactive to light.  Cardiovascular:     Rate and Rhythm: Normal rate and regular rhythm.  Pulmonary:     Effort: Pulmonary effort is normal. No respiratory distress.     Breath sounds: Normal breath sounds. No wheezing, rhonchi or rales.  Musculoskeletal:     Right lower leg: No edema.     Left lower leg: No edema.  Skin:    General: Skin is warm.     Findings: No rash.  Neurological:     Mental Status: She is alert and oriented to person, place, and time. Mental status is at baseline.     Motor: No weakness.     Gait: Gait normal.  Psychiatric:        Mood and Affect: Mood normal.  Behavior: Behavior normal.        Thought Content: Thought content normal.        Judgment: Judgment normal.    No results found.  Assessment/plan: Victoria Bryant is a 77 y.o. female present for chronic conditions management pain emptying bladder/urinary incontinence:  Stable Continue Detrol LA.    Scoliosis, left sciatica lumbar region degenerative disease:  Stable Continue baclofen 1/2 tab BID PRN Continue Mobic daily.   Sleep disturbance/anxiety Stable Continue Xanax nightly as needed, when having difficulty sleeping secondary to anxiety. [place] controlled  substance database reviewed 10/12/23  Leg cramps Stable Continue gabapentin prn (rarely uses)  GERD long-term current use of proton pump inhibitor therapy Stable Continue Protonix 40 mg daily Vitamin D, B12 and magnesium due next visit  Lipid screening/Atherosclerosis of aorta (HCC) Continue pravastatin. Labs due next visit  Influenza vaccine administered today  Elevated blood pressure today.  Patient states that secondary to the election her blood pressure this morning was 129/74 at home.  She will check her blood pressures over the next week and call in with home pressures to ensure no further intervention is needed.   Return in about 7 months (around 04/27/2024) for cpe (20 min), Routine chronic condition follow-up.  Orders Placed This Encounter  Procedures   Flu Vaccine Trivalent High Dose (Fluad)   Meds ordered this encounter  Medications   ALPRAZolam (XANAX) 0.5 MG tablet    Sig: Take 1 tablet (0.5 mg total) by mouth 2 (two) times daily as needed for anxiety.    Dispense:  180 tablet    Refill:  1   baclofen (LIORESAL) 10 MG tablet    Sig: Take 0.5 tablets (5 mg total) by mouth 2 (two) times daily.    Dispense:  45 each    Refill:  1   gabapentin (NEURONTIN) 100 MG capsule    Sig: Take 1 capsule (100 mg total) by mouth at bedtime. As needed    Dispense:  90 capsule    Refill:  1   hyoscyamine (LEVSIN SL) 0.125 MG SL tablet    Sig: Place 1 tablet (0.125 mg total) under the tongue every 8 (eight) hours as needed.    Dispense:  90 tablet    Refill:  1   meloxicam (MOBIC) 15 MG tablet    Sig: Take 1 tablet (15 mg total) by mouth daily. As needed    Dispense:  90 tablet    Refill:  1   pantoprazole (PROTONIX) 40 MG tablet    Sig: Take 1 tablet (40 mg total) by mouth daily.    Dispense:  90 tablet    Refill:  3   pravastatin (PRAVACHOL) 20 MG tablet    Sig: Take 1 tablet (20 mg total) by mouth daily.    Dispense:  90 tablet    Refill:  3   tolterodine (DETROL  LA) 4 MG 24 hr capsule    Sig: Take 1 capsule (4 mg total) by mouth daily as needed.    Dispense:  90 capsule    Refill:  1   fluticasone (FLONASE) 50 MCG/ACT nasal spray    Sig: Place 1 spray into both nostrils daily.    Dispense:  48 g    Refill:  3   Referral Orders  No referral(s) requested today     Electronically signed by: Felix Pacini, DO [facility] [place]

## 2023-10-12 NOTE — Patient Instructions (Addendum)
Return in about 7 months (around 04/27/2024) for cpe (20 min), Routine chronic condition follow-up.        Great to see you today.  I have refilled the medication(s) we provide.   If labs were collected or images ordered, we will inform you of  results once we have received them and reviewed. We will contact you either by echart message, or telephone call.  Please give ample time to the testing facility, and our office to run,  receive and review results. Please do not call inquiring of results, even if you can see them in your chart. We will contact you as soon as we are able. If it has been over 1 week since the test was completed, and you have not yet heard from [place], then please call us.    - echart message- for normal results that have been seen by the patient already.   - telephone call: abnormal results or if patient has not viewed results in their echart.  If a referral to a specialist was entered for you, please call us in 2 weeks if you have not heard from the specialist office to schedule.

## 2023-10-14 DIAGNOSIS — M1711 Unilateral primary osteoarthritis, right knee: Secondary | ICD-10-CM | POA: Diagnosis not present

## 2023-10-26 DIAGNOSIS — Z03818 Encounter for observation for suspected exposure to other biological agents ruled out: Secondary | ICD-10-CM | POA: Diagnosis not present

## 2023-10-26 DIAGNOSIS — J019 Acute sinusitis, unspecified: Secondary | ICD-10-CM | POA: Diagnosis not present

## 2023-10-26 DIAGNOSIS — J029 Acute pharyngitis, unspecified: Secondary | ICD-10-CM | POA: Diagnosis not present

## 2023-12-08 DIAGNOSIS — J340 Abscess, furuncle and carbuncle of nose: Secondary | ICD-10-CM | POA: Diagnosis not present

## 2024-01-04 ENCOUNTER — Ambulatory Visit (INDEPENDENT_AMBULATORY_CARE_PROVIDER_SITE_OTHER): Payer: Medicare Other | Admitting: *Deleted

## 2024-01-04 DIAGNOSIS — Z0000 Encounter for general adult medical examination without abnormal findings: Secondary | ICD-10-CM

## 2024-01-04 DIAGNOSIS — Z780 Asymptomatic menopausal state: Secondary | ICD-10-CM

## 2024-01-04 NOTE — Patient Instructions (Signed)
Victoria Bryant , Thank you for taking time to come for your Medicare Wellness Visit. I appreciate your ongoing commitment to your health goals. Please review the following plan we discussed and let me know if I can assist you in the future.   Screening recommendations/referrals: Colonoscopy: no longer required Mammogram: up to date Bone Density: Information provided Recommended yearly ophthalmology/optometry visit for glaucoma screening and checkup Recommended yearly dental visit for hygiene and checkup  Vaccinations: Influenza vaccine: up to date Pneumococcal vaccine: up to date Tdap vaccine: up to date Shingles vaccine: Education provided    Advanced directives: yes on file      Preventive Care 65 Years and Older, Female Preventive care refers to lifestyle choices and visits with your health care provider that can promote health and wellness. What does preventive care include? A yearly physical exam. This is also called an annual well check. Dental exams once or twice a year. Routine eye exams. Ask your health care provider how often you should have your eyes checked. Personal lifestyle choices, including: Daily care of your teeth and gums. Regular physical activity. Eating a healthy diet. Avoiding tobacco and drug use. Limiting alcohol use. Practicing safe sex. Taking low-dose aspirin every day. Taking vitamin and mineral supplements as recommended by your health care provider. What happens during an annual well check? The services and screenings done by your health care provider during your annual well check will depend on your age, overall health, lifestyle risk factors, and family history of disease. Counseling  Your health care provider may ask you questions about your: Alcohol use. Tobacco use. Drug use. Emotional well-being. Home and relationship well-being. Sexual activity. Eating habits. History of falls. Memory and ability to understand (cognition). Work and  work Astronomer. Reproductive health. Screening  You may have the following tests or measurements: Height, weight, and BMI. Blood pressure. Lipid and cholesterol levels. These may be checked every 5 years, or more frequently if you are over 40 years old. Skin check. Lung cancer screening. You may have this screening every year starting at age 62 if you have a 30-pack-year history of smoking and currently smoke or have quit within the past 15 years. Fecal occult blood test (FOBT) of the stool. You may have this test every year starting at age 36. Flexible sigmoidoscopy or colonoscopy. You may have a sigmoidoscopy every 5 years or a colonoscopy every 10 years starting at age 2. Hepatitis C blood test. Hepatitis B blood test. Sexually transmitted disease (STD) testing. Diabetes screening. This is done by checking your blood sugar (glucose) after you have not eaten for a while (fasting). You may have this done every 1-3 years. Bone density scan. This is done to screen for osteoporosis. You may have this done starting at age 36. Mammogram. This may be done every 1-2 years. Talk to your health care provider about how often you should have regular mammograms. Talk with your health care provider about your test results, treatment options, and if necessary, the need for more tests. Vaccines  Your health care provider may recommend certain vaccines, such as: Influenza vaccine. This is recommended every year. Tetanus, diphtheria, and acellular pertussis (Tdap, Td) vaccine. You may need a Td booster every 10 years. Zoster vaccine. You may need this after age 40. Pneumococcal 13-valent conjugate (PCV13) vaccine. One dose is recommended after age 38. Pneumococcal polysaccharide (PPSV23) vaccine. One dose is recommended after age 48. Talk to your health care provider about which screenings and vaccines you need and  how often you need them. This information is not intended to replace advice given to you  by your health care provider. Make sure you discuss any questions you have with your health care provider. Document Released: 12/19/2015 Document Revised: 08/11/2016 Document Reviewed: 09/23/2015 Elsevier Interactive Patient Education  2017 [employer].  Fall Prevention in the Home Falls can cause injuries. They can happen to people of all ages. There are many things you can do to make your home safe and to help prevent falls. What can I do on the outside of my home? Regularly fix the edges of walkways and driveways and fix any cracks. Remove anything that might make you trip as you walk through a door, such as a raised step or threshold. Trim any bushes or trees on the path to your home. Use bright outdoor lighting. Clear any walking paths of anything that might make someone trip, such as rocks or tools. Regularly check to see if handrails are loose or broken. Make sure that both sides of any steps have handrails. Any raised decks and porches should have guardrails on the edges. Have any leaves, snow, or ice cleared regularly. Use sand or salt on walking paths during winter. Clean up any spills in your garage right away. This includes oil or grease spills. What can I do in the bathroom? Use night lights. Install grab bars by the toilet and in the tub and shower. Do not use towel bars as grab bars. Use non-skid mats or decals in the tub or shower. If you need to sit down in the shower, use a plastic, non-slip stool. Keep the floor dry. Clean up any water that spills on the floor as soon as it happens. Remove soap buildup in the tub or shower regularly. Attach bath mats securely with double-sided non-slip rug tape. Do not have throw rugs and other things on the floor that can make you trip. What can I do in the bedroom? Use night lights. Make sure that you have a light by your bed that is easy to reach. Do not use any sheets or blankets that are too big for your bed. They should not  hang down onto the floor. Have a firm chair that has side arms. You can use this for support while you get dressed. Do not have throw rugs and other things on the floor that can make you trip. What can I do in the kitchen? Clean up any spills right away. Avoid walking on wet floors. Keep items that you use a lot in easy-to-reach places. If you need to reach something above you, use a strong step stool that has a grab bar. Keep electrical cords out of the way. Do not use floor polish or wax that makes floors slippery. If you must use wax, use non-skid floor wax. Do not have throw rugs and other things on the floor that can make you trip. What can I do with my stairs? Do not leave any items on the stairs. Make sure that there are handrails on both sides of the stairs and use them. Fix handrails that are broken or loose. Make sure that handrails are as long as the stairways. Check any carpeting to make sure that it is firmly attached to the stairs. Fix any carpet that is loose or worn. Avoid having throw rugs at the top or bottom of the stairs. If you do have throw rugs, attach them to the floor with carpet tape. Make sure that you have  a light switch at the top of the stairs and the bottom of the stairs. If you do not have them, ask someone to add them for you. What else can I do to help prevent falls? Wear shoes that: Do not have high heels. Have rubber bottoms. Are comfortable and fit you well. Are closed at the toe. Do not wear sandals. If you use a stepladder: Make sure that it is fully opened. Do not climb a closed stepladder. Make sure that both sides of the stepladder are locked into place. Ask someone to hold it for you, if possible. Clearly mark and make sure that you can see: Any grab bars or handrails. First and last steps. Where the edge of each step is. Use tools that help you move around (mobility aids) if they are needed. These  include: Canes. Walkers. Scooters. Crutches. Turn on the lights when you go into a dark area. Replace any light bulbs as soon as they burn out. Set up your furniture so you have a clear path. Avoid moving your furniture around. If any of your floors are uneven, fix them. If there are any pets around you, be aware of where they are. Review your medicines with your doctor. Some medicines can make you feel dizzy. This can increase your chance of falling. Ask your doctor what other things that you can do to help prevent falls. This information is not intended to replace advice given to you by your health care provider. Make sure you discuss any questions you have with your health care provider. Document Released: 09/18/2009 Document Revised: 04/29/2016 Document Reviewed: 12/27/2014 Elsevier Interactive Patient Education  2017 [employer].

## 2024-01-04 NOTE — Progress Notes (Signed)
Subjective:   Victoria Bryant is a 78 y.o. female who presents for Medicare Annual (Subsequent) preventive examination.  Visit Complete: Virtual I connected with  [name] on 01/04/24 by a audio enabled telemedicine application and verified that I am speaking with the correct person using two identifiers.  Patient Location: Home  Provider Location: Home Office  I discussed the limitations of evaluation and management by telemedicine. The patient expressed understanding and agreed to proceed.  Vital Signs: Because this visit was a virtual/telehealth visit, some criteria may be missing or patient reported. Any vitals not documented were not able to be obtained and vitals that have been documented are patient reported.    Cardiac Risk Factors include: advanced age (>109men, >66 women);family history of premature cardiovascular disease     Objective:    There were no vitals filed for this visit. There is no height or weight on file to calculate BMI.     01/04/2024   10:54 AM 12/29/2022   10:57 AM 04/14/2022    8:35 AM 12/23/2021    9:36 AM 03/26/2021    8:19 AM 11/26/2020    8:18 AM 07/25/2020    8:19 AM  Advanced Directives  Does Patient Have a Medical Advance Directive? Yes Yes No Yes Yes Yes Yes  Type of Sales promotion account executive of [place];Living will  Healthcare Power of Attorney Living will;Healthcare Power of [employer] Power of [employer];Living will   Does patient want to make changes to medical advance directive?  No - Patient declined   No - Patient declined  No - Patient declined  Copy of Healthcare Power of Attorney in Chart? Yes - validated most recent copy scanned in chart (See row information) No - copy requested  No - copy requested Yes - validated most recent copy scanned in chart (See row information) No - copy requested   Would patient like information on creating a medical advance directive?   No - Patient  declined  No - Patient declined      Current Medications (verified) Outpatient Encounter Medications as of 01/04/2024  Medication Sig   ALPRAZolam (XANAX) 0.5 MG tablet Take 1 tablet (0.5 mg total) by mouth 2 (two) times daily as needed for anxiety.   Ascorbic Acid (VITAMIN C) 1000 MG tablet Take 1,000 mg by mouth daily.   baclofen (LIORESAL) 10 MG tablet Take 0.5 tablets (5 mg total) by mouth 2 (two) times daily.   Cyanocobalamin (VITAMIN B 12 PO) Take by mouth.   fluticasone (FLONASE) 50 MCG/ACT nasal spray Place 1 spray into both nostrils daily.   gabapentin (NEURONTIN) 100 MG capsule Take 1 capsule (100 mg total) by mouth at bedtime. As needed   hyoscyamine (LEVSIN SL) 0.125 MG SL tablet Place 1 tablet (0.125 mg total) under the tongue every 8 (eight) hours as needed.   loteprednol (LOTEMAX) 0.5 % ophthalmic suspension 1 drop 3 (three) times daily.   meloxicam (MOBIC) 15 MG tablet Take 1 tablet (15 mg total) by mouth daily. As needed   Nutritional Supplements (JUICE PLUS FIBRE PO) Take by mouth.   pantoprazole (PROTONIX) 40 MG tablet Take 1 tablet (40 mg total) by mouth daily.   pravastatin (PRAVACHOL) 20 MG tablet Take 1 tablet (20 mg total) by mouth daily.   Probiotic Product (RA PROBIOTIC GUMMIES PO) Take by mouth as needed.   tolterodine (DETROL LA) 4 MG 24 hr capsule Take 1 capsule (4 mg total) by mouth daily as  needed.   Vitamin D, Cholecalciferol, 25 MCG (1000 UT) CAPS Take 2 capsules by mouth daily.   No facility-administered encounter medications on file as of 01/04/2024.    Allergies (verified) Naproxen, Paroxetine, Promethazine, Clarithromycin, Doxycycline hyclate, Erythromycin ethylsuccinate, and Lexapro [escitalopram oxalate]   History: Past Medical History:  Diagnosis Date   Arthritis    COLONIC POLYPS, HX OF 04/21/2007   Combined forms of age-related cataract of left eye 07/19/2019   COVID-19 11/2021   mild case   DEPRESSION 05/26/2009   Diverticulitis     Epiretinal membrane, left eye    GERD 04/08/2008   Glaucoma suspect of both eyes    [facility]   Interstitial cystitis    ? 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/2007   MENOPAUSAL SYNDROME 04/21/2007   NECK PAIN, CHRONIC 04/21/2007   Posterior vitreous detachment of both eyes    [facility]   Pseudophakia of both eyes    [facility]   Regular astigmatism, left eye 07/19/2019   Unspecified disorder of refraction    both eyes   Past Surgical History:  Procedure Laterality Date   COLONOSCOPY  x 2   First time polyps, second time no polyps: recall 10 yrs (next is approx 2024 per pt report)   INCISION AND DRAINAGE ABSCESS ANAL     POLYPECTOMY     TONSILLECTOMY     UPPER GASTROINTESTINAL ENDOSCOPY     VAGINAL HYSTERECTOMY     Nonmalignant reason.  Ovaries still in.   Family History  Problem Relation Age of Onset   Bladder Cancer Mother    Lung cancer Mother    Breast cancer Paternal Aunt    ALS Daughter    Fibromyalgia Sister    Heart disease Brother    Alcohol abuse Brother    Colon cancer Neg Hx    Colon polyps Neg Hx    Esophageal cancer Neg Hx    Rectal cancer Neg Hx    Stomach cancer Neg Hx    Social History   Socioeconomic History   Marital status: Widowed    Spouse name: Not on file   Number of children: Not on file   Years of education: Not on file   Highest education level: GED or equivalent  Occupational History   Occupation: Retired  Tobacco Use   Smoking status: Former    Current packs/day: 0.00    Types: Cigarettes    Quit date: 12/06/1973    Years since quitting: 50.1   Smokeless tobacco: Never  Vaping Use   Vaping status: Never Used  Substance and Sexual Activity   Alcohol use: No   Drug use: No   Sexual activity: Not Currently  Other Topics Concern   Not on file  Social History Narrative   Married, one daughter lives next door to her.   Retired  Acupuncturist.   Tob: 20 pack yr hx, quit age 50.   No alc.   Social Drivers of Corporate investment banker Strain: Low Risk  (01/04/2024)   Overall Financial Resource Strain (CARDIA)    Difficulty of Paying Living Expenses: Not hard at all  Food Insecurity: No Food Insecurity (01/04/2024)   Hunger Vital Sign    Worried About Running Out of Food in the Last Year: Never true    Ran Out of Food in the Last Year: Never true  Transportation Needs: No Transportation Needs (01/04/2024)  PRAPARE - Administrator, Civil Service (Medical): No    Lack of Transportation (Non-Medical): No  Physical Activity: Sufficiently Active (01/04/2024)   Exercise Vital Sign    Days of Exercise per Week: 4 days    Minutes of Exercise per Session: 40 min  Stress: No Stress Concern Present (01/04/2024)   [employer] of Occupational Health - Occupational Stress Questionnaire    Feeling of Stress : Not at all  Social Connections: Moderately Isolated (01/04/2024)   Social Connection and Isolation Panel [NHANES]    Frequency of Communication with Friends and Family: More than three times a week    Frequency of Social Gatherings with Friends and Family: Three times a week    Attends Religious Services: More than 4 times per year    Active Member of Clubs or Organizations: No    Attends Banker Meetings: Never    Marital Status: Widowed    Tobacco Counseling Counseling given: Not Answered   Clinical Intake:  Pre-visit preparation completed: Yes  Pain : No/denies pain     Diabetes: No  How often do you need to have someone help you when you read instructions, pamphlets, or other written materials from your doctor or pharmacy?: 1 - Never  Interpreter Needed?: No  Information entered by :: Remi Haggard LPN   Activities of Daily Living    01/04/2024   10:58 AM  In your present state of health, do you have any difficulty performing the following activities:   Hearing? 0  Vision? 0  Difficulty concentrating or making decisions? 0  Walking or climbing stairs? 0  Dressing or bathing? 0  Doing errands, shopping? 0  Preparing Food and eating ? N  Using the Toilet? N  In the past six months, have you accidently leaked urine? N  Do you have problems with loss of bowel control? N  Managing your Medications? N  Managing your Finances? N    Patient Care Team: Natalia Leatherwood, DO as PCP - General (Family Medicine) Noland Fordyce, MD as Consulting Physician (Obstetrics and Gynecology) Dorena Cookey, MD (Inactive) as Consulting Physician (Gastroenterology) Meryl Dare, MD (Inactive) as Consulting Physician (Gastroenterology) Judi Saa, DO as Consulting Physician (Family Medicine) Nita Sells, MD (Dermatology)  Indicate any recent Medical Services you may have received from other than [facility] providers in the past year (date may be approximate).     Assessment:   This is a routine wellness examination for Sandara.  Hearing/Vision screen Hearing Screening - Comments:: No trouble hearing Vision Screening - Comments:: [facility]  Tiburcio Pea Up to date   Goals Addressed             This Visit's Progress    Patient Stated       travel       Depression Screen    01/04/2024   10:58 AM 10/12/2023    8:21 AM 04/27/2023    8:34 AM 12/29/2022   10:55 AM 11/10/2022    8:03 AM 05/25/2022    8:35 AM 12/23/2021    9:35 AM  PHQ 2/9 Scores  PHQ - 2 Score 0 0 0 0 0 0 0  PHQ- 9 Score 0 0 0 0 0 0     Fall Risk    01/04/2024   10:54 AM 10/12/2023    8:21 AM 04/27/2023    8:34 AM 12/29/2022   10:57 AM 12/12/2022    1:32 PM  Fall Risk   Falls  in the past year? 0 0 0 0 0  Number falls in past yr: 0 0  0 0  Injury with Fall? 0 0 0 0 0  Risk for fall due to :    No Fall Risks   Follow up Falls evaluation completed;Education provided;Falls prevention discussed   Falls evaluation completed     MEDICARE RISK AT HOME: Medicare Risk at  Home Any stairs in or around the home?: Yes If so, are there any without handrails?: No Home free of loose throw rugs in walkways, pet beds, electrical cords, etc?: Yes Adequate lighting in your home to reduce risk of falls?: Yes Life alert?: No Use of a cane, walker or w/c?: No Grab bars in the bathroom?: Yes Shower chair or bench in shower?: No Elevated toilet seat or a handicapped toilet?: No  TIMED UP AND GO:  Was the test performed?  No    Cognitive Function:    10/24/2018   10:14 AM 10/21/2017    8:39 AM  MMSE - Mini Mental State Exam  Orientation to time 5 5  Orientation to Place 5 5  Registration 3 3  Attention/ Calculation 5 5  Recall 2 3  Language- name 2 objects 2 2  Language- repeat 1 1  Language- follow 3 step command 3 3  Language- read & follow direction 1 1  Write a sentence 1 1  Copy design 1 1  Total score 29 30        01/04/2024   10:59 AM 12/29/2022   10:58 AM 12/23/2021    9:39 AM  6CIT Screen  What Year? 0 points 0 points 0 points  What month? 0 points 0 points 0 points  What time? 0 points 0 points 0 points  Count back from 20 0 points 4 points 0 points  Months in reverse 2 points 0 points 0 points  Repeat phrase 2 points 0 points 0 points  Total Score 4 points 4 points 0 points    Immunizations Immunization History  Administered Date(s) Administered   Fluad Quad(high Dose 65+) 08/07/2019, 08/22/2020   Fluad Trivalent(High Dose 65+) 10/12/2023   Influenza Split 09/20/2011, 09/25/2012   Influenza Whole 08/27/2010   Influenza, High Dose Seasonal PF 10/06/2015, 12/26/2017, 09/08/2018   Influenza,inj,Quad PF,6+ Mos 09/26/2013   Influenza-Unspecified 09/17/2014, 09/05/2021, 09/05/2022   Moderna Sars-Covid-2 Vaccination 02/16/2020, 03/15/2020, 10/03/2020   Pneumococcal Conjugate-13 10/06/2015   Pneumococcal Polysaccharide-23 09/20/2011   Td 05/26/2009   Tdap 04/05/2021   Zoster Recombinant(Shingrix) 02/08/2022   Zoster, Live 09/25/2012     TDAP status: Up to date  Flu Vaccine status: Up to date  Pneumococcal vaccine status: Up to date  Covid-19 vaccine status: Information provided on how to obtain vaccines.   Qualifies for Shingles Vaccine? Yes   Zostavax completed No   Shingrix Completed?: No.    Education has been provided regarding the importance of this vaccine. Patient has been advised to call insurance company to determine out of pocket expense if they have not yet received this vaccine. Advised may also receive vaccine at local pharmacy or Health Dept. Verbalized acceptance and understanding.  Screening Tests Health Maintenance  Topic Date Due   DEXA SCAN  11/16/2023   MAMMOGRAM  01/19/2024   Medicare Annual Wellness (AWV)  01/03/2025   DTaP/Tdap/Td (3 - Td or Tdap) 04/06/2031   Pneumonia Vaccine 50+ Years old  Completed   INFLUENZA VACCINE  Completed   Hepatitis C Screening  Completed   HPV VACCINES  Aged Out   COVID-19 Vaccine  Discontinued   Zoster Vaccines- Shingrix  Discontinued    Health Maintenance  Health Maintenance Due  Topic Date Due   DEXA SCAN  11/16/2023    Colorectal cancer screening: No longer required.   Mammogram status: Completed  . Repeat every year  Bone Density status: Ordered  . Pt provided with contact info and advised to call to schedule appt.  Lung Cancer Screening: (Low Dose CT Chest recommended if Age 94-80 years, 20 pack-year currently smoking OR have quit w/in 15years.) does not qualify.   Lung Cancer Screening Referral:   Additional Screening:  Hepatitis C Screening: does not qualify; Completed 2017  Vision Screening: Recommended annual ophthalmology exams for early detection of glaucoma and other disorders of the eye. Is the patient up to date with their annual eye exam?  Yes  Who is the provider or what is the name of the office in which the patient attends annual eye exams? My eye doctor If pt is not established with a provider, would they like to be  referred to a provider to establish care? No .   Dental Screening: Recommended annual dental exams for proper oral hygiene    Community Resource Referral / Chronic Care Management: CRR required this visit?  No   CCM required this visit?  No     Plan:     I have personally reviewed and noted the following in the patient's chart:   Medical and social history Use of alcohol, tobacco or illicit drugs  Current medications and supplements including opioid prescriptions. Patient is not currently taking opioid prescriptions. Functional ability and status Nutritional status Physical activity Advanced directives List of other physicians Hospitalizations, surgeries, and ER visits in previous 12 months Vitals Screenings to include cognitive, depression, and falls Referrals and appointments  In addition, I have reviewed and discussed with patient certain preventive protocols, quality metrics, and best practice recommendations. A written personalized care plan for preventive services as well as general preventive health recommendations were provided to patient.     Remi Haggard, LPN   [id number]   After Visit Summary: (MyChart) Due to this being a telephonic visit, the after visit summary with patients personalized plan was offered to patient via MyChart   Nurse Notes:

## 2024-01-20 DIAGNOSIS — Z1231 Encounter for screening mammogram for malignant neoplasm of breast: Secondary | ICD-10-CM | POA: Diagnosis not present

## 2024-01-20 LAB — HM MAMMOGRAPHY: HM Mammogram: NORMAL (ref 0–4)

## 2024-01-23 LAB — HM MAMMOGRAPHY

## 2024-01-31 ENCOUNTER — Ambulatory Visit (HOSPITAL_BASED_OUTPATIENT_CLINIC_OR_DEPARTMENT_OTHER)
Admission: RE | Admit: 2024-01-31 | Discharge: 2024-01-31 | Disposition: A | Discharge status: Home / Self Care | Payer: Medicare Other | Source: Ambulatory Visit | Attending: Family Medicine | Admitting: Family Medicine

## 2024-01-31 DIAGNOSIS — M85832 Other specified disorders of bone density and structure, left forearm: Secondary | ICD-10-CM | POA: Diagnosis not present

## 2024-01-31 DIAGNOSIS — Z780 Asymptomatic menopausal state: Secondary | ICD-10-CM | POA: Insufficient documentation

## 2024-02-01 ENCOUNTER — Encounter: Payer: Self-pay | Admitting: Family Medicine

## 2024-02-21 DIAGNOSIS — Z85820 Personal history of malignant melanoma of skin: Secondary | ICD-10-CM | POA: Diagnosis not present

## 2024-02-21 DIAGNOSIS — D225 Melanocytic nevi of trunk: Secondary | ICD-10-CM | POA: Diagnosis not present

## 2024-02-21 DIAGNOSIS — Z1283 Encounter for screening for malignant neoplasm of skin: Secondary | ICD-10-CM | POA: Diagnosis not present

## 2024-02-21 DIAGNOSIS — Z08 Encounter for follow-up examination after completed treatment for malignant neoplasm: Secondary | ICD-10-CM | POA: Diagnosis not present

## 2024-02-21 DIAGNOSIS — B078 Other viral warts: Secondary | ICD-10-CM | POA: Diagnosis not present

## 2024-02-27 DIAGNOSIS — N3000 Acute cystitis without hematuria: Secondary | ICD-10-CM | POA: Diagnosis not present

## 2024-02-27 DIAGNOSIS — R300 Dysuria: Secondary | ICD-10-CM | POA: Diagnosis not present

## 2024-02-27 DIAGNOSIS — R350 Frequency of micturition: Secondary | ICD-10-CM | POA: Diagnosis not present

## 2024-03-16 ENCOUNTER — Ambulatory Visit: Payer: Self-pay

## 2024-03-16 NOTE — Telephone Encounter (Signed)
 Copied from CRM [id number]. Topic: Clinical - Red Word Triage >> Mar 16, 2024  9:25 AM Drema Balzarine wrote: Red Word that prompted transfer to Nurse Triage: Patient having neck pain and headaches for 1 month, requested an appointment for next week  Chief Complaint: neck pain Symptoms: pain to sides of neck Frequency: x 1 month Pertinent Negatives: Patient denies cp, sob, numbness, tingling in arms and legs Disposition: [] ED /[] Urgent Care (no appt availability in office) / [x] Appointment(In office/virtual)/ []  [place] [facility]/ [] Home Care/ [] Refused Recommended Disposition /[] [place] [facility]/ []  Follow-up with PCP Additional Notes: per protocol apt made with her pcp per request; care advice given, denies questions; instructed to go to ER if becomes worse.   Reason for Disposition  [1] SEVERE neck pain (e.g., excruciating, unable to do any normal activities) AND [2] not improved after 2 hours of pain medicine  Answer Assessment - Initial Assessment Questions 1. ONSET: "When did the pain begin?"      1 month ago 2. LOCATION: "Where does it hurt?"      Sides of neck and it "gets a fever in it" 3. PATTERN "Does the pain come and go, or has it been constant since it started?"      Constant 4. SEVERITY: "How bad is the pain?"  (Scale 1-10; or mild, moderate, severe)   - NO PAIN (0): no pain or only slight stiffness    - MILD (1-3): doesn't interfere with normal activities    - MODERATE (4-7): interferes with normal activities or awakens from sleep    - SEVERE (8-10):  excruciating pain, unable to do any normal activities      6/10 5. RADIATION: "Does the pain go anywhere else, shoot into your arms?"     denies 6. CORD SYMPTOMS: "Any weakness or numbness of the arms or legs?"     denies 7. CAUSE: "What do you think is causing the neck pain?"     no 8. NECK OVERUSE: "Any recent activities that involved turning or twisting the neck?"     no 9. OTHER SYMPTOMS: "Do you have any other  symptoms?" (e.g., headache, fever, chest pain, difficulty breathing, neck swelling)     Headache,  10. PREGNANCY: "Is there any chance you are pregnant?" "When was your last menstrual period?"       na  Protocols used: Neck Pain or Stiffness-A-AH

## 2024-03-21 ENCOUNTER — Encounter: Payer: Self-pay | Admitting: Family Medicine

## 2024-03-21 ENCOUNTER — Ambulatory Visit (HOSPITAL_BASED_OUTPATIENT_CLINIC_OR_DEPARTMENT_OTHER)
Admission: RE | Admit: 2024-03-21 | Discharge: 2024-03-21 | Disposition: A | Discharge status: Home / Self Care | Source: Ambulatory Visit | Attending: Family Medicine | Admitting: Family Medicine

## 2024-03-21 ENCOUNTER — Ambulatory Visit (INDEPENDENT_AMBULATORY_CARE_PROVIDER_SITE_OTHER): Admitting: Family Medicine

## 2024-03-21 VITALS — BP 138/82 | HR 70 | Temp 98.1°F | Wt 144.4 lb

## 2024-03-21 DIAGNOSIS — M542 Cervicalgia: Secondary | ICD-10-CM

## 2024-03-21 DIAGNOSIS — M47812 Spondylosis without myelopathy or radiculopathy, cervical region: Secondary | ICD-10-CM | POA: Diagnosis not present

## 2024-03-21 DIAGNOSIS — M5080 Other cervical disc disorders, unspecified cervical region: Secondary | ICD-10-CM | POA: Diagnosis not present

## 2024-03-21 DIAGNOSIS — M4802 Spinal stenosis, cervical region: Secondary | ICD-10-CM | POA: Diagnosis not present

## 2024-03-21 DIAGNOSIS — R519 Headache, unspecified: Secondary | ICD-10-CM | POA: Diagnosis not present

## 2024-03-21 MED ORDER — METHYLPREDNISOLONE ACETATE 80 MG/ML IJ SUSP
80.0000 mg | Freq: Once | INTRAMUSCULAR | Status: AC
  Administered 2024-03-21: 80 mg via INTRAMUSCULAR

## 2024-03-21 NOTE — Progress Notes (Signed)
 Victoria Bryant , 1946/06/16, 78 y.o., female MRN: [id number] Patient Care Team    Relationship Specialty Notifications Start End  Natalia Leatherwood, DO PCP - General Family Medicine  06/18/16   Noland Fordyce, MD Consulting Physician Obstetrics and Gynecology  10/06/16   Dorena Cookey, MD (Inactive) Consulting Physician Gastroenterology  10/08/16   Meryl Dare, MD (Inactive) Consulting Physician Gastroenterology  10/24/18   Judi Saa, DO Consulting Physician Family Medicine  10/24/18   Nita Sells, MD  Dermatology  10/24/18     Chief Complaint  Patient presents with   Neck Pain    1 month, L side. Causes frequent headaches. Pt states it is noticed more after waking up in the morning. Pt has taken Tylenol.      Subjective: Victoria Bryant is a 78 y.o. Pt presents for an OV with complaints of neck pain of 4 weeks duration.  Associated symptoms include posterior  left sided neck pain, worse first in the morning.  No imaging to review. She states stretching helps, feels better as she moves. No radiation of pain to extremity. Will cause headache.  He had been prescribed Mobic, gabapentin and baclofen November for her chronic lumbar spine degeneration with sciatica. She has not been taking these routinely.  Patient has a history of chronic neck pain intermittently since 2008 her EMR. Pt has tried tylenol to ease their symptoms.      01/04/2024   10:58 AM 10/12/2023    8:21 AM 04/27/2023    8:34 AM 12/29/2022   10:55 AM 11/10/2022    8:03 AM  Depression screen PHQ 2/9  Decreased Interest 0 0 0 0 0  Down, Depressed, Hopeless 0 0 0 0 0  PHQ - 2 Score 0 0 0 0 0  Altered sleeping 0 0 0 0 0  Tired, decreased energy 0 0 0 0 0  Change in appetite 0 0 0 0 0  Feeling bad or failure about yourself  0 0 0 0 0  Trouble concentrating 0 0 0 0 0  Moving slowly or fidgety/restless 0 0 0 0 0  Suicidal thoughts 0 0 0 0 0  PHQ-9 Score 0 0 0 0 0  Difficult doing work/chores Not  difficult at all Not difficult at all Not difficult at all      Allergies  Allergen Reactions   Naproxen Diarrhea   Paroxetine     REACTION: unspecified   Promethazine     Hyperactive/jittery/insomnia   Clarithromycin Nausea Only   Doxycycline Hyclate Nausea And Vomiting   Erythromycin Ethylsuccinate Nausea Only   Lexapro [Escitalopram Oxalate] Other (See Comments)    Paresthesia    Social History   Social History Narrative   Married, one daughter lives next door to her.   Retired Acupuncturist.   Tob: 20 pack yr hx, quit age 53.   No alc.   Past Medical History:  Diagnosis Date   Arthritis    COLONIC POLYPS, HX OF 04/21/2007   Combined forms of age-related cataract of left eye 07/19/2019   COVID-19 11/2021   mild case   DEPRESSION 05/26/2009   Diverticulitis    Epiretinal membrane, left eye    GERD 04/08/2008   Glaucoma suspect of both eyes    [facility]   Interstitial cystitis    ? 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/2007   MENOPAUSAL SYNDROME 04/21/2007  NECK PAIN, CHRONIC 04/21/2007   Posterior vitreous detachment of both eyes    [facility]   Pseudophakia of both eyes    [facility]   Regular astigmatism, left eye 07/19/2019   Unspecified disorder of refraction    both eyes   Past Surgical History:  Procedure Laterality Date   COLONOSCOPY  x 2   First time polyps, second time no polyps: recall 10 yrs (next is approx 2024 per pt report)   INCISION AND DRAINAGE ABSCESS ANAL     POLYPECTOMY     TONSILLECTOMY     UPPER GASTROINTESTINAL ENDOSCOPY     VAGINAL HYSTERECTOMY     Nonmalignant reason.  Ovaries still in.   Family History  Problem Relation Age of Onset   Bladder Cancer Mother    Lung cancer Mother    Breast cancer Paternal Aunt    ALS Daughter    Fibromyalgia Sister    Heart disease Brother    Alcohol abuse Brother    Colon cancer Neg Hx     Colon polyps Neg Hx    Esophageal cancer Neg Hx    Rectal cancer Neg Hx    Stomach cancer Neg Hx    Allergies as of 03/21/2024       Reactions   Naproxen Diarrhea   Paroxetine    REACTION: unspecified   Promethazine    Hyperactive/jittery/insomnia   Clarithromycin Nausea Only   Doxycycline Hyclate Nausea And Vomiting   Erythromycin Ethylsuccinate Nausea Only   Lexapro [escitalopram Oxalate] Other (See Comments)   Paresthesia        Medication List        Accurate as of March 21, 2024  9:11 AM. If you have any questions, ask your nurse or doctor.          ALPRAZolam 0.5 MG tablet Commonly known as: Xanax Take 1 tablet (0.5 mg total) by mouth 2 (two) times daily as needed for anxiety.   baclofen 10 MG tablet Commonly known as: LIORESAL Take 0.5 tablets (5 mg total) by mouth 2 (two) times daily.   fluticasone 50 MCG/ACT nasal spray Commonly known as: FLONASE Place 1 spray into both nostrils daily.   gabapentin 100 MG capsule Commonly known as: NEURONTIN Take 1 capsule (100 mg total) by mouth at bedtime. As needed   hyoscyamine 0.125 MG SL tablet Commonly known as: LEVSIN SL Place 1 tablet (0.125 mg total) under the tongue every 8 (eight) hours as needed.   JUICE PLUS FIBRE PO Take by mouth.   loteprednol 0.5 % ophthalmic suspension Commonly known as: LOTEMAX 1 drop 3 (three) times daily.   meloxicam 15 MG tablet Commonly known as: MOBIC Take 1 tablet (15 mg total) by mouth daily. As needed   pantoprazole 40 MG tablet Commonly known as: PROTONIX Take 1 tablet (40 mg total) by mouth daily.   pravastatin 20 MG tablet Commonly known as: PRAVACHOL Take 1 tablet (20 mg total) by mouth daily.   RA PROBIOTIC GUMMIES PO Take by mouth as needed.   tolterodine 4 MG 24 hr capsule Commonly known as: DETROL LA Take 1 capsule (4 mg total) by mouth daily as needed.   VITAMIN B 12 PO Take by mouth.   vitamin C 1000 MG tablet Take 1,000 mg by mouth  daily.   Vitamin D (Cholecalciferol) 25 MCG (1000 UT) Caps Take 2 capsules by mouth daily.        All past medical history, surgical history, allergies,  family history, immunizations andmedications were updated in the EMR today and reviewed under the history and medication portions of their EMR.     ROS Negative, with the exception of above mentioned in HPI   Objective:  BP 138/82   Pulse 70   Temp 98.1 F (36.7 C)   Wt 144 lb 6.4 oz (65.5 kg)   SpO2 97%   BMI 27.28 kg/m  Body mass index is 27.28 kg/m. Physical Exam   No results found. No results found. No results found for this or any previous visit (from the past 24 hours).  Assessment/Plan: Victoria Bryant is a 78 y.o. female present for OV for  Neck pain (Primary) Exam consistent with Strain of neck and suspect cervical DDD.  Xray ordered. Pt desired.  IM depo medrol 80 provided today Start mobic and baclofen scheduled Heat application is helpful. PT order placed for OR - DG Cervical Spine Complete; Future - Ambulatory referral to Physical Therapy F/u schedule next month for cmc, will touch base in neck pain then. F/u sooner if pain not improving or worsening.   Reviewed expectations re: course of current medical issues. Discussed self-management of symptoms. Outlined signs and symptoms indicating need for more acute intervention. Patient verbalized understanding and all questions were answered. Patient received an After-Visit Summary.    Orders Placed This Encounter  Procedures   DG Cervical Spine Complete   Ambulatory referral to Physical Therapy   Meds ordered this encounter  Medications   methylPREDNISolone acetate (DEPO-MEDROL) injection 80 mg   Referral Orders         Ambulatory referral to Physical Therapy       Note is dictated utilizing voice recognition software. Although note has been proof read prior to signing, occasional typographical errors still can be missed. If any questions  arise, please do not hesitate to call for verification.   electronically signed by:  Napolean Backbone, DO  [facility] - [place]

## 2024-03-21 NOTE — Patient Instructions (Addendum)
 Return if symptoms worsen or fail to improve.  Please have xray at [facility] today.  Steroid injection provided today Start mobic and baclofen.  Physical therapy will call to get you scheduled     Great to see you today.  I have refilled the medication(s) we provide.   If labs were collected or images ordered, we will inform you of  results once we have received them and reviewed. We will contact you either by echart message, or telephone call.  Please give ample time to the testing facility, and our office to run,  receive and review results. Please do not call inquiring of results, even if you can see them in your chart. We will contact you as soon as we are able. If it has been over 1 week since the test was completed, and you have not yet heard from us , then please call us .    - echart message- for normal results that have been seen by the patient already.   - telephone call: abnormal results or if patient has not viewed results in their echart.  If a referral to a specialist was entered for you, please call us  in 2 weeks if you have not heard from the specialist office to schedule.

## 2024-04-05 ENCOUNTER — Encounter: Payer: Self-pay | Admitting: Family Medicine

## 2024-04-05 ENCOUNTER — Telehealth: Payer: Self-pay | Admitting: Family Medicine

## 2024-04-05 DIAGNOSIS — M47812 Spondylosis without myelopathy or radiculopathy, cervical region: Secondary | ICD-10-CM

## 2024-04-05 NOTE — Telephone Encounter (Signed)
 Reading room called and advised that it has been completed

## 2024-04-05 NOTE — Telephone Encounter (Signed)
 Opened in error

## 2024-04-05 NOTE — Telephone Encounter (Signed)
 Please call radiology and have image read, that was ocmpleted > 2 weeks ago please Thx

## 2024-04-10 DIAGNOSIS — M542 Cervicalgia: Secondary | ICD-10-CM | POA: Diagnosis not present

## 2024-04-18 DIAGNOSIS — M542 Cervicalgia: Secondary | ICD-10-CM | POA: Diagnosis not present

## 2024-04-20 DIAGNOSIS — M542 Cervicalgia: Secondary | ICD-10-CM | POA: Diagnosis not present

## 2024-04-23 DIAGNOSIS — M542 Cervicalgia: Secondary | ICD-10-CM | POA: Diagnosis not present

## 2024-04-25 DIAGNOSIS — M542 Cervicalgia: Secondary | ICD-10-CM | POA: Diagnosis not present

## 2024-04-27 ENCOUNTER — Encounter: Payer: Self-pay | Admitting: Family Medicine

## 2024-04-27 ENCOUNTER — Ambulatory Visit (INDEPENDENT_AMBULATORY_CARE_PROVIDER_SITE_OTHER): Payer: Medicare Other | Admitting: Family Medicine

## 2024-04-27 VITALS — BP 134/74 | HR 74 | Temp 97.9°F | Ht 61.0 in | Wt 139.2 lb

## 2024-04-27 DIAGNOSIS — M5386 Other specified dorsopathies, lumbar region: Secondary | ICD-10-CM

## 2024-04-27 DIAGNOSIS — I700 Atherosclerosis of aorta: Secondary | ICD-10-CM | POA: Diagnosis not present

## 2024-04-27 DIAGNOSIS — R7989 Other specified abnormal findings of blood chemistry: Secondary | ICD-10-CM

## 2024-04-27 DIAGNOSIS — Z0000 Encounter for general adult medical examination without abnormal findings: Secondary | ICD-10-CM

## 2024-04-27 DIAGNOSIS — K580 Irritable bowel syndrome with diarrhea: Secondary | ICD-10-CM | POA: Diagnosis not present

## 2024-04-27 DIAGNOSIS — K219 Gastro-esophageal reflux disease without esophagitis: Secondary | ICD-10-CM

## 2024-04-27 DIAGNOSIS — Z79899 Other long term (current) drug therapy: Secondary | ICD-10-CM

## 2024-04-27 DIAGNOSIS — N3010 Interstitial cystitis (chronic) without hematuria: Secondary | ICD-10-CM

## 2024-04-27 DIAGNOSIS — G479 Sleep disorder, unspecified: Secondary | ICD-10-CM

## 2024-04-27 DIAGNOSIS — M8589 Other specified disorders of bone density and structure, multiple sites: Secondary | ICD-10-CM | POA: Diagnosis not present

## 2024-04-27 DIAGNOSIS — F419 Anxiety disorder, unspecified: Secondary | ICD-10-CM | POA: Diagnosis not present

## 2024-04-27 LAB — COMPREHENSIVE METABOLIC PANEL WITH GFR
ALT: 15 U/L (ref 0–35)
AST: 18 U/L (ref 0–37)
Albumin: 4.8 g/dL (ref 3.5–5.2)
Alkaline Phosphatase: 70 U/L (ref 39–117)
BUN: 16 mg/dL (ref 6–23)
CO2: 32 meq/L (ref 19–32)
Calcium: 9.9 mg/dL (ref 8.4–10.5)
Chloride: 100 meq/L (ref 96–112)
Creatinine, Ser: 0.77 mg/dL (ref 0.40–1.20)
GFR: 73.62 mL/min (ref >60.00)
Glucose, Bld: 91 mg/dL (ref 70–99)
Potassium: 4.7 meq/L (ref 3.5–5.1)
Sodium: 140 meq/L (ref 135–145)
Total Bilirubin: 0.5 mg/dL (ref 0.2–1.2)
Total Protein: 7.3 g/dL (ref 6.0–8.3)

## 2024-04-27 LAB — LIPID PANEL
Cholesterol: 189 mg/dL (ref 0–200)
HDL: 77.7 mg/dL (ref >39.00)
LDL Cholesterol: 87 mg/dL (ref 0–99)
NonHDL: 111.06
Total CHOL/HDL Ratio: 2
Triglycerides: 122 mg/dL (ref 0.0–149.0)
VLDL: 24.4 mg/dL (ref 0.0–40.0)

## 2024-04-27 LAB — CBC
HCT: 40.4 % (ref 36.0–46.0)
Hemoglobin: 13.3 g/dL (ref 12.0–15.0)
MCHC: 33 g/dL (ref 30.0–36.0)
MCV: 90.6 fl (ref 78.0–100.0)
Platelets: 274 10*3/uL (ref 150.0–400.0)
RBC: 4.47 Mil/uL (ref 3.87–5.11)
RDW: 13.6 % (ref 11.5–15.5)
WBC: 8.5 10*3/uL (ref 4.0–10.5)

## 2024-04-27 LAB — MAGNESIUM: Magnesium: 2.2 mg/dL (ref 1.5–2.5)

## 2024-04-27 LAB — B12 AND FOLATE PANEL
Folate: 17.9 ng/mL (ref >5.9)
Vitamin B-12: 1185 pg/mL — ABNORMAL HIGH (ref 211–911)

## 2024-04-27 LAB — HEMOGLOBIN A1C: Hgb A1c MFr Bld: 5.8 % (ref 4.6–6.5)

## 2024-04-27 LAB — TSH: TSH: 4.75 u[IU]/mL (ref 0.35–5.50)

## 2024-04-27 LAB — VITAMIN D 25 HYDROXY (VIT D DEFICIENCY, FRACTURES): VITD: 82.61 ng/mL (ref 30.00–100.00)

## 2024-04-27 MED ORDER — MELOXICAM 15 MG PO TABS: 15.0000 mg | ORAL_TABLET | Freq: Every day | ORAL | 1 refills | Status: DC

## 2024-04-27 MED ORDER — ALPRAZOLAM 0.5 MG PO TABS: 0.5000 mg | ORAL_TABLET | Freq: Two times a day (BID) | ORAL | 1 refills | Status: DC | PRN

## 2024-04-27 MED ORDER — TOLTERODINE TARTRATE ER 4 MG PO CP24: 4.0000 mg | ORAL_CAPSULE | Freq: Every day | ORAL | 1 refills | Status: DC | PRN

## 2024-04-27 MED ORDER — PRAVASTATIN SODIUM 20 MG PO TABS: 20.0000 mg | ORAL_TABLET | Freq: Every day | ORAL | 3 refills | Status: DC

## 2024-04-27 MED ORDER — PANTOPRAZOLE SODIUM 40 MG PO TBEC: 40.0000 mg | DELAYED_RELEASE_TABLET | Freq: Every day | ORAL | 3 refills | Status: DC

## 2024-04-27 MED ORDER — BACLOFEN 10 MG PO TABS: 5.0000 mg | ORAL_TABLET | Freq: Two times a day (BID) | ORAL | 1 refills | Status: DC

## 2024-04-27 MED ORDER — FLUTICASONE PROPIONATE 50 MCG/ACT NA SUSP: 1.0000 | Freq: Every day | NASAL | 3 refills | Status: AC

## 2024-04-27 MED ORDER — GABAPENTIN 100 MG PO CAPS: 100.0000 mg | ORAL_CAPSULE | Freq: Every day | ORAL | 1 refills | Status: DC

## 2024-04-27 MED ORDER — HYOSCYAMINE SULFATE 0.125 MG SL SUBL: 0.1250 mg | SUBLINGUAL_TABLET | Freq: Three times a day (TID) | SUBLINGUAL | 1 refills | Status: DC | PRN

## 2024-04-27 NOTE — Progress Notes (Signed)
 Patient ID: Victoria Bryant, female  DOB: 03/15/1946, 78 y.o.   MRN: [id number] Patient Care Team    Relationship Specialty Notifications Start End  Mariel Shope, DO PCP - General Family Medicine  06/18/16   Audelia Leaks, MD Consulting Physician Obstetrics and Gynecology  10/06/16   Delilah Fend, MD (Inactive) Consulting Physician Gastroenterology  10/08/16   Asencion Blacksmith, MD (Inactive) Consulting Physician Gastroenterology  10/24/18   Isidro Margo, DO Consulting Physician Family Medicine  10/24/18   Denman Fischer, MD  Dermatology  10/24/18     Chief Complaint  Patient presents with   Annual Exam    Chronic Conditions/illness Management.  Pt is fasting.     Subjective: Victoria Bryant is a 78 y.o.  Female  present for CPE and chronic condition management All past medical history, surgical history, allergies, family history, immunizations, medications and social history were updated in the electronic medical record today. All recent labs, ED visits and hospitalizations within the last year were reviewed.  Health maintenance:  Mammogram:2/17//2025> ordered by gyn Dr. Barbar Levine Immunizations: tdap UTD 2022, influ UTD, PNA series completed. Shingrix completed Infectious disease screening: hep c  screen  completed  DEXA: Bone density completed 01/31/2024 (-1.5) repeat in 2-3 years. Assistive device: none Oxygen WUJ:WJXB Patient has a Dental home. Hospitalizations/ED visits: reviewed  anxiety/sleep disturbance: Patient reports she is feeling well.   She uses Xanax  as needed when having increased anxiety or difficulty falling asleep.   She had been prescribed Lexapro  in the past for her depression/anxiety, but has no longer required.   Atherosclerosis of aorta [facility]) Patient reports she has been watching her diet and going to the gym.  She is compliant with pravastatin . She did not desire to take Crestor .   GERD: Condition is well controlled on protonix  daily.  Unable to wean off without recurrent symptoms. She is supplementing b12 and vit d.    Pain emptying bladder/urinary incontinence:  She reports detrol  La is working adequately for her urinary incontinence.     Scoliosis/left sciatica lumbar region degenerative disease:  Reports her pain is still well controlled with Mobic  and baclofen  when needed.     01/04/2024   10:58 AM 10/12/2023    8:21 AM 04/27/2023    8:34 AM 12/29/2022   10:55 AM 11/10/2022    8:03 AM  Depression screen PHQ 2/9  Decreased Interest 0 0 0 0 0  Down, Depressed, Hopeless 0 0 0 0 0  PHQ - 2 Score 0 0 0 0 0  Altered sleeping 0 0 0 0 0  Tired, decreased energy 0 0 0 0 0  Change in appetite 0 0 0 0 0  Feeling bad or failure about yourself  0 0 0 0 0  Trouble concentrating 0 0 0 0 0  Moving slowly or fidgety/restless 0 0 0 0 0  Suicidal thoughts 0 0 0 0 0  PHQ-9 Score 0 0 0 0 0  Difficult doing work/chores Not difficult at all Not difficult at all Not difficult at all        10/12/2023    8:22 AM 04/27/2023    8:34 AM 11/10/2022    8:07 AM 05/25/2022    8:35 AM  GAD 7 : Generalized Anxiety Score  Nervous, Anxious, on Edge 0 0 0 0  Control/stop worrying 0 0 1 0  Worry too much - different things 0 0 0 0  Trouble relaxing 0 0 0 0  Restless 0 0 0 0  Easily annoyed or irritable 0 0 0 0  Afraid - awful might happen 0 0 0 0  Total GAD 7 Score 0 0 1 0  Anxiety Difficulty Not difficult at all Not difficult at all      Immunization History  Administered Date(s) Administered   Fluad Quad(high Dose 65+) 08/07/2019, 08/22/2020   Fluad Trivalent(High Dose 65+) 10/12/2023   Influenza Split 09/20/2011, 09/25/2012   Influenza Whole 08/27/2010   Influenza, High Dose Seasonal PF 10/06/2015, 12/26/2017, 09/08/2018   Influenza,inj,Quad PF,6+ Mos 09/26/2013   Influenza-Unspecified 09/17/2014, 09/05/2021, 09/05/2022   Moderna Sars-Covid-2 Vaccination 02/16/2020, 03/15/2020, 10/03/2020   Pneumococcal Conjugate-13 10/06/2015    Pneumococcal Polysaccharide-23 09/20/2011   Td 05/26/2009   Tdap 01/05/2021, 04/05/2021   Zoster Recombinant(Shingrix) 02/08/2022   Zoster, Live 09/25/2012     Past Medical History:  Diagnosis Date   Arthritis    COLONIC POLYPS, HX OF 04/21/2007   Combined forms of age-related cataract of left eye 07/19/2019   COVID-19 11/2021   mild case   DEPRESSION 05/26/2009   Diverticulitis    Epiretinal membrane, left eye    GERD 04/08/2008   Glaucoma suspect of both eyes    [facility]   Interstitial cystitis    ? 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/2007   MENOPAUSAL SYNDROME 04/21/2007   NECK PAIN, CHRONIC 04/21/2007   Posterior vitreous detachment of both eyes    [facility]   Pseudophakia of both eyes    [facility]   Regular astigmatism, left eye 07/19/2019   Unspecified disorder of refraction    both eyes   Allergies  Allergen Reactions   Naproxen  Diarrhea   Paroxetine     REACTION: unspecified   Promethazine     Hyperactive/jittery/insomnia   Clarithromycin Nausea Only   Doxycycline Hyclate Nausea And Vomiting   Erythromycin Ethylsuccinate Nausea Only   Lexapro  [Escitalopram  Oxalate] Other (See Comments)    Paresthesia    Past Surgical History:  Procedure Laterality Date   COLONOSCOPY  x 2   First time polyps, second time no polyps: recall 10 yrs (next is approx 2024 per pt report)   INCISION AND DRAINAGE ABSCESS ANAL     POLYPECTOMY     TONSILLECTOMY     UPPER GASTROINTESTINAL ENDOSCOPY     VAGINAL HYSTERECTOMY     Nonmalignant reason.  Ovaries still in.   Family History  Problem Relation Age of Onset   Bladder Cancer Mother    Lung cancer Mother    Breast cancer Paternal Aunt    ALS Daughter    Fibromyalgia Sister    Heart disease Brother    Alcohol abuse Brother    Colon cancer Neg Hx    Colon polyps Neg Hx    Esophageal cancer Neg Hx    Rectal cancer Neg Hx     Stomach cancer Neg Hx    Social History   Social History Narrative   Married, one daughter lives next door to her.   Retired Acupuncturist.   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   No alc.    Allergies as of 04/27/2024       Reactions   Naproxen  Diarrhea   Paroxetine    REACTION: unspecified   Promethazine    Hyperactive/jittery/insomnia   Clarithromycin Nausea Only   Doxycycline Hyclate Nausea And Vomiting   Erythromycin Ethylsuccinate Nausea  Only   Lexapro  [escitalopram  Oxalate] Other (See Comments)   Paresthesia        Medication List        Accurate as of Apr 27, 2024  9:00 AM. If you have any questions, ask your nurse or doctor.          ALPRAZolam  0.5 MG tablet Commonly known as: Xanax  Take 1 tablet (0.5 mg total) by mouth 2 (two) times daily as needed for anxiety.   baclofen  10 MG tablet Commonly known as: LIORESAL  Take 0.5 tablets (5 mg total) by mouth 2 (two) times daily.   fluticasone  50 MCG/ACT nasal spray Commonly known as: FLONASE  Place 1 spray into both nostrils daily.   gabapentin  100 MG capsule Commonly known as: NEURONTIN  Take 1 capsule (100 mg total) by mouth at bedtime. As needed   hyoscyamine  0.125 MG SL tablet Commonly known as: LEVSIN SL Place 1 tablet (0.125 mg total) under the tongue every 8 (eight) hours as needed.   JUICE PLUS FIBRE PO Take by mouth.   loteprednol 0.5 % ophthalmic suspension Commonly known as: LOTEMAX 1 drop 3 (three) times daily.   meloxicam  15 MG tablet Commonly known as: MOBIC  Take 1 tablet (15 mg total) by mouth daily. As needed   pantoprazole  40 MG tablet Commonly known as: PROTONIX  Take 1 tablet (40 mg total) by mouth daily.   pravastatin  20 MG tablet Commonly known as: PRAVACHOL  Take 1 tablet (20 mg total) by mouth daily.   RA PROBIOTIC GUMMIES PO Take by mouth as needed.   tolterodine  4 MG 24 hr capsule Commonly known as: DETROL  LA Take 1 capsule (4 mg total) by mouth daily as  needed.   VITAMIN B 12 PO Take by mouth.   vitamin C 1000 MG tablet Take 1,000 mg by mouth daily.   Vitamin D  (Cholecalciferol) 25 MCG (1000 UT) Caps Take 2 capsules by mouth daily.        All past medical history, surgical history, allergies, family history, immunizations andmedications were updated in the EMR today and reviewed under the history and medication portions of their EMR.     No results found for this or any previous visit (from the past 2160 hours).  No results found.   ROS 14 pt review of systems performed and negative (unless mentioned in an HPI)  Objective: BP 134/74   Pulse 74   Temp 97.9 F (36.6 C)   Ht 5\' 1"  (1.549 m)   Wt 139 lb 3.2 oz (63.1 kg)   SpO2 97%   BMI 26.30 kg/m  Physical Exam Vitals and nursing note reviewed.  Constitutional:      General: She is not in acute distress.    Appearance: Normal appearance. She is not ill-appearing or toxic-appearing.  HENT:     Head: Normocephalic and atraumatic.     Right Ear: Tympanic membrane, ear canal and external ear normal. There is no impacted cerumen.     Left Ear: Tympanic membrane, ear canal and external ear normal. There is no impacted cerumen.     Nose: No congestion or rhinorrhea.     Mouth/Throat:     Mouth: Mucous membranes are moist.     Pharynx: Oropharynx is clear. No oropharyngeal exudate or posterior oropharyngeal erythema.  Eyes:     General: No scleral icterus.       Right eye: No discharge.        Left eye: No discharge.     Extraocular Movements: Extraocular movements  intact.     Conjunctiva/sclera: Conjunctivae normal.     Pupils: Pupils are equal, round, and reactive to light.  Cardiovascular:     Rate and Rhythm: Normal rate and regular rhythm.     Pulses: Normal pulses.     Heart sounds: Normal heart sounds. No murmur heard.    No friction rub. No gallop.  Pulmonary:     Effort: Pulmonary effort is normal. No respiratory distress.     Breath sounds: Normal breath  sounds. No stridor. No wheezing, rhonchi or rales.  Chest:     Chest wall: No tenderness.  Abdominal:     General: Abdomen is flat. Bowel sounds are normal. There is no distension.     Palpations: Abdomen is soft. There is no mass.     Tenderness: There is no abdominal tenderness. There is no right CVA tenderness, left CVA tenderness, guarding or rebound.     Hernia: No hernia is present.  Musculoskeletal:        General: No swelling, tenderness or deformity. Normal range of motion.     Cervical back: Normal range of motion and neck supple. No rigidity or tenderness.     Right lower leg: No edema.     Left lower leg: No edema.  Lymphadenopathy:     Cervical: No cervical adenopathy.  Skin:    General: Skin is warm and dry.     Coloration: Skin is not jaundiced or pale.     Findings: No bruising, erythema, lesion or rash.  Neurological:     General: No focal deficit present.     Mental Status: She is alert and oriented to person, place, and time. Mental status is at baseline.     Cranial Nerves: No cranial nerve deficit.     Sensory: No sensory deficit.     Motor: No weakness.     Coordination: Coordination normal.     Gait: Gait normal.     Deep Tendon Reflexes: Reflexes normal.  Psychiatric:        Mood and Affect: Mood normal.        Behavior: Behavior normal.        Thought Content: Thought content normal.        Judgment: Judgment normal.    No results found.  Assessment/plan: Victoria Bryant is a 78 y.o. female present for CPE and chronic conditions management pain emptying bladder/urinary incontinence:  Stable Continue Detrol  LA.    Scoliosis, left sciatica lumbar region degenerative disease:  Stable Continue baclofen  1/2 tab BID PRN Continue  Mobic  daily.   Sleep disturbance/anxiety Stable Continue Xanax  nightly as needed, when having difficulty sleeping secondary to anxiety. Gerty  controlled substance database reviewed 04/27/24  Leg  cramps Stable Continue gabapentin  prn (rarely uses)  GERD long-term current use of proton pump inhibitor therapy Stable Continue Protonix  40 mg daily Vitamin D , B12 and magnesium collected today  Lipid screening/Atherosclerosis of aorta (HCC) Continue pravastatin . CMP, TSH, CBC, Lipid , A!C collected   Long-term current use of proton pump inhibitor therapy - B12 and Folate Panel - Vitamin D  (25 hydroxy) - Magnesium  Elevated TSH - TSH irritable bowel syndrome with diarrhea Continue bentyl prn  Osteopenia of multiple sites DEXA- UTD 2025 (-1.5) - Vitamin D  (25 hydroxy)  Routine general medical examination at a health care facility (Primary) - CBC - Comprehensive metabolic panel with GFR - TSH Patient was encouraged to exercise greater than 150 minutes a week. Patient was encouraged to choose a  diet filled with fresh fruits and vegetables, and lean meats. AVS provided to patient today for education/recommendation on gender specific health and safety maintenance. Mammogram:2/17//2025> ordered by gyn Dr. Barbar Levine Immunizations: tdap UTD 2022, influ UTD, PNA series completed. Shingrix completed Infectious disease screening: hep c  screen  completed  DEXA: Bone density completed 01/31/2024 (-1.5) repeat in 2-3 years.  Return in about 6 months (around 10/15/2024) for Routine chronic condition follow-up.  Orders Placed This Encounter  Procedures   CBC   Comprehensive metabolic panel with GFR   Hemoglobin A1c   Lipid panel   TSH   B12 and Folate Panel   Vitamin D  (25 hydroxy)   Magnesium   Meds ordered this encounter  Medications   ALPRAZolam  (XANAX ) 0.5 MG tablet    Sig: Take 1 tablet (0.5 mg total) by mouth 2 (two) times daily as needed for anxiety.    Dispense:  180 tablet    Refill:  1   baclofen  (LIORESAL ) 10 MG tablet    Sig: Take 0.5 tablets (5 mg total) by mouth 2 (two) times daily.    Dispense:  45 each    Refill:  1   gabapentin  (NEURONTIN ) 100 MG capsule     Sig: Take 1 capsule (100 mg total) by mouth at bedtime. As needed    Dispense:  90 capsule    Refill:  1   hyoscyamine  (LEVSIN SL) 0.125 MG SL tablet    Sig: Place 1 tablet (0.125 mg total) under the tongue every 8 (eight) hours as needed.    Dispense:  90 tablet    Refill:  1   meloxicam  (MOBIC ) 15 MG tablet    Sig: Take 1 tablet (15 mg total) by mouth daily. As needed    Dispense:  90 tablet    Refill:  1   tolterodine  (DETROL  LA) 4 MG 24 hr capsule    Sig: Take 1 capsule (4 mg total) by mouth daily as needed.    Dispense:  90 capsule    Refill:  1   pravastatin  (PRAVACHOL ) 20 MG tablet    Sig: Take 1 tablet (20 mg total) by mouth daily.    Dispense:  90 tablet    Refill:  3   pantoprazole  (PROTONIX ) 40 MG tablet    Sig: Take 1 tablet (40 mg total) by mouth daily.    Dispense:  90 tablet    Refill:  3   fluticasone  (FLONASE ) 50 MCG/ACT nasal spray    Sig: Place 1 spray into both nostrils daily.    Dispense:  48 g    Refill:  3   Referral Orders  No referral(s) requested today     Electronically signed by: Napolean Backbone, DO [facility] [place]

## 2024-04-27 NOTE — Patient Instructions (Addendum)
 Return in about 6 months (around 10/15/2024) for Routine chronic condition follow-up.        Great to see you today.  I have refilled the medication(s) we provide.   If labs were collected or images ordered, we will inform you of  results once we have received them and reviewed. We will contact you either by echart message, or telephone call.  Please give ample time to the testing facility, and our office to run,  receive and review results. Please do not call inquiring of results, even if you can see them in your chart. We will contact you as soon as we are able. If it has been over 1 week since the test was completed, and you have not yet heard from us , then please call us .    - echart message- for normal results that have been seen by the patient already.   - telephone call: abnormal results or if patient has not viewed results in their echart.  If a referral to a specialist was entered for you, please call us  in 2 weeks if you have not heard from the specialist office to schedule.

## 2024-05-01 ENCOUNTER — Ambulatory Visit: Payer: Self-pay | Admitting: Family Medicine

## 2024-05-01 DIAGNOSIS — M542 Cervicalgia: Secondary | ICD-10-CM | POA: Diagnosis not present

## 2024-05-08 DIAGNOSIS — M542 Cervicalgia: Secondary | ICD-10-CM | POA: Diagnosis not present

## 2024-05-10 DIAGNOSIS — M542 Cervicalgia: Secondary | ICD-10-CM | POA: Diagnosis not present

## 2024-05-15 DIAGNOSIS — M542 Cervicalgia: Secondary | ICD-10-CM | POA: Diagnosis not present

## 2024-05-17 DIAGNOSIS — M542 Cervicalgia: Secondary | ICD-10-CM | POA: Diagnosis not present

## 2024-05-18 DIAGNOSIS — M1711 Unilateral primary osteoarthritis, right knee: Secondary | ICD-10-CM | POA: Diagnosis not present

## 2024-05-22 DIAGNOSIS — M542 Cervicalgia: Secondary | ICD-10-CM | POA: Diagnosis not present

## 2024-05-24 ENCOUNTER — Ambulatory Visit: Payer: Self-pay | Admitting: *Deleted

## 2024-05-24 DIAGNOSIS — R300 Dysuria: Secondary | ICD-10-CM | POA: Diagnosis not present

## 2024-05-24 DIAGNOSIS — W57XXXA Bitten or stung by nonvenomous insect and other nonvenomous arthropods, initial encounter: Secondary | ICD-10-CM | POA: Diagnosis not present

## 2024-05-24 DIAGNOSIS — M542 Cervicalgia: Secondary | ICD-10-CM | POA: Diagnosis not present

## 2024-05-24 NOTE — Telephone Encounter (Signed)
 No further action needed at this time.

## 2024-05-24 NOTE — Telephone Encounter (Signed)
 Copied from CRM [id number]. Topic: Clinical - Red Word Triage >> May 24, 2024  8:12 AM Howard Macho wrote: Red Word that prompted transfer to Nurse Triage: patient called stating she was bit by a tick and she does know how long it had been on her back. The patient took it off last night and it was  a red ring on her back and now the patent is stating the ring is bigger Reason for Disposition  [1] Red streak or red line AND [2] length > 2 inches (5 cm)  Answer Assessment - Initial Assessment Questions 1. ATTACHED:  Is the tick still on the skin?  (e.g., yes, no, unsure)     No    I thought it was a mole.    I've been scratching it.   I got my family to look at it.   I don't know how long it was in me.   There is a hole there.  There is a red ring around it.   Today the red ring is bigger. 2. ONSET - TICK STILL ATTACHED:  How long do you think the tick has been on your skin? (e.g., hours, days, unsure)  Note:  Is there a recent activity (camping, hiking) where the caller may have been exposed?     I don't know  3. ONSET - TICK NOT STILL ATTACHED: If the tick has been removed, how long do you think the tick was attached before you removed it? (e.g., 5 hours, 2 days). When was this?     I don't know 4. LOCATION: Where is the tick bite located? (e.g., arm, leg)     In the middle of my back between my shoulder blades. 5. TYPE of TICK: Is it a wood tick or a deer tick? (e.g., deer tick, wood tick; unsure)     It was a regular deer tick. 6. SIZE of TICK: How big is the tick? (e.g., size of poppy seed, apple seed, watermelon seed; unsure) Note: Deer ticks can be the size of a poppy seed (nymph) or an apple seed (adult).       Regular deer tick.    She had a hard time getting the tick off of me. 7. ENGORGED: Did the tick look flat or engorged (full, swollen)? (e.g., flat, engorged; unsure)     It was flat and regular sized tick 8. OTHER SYMPTOMS: Do you have any other symptoms? (e.g., fever,  rash, redness at bite area, red ring around bite)     Red ring around the site and it's itching a lot.   No pain.   There is a hole in the middle of it. 9. PREGNANCY: Is there any chance you are pregnant? When was your last menstrual period?     N/A  Protocols used: Tick Bite-A-AH FYI Only or Action Required?:  Pt asking if she can be seen today if possible by any provider.  High priority message sent to practice.  No openings with any provider until 05/25/2024.   Action required by provider: request for appointment.  Patient was last seen in primary care on 04/27/2024 by Napolean Backbone A, DO. Called Nurse Triage reporting Insect Bite. Symptoms began several days ago. Interventions attempted: Nothing. Symptoms are: rapidly worsening Has a red ring around the bite that is growing.  Pt is very concerned.  Triage Disposition: See HCP Within 4 Hours (Or PCP Triage)  Patient/caregiver understands and will follow disposition?:  Yes

## 2024-05-24 NOTE — Telephone Encounter (Signed)
 I called into the office and spoke with Dana to see if pt could be worked in today.   Dr. Marylee Snowball only working half a day and the other provider is booked.   No openings today with anyone.   Suggest pt go to urgent care.  I called pt and let her know there was no way we could get her in today and to go on to the urgent care.   Pt. Was agreeable to doing this and thanked me for calling her back.

## 2024-06-20 DIAGNOSIS — M1711 Unilateral primary osteoarthritis, right knee: Secondary | ICD-10-CM | POA: Diagnosis not present

## 2024-06-27 DIAGNOSIS — M1711 Unilateral primary osteoarthritis, right knee: Secondary | ICD-10-CM | POA: Diagnosis not present

## 2024-07-04 DIAGNOSIS — M1711 Unilateral primary osteoarthritis, right knee: Secondary | ICD-10-CM | POA: Diagnosis not present

## 2024-07-06 DIAGNOSIS — H6993 Unspecified Eustachian tube disorder, bilateral: Secondary | ICD-10-CM | POA: Diagnosis not present

## 2024-07-06 DIAGNOSIS — H9313 Tinnitus, bilateral: Secondary | ICD-10-CM | POA: Diagnosis not present

## 2024-07-13 DIAGNOSIS — H6993 Unspecified Eustachian tube disorder, bilateral: Secondary | ICD-10-CM | POA: Diagnosis not present

## 2024-08-01 ENCOUNTER — Encounter: Payer: Self-pay | Admitting: Family Medicine

## 2024-08-01 DIAGNOSIS — H9209 Otalgia, unspecified ear: Secondary | ICD-10-CM | POA: Diagnosis not present

## 2024-08-01 DIAGNOSIS — H903 Sensorineural hearing loss, bilateral: Secondary | ICD-10-CM | POA: Diagnosis not present

## 2024-08-01 DIAGNOSIS — H6990 Unspecified Eustachian tube disorder, unspecified ear: Secondary | ICD-10-CM | POA: Diagnosis not present

## 2024-08-01 NOTE — Telephone Encounter (Signed)
 Please call patient and get her scheduled for an appointment for evaluation and discussion of her symptoms. We would need to evaluate her for her concerns.

## 2024-08-08 ENCOUNTER — Ambulatory Visit: Admitting: Family Medicine

## 2024-08-09 ENCOUNTER — Ambulatory Visit (INDEPENDENT_AMBULATORY_CARE_PROVIDER_SITE_OTHER): Admitting: Family Medicine

## 2024-08-09 ENCOUNTER — Encounter: Payer: Self-pay | Admitting: Family Medicine

## 2024-08-09 ENCOUNTER — Ambulatory Visit (HOSPITAL_BASED_OUTPATIENT_CLINIC_OR_DEPARTMENT_OTHER)
Admission: RE | Admit: 2024-08-09 | Discharge: 2024-08-09 | Disposition: A | Discharge status: Home / Self Care | Source: Ambulatory Visit | Attending: Family Medicine | Admitting: Family Medicine

## 2024-08-09 VITALS — BP 130/80 | HR 80 | Temp 97.8°F | Wt 141.0 lb

## 2024-08-09 DIAGNOSIS — B9689 Other specified bacterial agents as the cause of diseases classified elsewhere: Secondary | ICD-10-CM | POA: Diagnosis not present

## 2024-08-09 DIAGNOSIS — H6993 Unspecified Eustachian tube disorder, bilateral: Secondary | ICD-10-CM | POA: Diagnosis not present

## 2024-08-09 DIAGNOSIS — J3489 Other specified disorders of nose and nasal sinuses: Secondary | ICD-10-CM | POA: Insufficient documentation

## 2024-08-09 DIAGNOSIS — J329 Chronic sinusitis, unspecified: Secondary | ICD-10-CM

## 2024-08-09 MED ORDER — CETIRIZINE HCL 10 MG PO TABS: 10.0000 mg | ORAL_TABLET | Freq: Every day | ORAL | 11 refills | Status: AC

## 2024-08-09 MED ORDER — AMOXICILLIN-POT CLAVULANATE 875-125 MG PO TABS
1.0000 | ORAL_TABLET | Freq: Two times a day (BID) | ORAL | 0 refills | Status: DC
Start: 2024-08-09 — End: 2024-09-04

## 2024-08-09 MED ORDER — PREDNISONE 20 MG PO TABS
ORAL_TABLET | ORAL | 0 refills | Status: DC
Start: 2024-08-09 — End: 2024-09-04

## 2024-08-09 NOTE — Patient Instructions (Signed)

## 2024-08-09 NOTE — Progress Notes (Signed)
 SHAHED Bryant , [birth year], 78 y.o., female MRN: [id number] Patient Care Team    Relationship Specialty Notifications Start End  Catherine Charlies LABOR, DO PCP - General Family Medicine  06/18/16   Kandyce Sor, MD Consulting Physician Obstetrics and Gynecology  10/06/16   Dyane Rush, MD (Inactive) Consulting Physician Gastroenterology  10/08/16   Aneita Gwendlyn DASEN, MD (Inactive) Consulting Physician Gastroenterology  10/24/18   Claudene Arthea CHRISTELLA, DO Consulting Physician Family Medicine  10/24/18   Shona Rush, MD  Dermatology  10/24/18     Chief Complaint  Patient presents with   Ear Fullness    Over a month, R ear; has been seen in UC previously and was prescribed prednisone . Pt was seen by audiologist Dr. Otelia on 8/29; Pt failed hearing exam. Pt still not seeing any improvement.      Subjective: Victoria Bryant is a 78 y.o. Pt presents for an OV with complaints of bilateral ear fullness and sinus pain of greater than 1 month.  Duration.  Requesting neurology referral for her ears. Associated symptoms include right sided maxillary sinus pain, congestion and rhinorrhea.  Patient reports she has had rather significant symptoms over the last 4 weeks without any relief.  She was seen at a urgent care and treated with a Prednisone  burst and a Z-Pak in which she received no relief in symptoms.  She was recently evaluated by an audiologist and reports she believes she failed the audiology exam.  She states at first she did well and could hear, and then she lost concentration during the exam and became tearful. She reports she has been using her Flonase  daily, and has used Claritin on a couple occasions.  She states her sinus and nasal passages are making copious amounts of phlegm. She denies fevers or chills     01/04/2024   10:58 AM 10/12/2023    8:21 AM 04/27/2023    8:34 AM 12/29/2022   10:55 AM 11/10/2022    8:03 AM  Depression screen PHQ 2/9  Decreased Interest 0 0 0 0 0  Down,  Depressed, Hopeless 0 0 0 0 0  PHQ - 2 Score 0 0 0 0 0  Altered sleeping 0 0 0 0 0  Tired, decreased energy 0 0 0 0 0  Change in appetite 0 0 0 0 0  Feeling bad or failure about yourself  0 0 0 0 0  Trouble concentrating 0 0 0 0 0  Moving slowly or fidgety/restless 0 0 0 0 0  Suicidal thoughts 0 0 0 0 0  PHQ-9 Score 0 0 0 0 0  Difficult doing work/chores Not difficult at all Not difficult at all Not difficult at all      Allergies  Allergen Reactions   Naproxen  Diarrhea   Paroxetine     REACTION: unspecified   Promethazine     Hyperactive/jittery/insomnia   Clarithromycin Nausea Only   Doxycycline Hyclate Nausea And Vomiting   Erythromycin Ethylsuccinate Nausea Only   Lexapro  [Escitalopram  Oxalate] Other (See Comments)    Paresthesia    Social History   Social History Narrative   Married, one daughter lives next door to her.   Retired Acupuncturist.   Tob: 20 pack yr hx, quit age 47.   No alc.   Past Medical History:  Diagnosis Date   Arthritis    COLONIC POLYPS, HX OF 04/21/2007   Combined forms of age-related cataract of left eye 07/19/2019  COVID-19 11/2021   mild case   DEPRESSION 05/26/2009   Diverticulitis    Epiretinal membrane, left eye    GERD 04/08/2008   Glaucoma suspect of both eyes    [facility]   Interstitial cystitis    ? 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   Pseudophakia of both eyes    [facility]   Regular astigmatism, left eye 07/19/2019   Unspecified disorder of refraction    both eyes   Past Surgical History:  Procedure Laterality Date   COLONOSCOPY  x 2   First time polyps, second time no polyps: recall 10 yrs (next is approx 2024 per pt report)   INCISION AND DRAINAGE ABSCESS ANAL     POLYPECTOMY      TONSILLECTOMY     UPPER GASTROINTESTINAL ENDOSCOPY     VAGINAL HYSTERECTOMY     Nonmalignant reason.  Ovaries still in.   Family History  Problem Relation Age of Onset   Bladder Cancer Mother    Lung cancer Mother    Breast cancer Paternal Aunt    ALS Daughter    Fibromyalgia Sister    Heart disease Brother    Alcohol abuse Brother    Colon cancer Neg Hx    Colon polyps Neg Hx    Esophageal cancer Neg Hx    Rectal cancer Neg Hx    Stomach cancer Neg Hx    Allergies as of 08/09/2024       Reactions   Naproxen  Diarrhea   Paroxetine    REACTION: unspecified   Promethazine    Hyperactive/jittery/insomnia   Clarithromycin Nausea Only   Doxycycline Hyclate Nausea And Vomiting   Erythromycin Ethylsuccinate Nausea Only   Lexapro  [escitalopram  Oxalate] Other (See Comments)   Paresthesia        Medication List        Accurate as of August 09, 2024 11:27 AM. If you have any questions, ask your nurse or doctor.          ALPRAZolam  0.5 MG tablet Commonly known as: Xanax  Take 1 tablet (0.5 mg total) by mouth 2 (two) times daily as needed for anxiety.   amoxicillin -clavulanate 875-125 MG tablet Commonly known as: AUGMENTIN  Take 1 tablet by mouth 2 (two) times daily. Started by: Charlies Bellini   baclofen  10 MG tablet Commonly known as: LIORESAL  Take 0.5 tablets (5 mg total) by mouth 2 (two) times daily.   cetirizine  10 MG tablet Commonly known as: ZYRTEC  Take 1 tablet (10 mg total) by mouth at bedtime. Started by: Kaevion Sinclair   fluticasone  50 MCG/ACT nasal spray Commonly known as: FLONASE  Place 1 spray into both nostrils daily.   gabapentin  100 MG capsule Commonly known as: NEURONTIN  Take 1 capsule (100 mg total) by mouth at bedtime. As needed   hyoscyamine  0.125 MG SL tablet Commonly known as: LEVSIN  SL Place 1 tablet (0.125 mg total) under the tongue every 8 (eight) hours as needed.   JUICE PLUS FIBRE PO Take by mouth.   loteprednol 0.5 % ophthalmic  suspension Commonly known as: LOTEMAX 1 drop 3 (three) times daily.   meloxicam  15 MG tablet Commonly known as: MOBIC  Take 1 tablet (15 mg total) by mouth daily. As needed   pantoprazole  40 MG tablet Commonly known as: PROTONIX   Take 1 tablet (40 mg total) by mouth daily.   pravastatin  20 MG tablet Commonly known as: PRAVACHOL  Take 1 tablet (20 mg total) by mouth daily.   predniSONE  20 MG tablet Commonly known as: DELTASONE  60 mg x3d, 40 mg x3d, 20 mg x2d, 10 mg x2d Started by: Charlies Bellini   RA PROBIOTIC GUMMIES PO Take by mouth as needed.   tolterodine  4 MG 24 hr capsule Commonly known as: DETROL  LA Take 1 capsule (4 mg total) by mouth daily as needed.   VITAMIN B 12 PO Take by mouth.   vitamin C 1000 MG tablet Take 1,000 mg by mouth daily.   Vitamin D  (Cholecalciferol) 25 MCG (1000 UT) Caps Take 2 capsules by mouth daily.        All past medical history, surgical history, allergies, family history, immunizations andmedications were updated in the EMR today and reviewed under the history and medication portions of their EMR.     ROS Negative, with the exception of above mentioned in HPI   Objective:  BP 130/80   Pulse 80   Temp 97.8 F (36.6 C)   Wt 141 lb (64 kg)   SpO2 98%   BMI 26.64 kg/m  Body mass index is 26.64 kg/m.  Physical Exam Vitals and nursing note reviewed.  Constitutional:      General: She is not in acute distress.    Appearance: Normal appearance. She is not ill-appearing, toxic-appearing or diaphoretic.  HENT:     Head: Normocephalic and atraumatic.     Right Ear: Ear canal and external ear normal. No tenderness. A middle ear effusion is present. There is no impacted cerumen. No mastoid tenderness. Tympanic membrane is not erythematous or bulging.     Left Ear: Ear canal and external ear normal. No tenderness. A middle ear effusion is present. There is no impacted cerumen. No mastoid tenderness. Tympanic membrane is not  erythematous or bulging.     Nose: Congestion and rhinorrhea present.     Right Turbinates: Swollen.     Left Turbinates: Swollen.     Right Sinus: Maxillary sinus tenderness present.     Mouth/Throat:     Mouth: Mucous membranes are moist.     Pharynx: No oropharyngeal exudate or posterior oropharyngeal erythema.  Eyes:     General: No scleral icterus.       Right eye: No discharge.        Left eye: No discharge.     Extraocular Movements: Extraocular movements intact.     Conjunctiva/sclera: Conjunctivae normal.     Pupils: Pupils are equal, round, and reactive to light.  Cardiovascular:     Rate and Rhythm: Normal rate and regular rhythm.     Heart sounds: No murmur heard. Pulmonary:     Effort: Pulmonary effort is normal. No respiratory distress.     Breath sounds: Normal breath sounds. No wheezing, rhonchi or rales.  Musculoskeletal:     Cervical back: Neck supple.     Right lower leg: No edema.     Left lower leg: No edema.  Lymphadenopathy:     Cervical: Cervical adenopathy present.  Skin:    General: Skin is warm.     Findings: No rash.  Neurological:     Mental Status: She is alert and oriented to person, place, and time. Mental status is at baseline.     Motor: No weakness.     Gait: Gait normal.  Psychiatric:  Mood and Affect: Mood normal.        Behavior: Behavior normal.        Thought Content: Thought content normal.        Judgment: Judgment normal.      No results found. No results found. No results found for this or any previous visit (from the past 24 hours).  Assessment/Plan: Victoria Bryant is a 78 y.o. female present for OV for  Bacterial sinusitis (Primary)/Sinus pain/eustachian tube dysfunction Patient is able to hear conversation normally without obvious decreased hearing today.  She does have bilateral ear effusions suggesting allergies and sinusitis.  She needs additional coverage and treatment for bacterial sinusitis and  allergies. Rest.  Hydrate. Start Zyrtec  10 mg nightly-take for at least 4 weeks Continue Flonase  nasal spray daily Start Augmentin  twice daily x 10 days Start prednisone  taper x 10 days - DG Sinuses Complete; Future Follow-up in 3-4 weeks, sooner if needed   Reviewed expectations re: course of current medical issues. Discussed self-management of symptoms. Outlined signs and symptoms indicating need for more acute intervention. Patient verbalized understanding and all questions were answered. Patient received an After-Visit Summary.    Orders Placed This Encounter  Procedures   DG Sinuses Complete   Meds ordered this encounter  Medications   amoxicillin -clavulanate (AUGMENTIN ) 875-125 MG tablet    Sig: Take 1 tablet by mouth 2 (two) times daily.    Dispense:  20 tablet    Refill:  0   cetirizine  (ZYRTEC ) 10 MG tablet    Sig: Take 1 tablet (10 mg total) by mouth at bedtime.    Dispense:  30 tablet    Refill:  11   predniSONE  (DELTASONE ) 20 MG tablet    Sig: 60 mg x3d, 40 mg x3d, 20 mg x2d, 10 mg x2d    Dispense:  18 tablet    Refill:  0   Referral Orders  No referral(s) requested today     Note is dictated utilizing voice recognition software. Although note has been proof read prior to signing, occasional typographical errors still can be missed. If any questions arise, please do not hesitate to call for verification.   electronically signed by:  Charlies Bellini, DO  [facility] [place]

## 2024-08-10 ENCOUNTER — Ambulatory Visit: Payer: Self-pay | Admitting: Family Medicine

## 2024-09-04 ENCOUNTER — Encounter: Payer: Self-pay | Admitting: Family Medicine

## 2024-09-04 ENCOUNTER — Ambulatory Visit: Admitting: Family Medicine

## 2024-09-04 VITALS — BP 130/80 | HR 82 | Temp 98.1°F | Wt 140.4 lb

## 2024-09-04 DIAGNOSIS — R052 Subacute cough: Secondary | ICD-10-CM | POA: Diagnosis not present

## 2024-09-04 DIAGNOSIS — J4521 Mild intermittent asthma with (acute) exacerbation: Secondary | ICD-10-CM | POA: Diagnosis not present

## 2024-09-04 DIAGNOSIS — J301 Allergic rhinitis due to pollen: Secondary | ICD-10-CM | POA: Diagnosis not present

## 2024-09-04 DIAGNOSIS — J45909 Unspecified asthma, uncomplicated: Secondary | ICD-10-CM | POA: Insufficient documentation

## 2024-09-04 HISTORY — DX: Allergic rhinitis due to pollen: J30.1

## 2024-09-04 MED ORDER — IPRATROPIUM BROMIDE 0.06 % NA SOLN: 2.0000 | Freq: Four times a day (QID) | NASAL | 12 refills | Status: AC

## 2024-09-04 MED ORDER — MONTELUKAST SODIUM 10 MG PO TABS: 10.0000 mg | ORAL_TABLET | Freq: Every day | ORAL | 11 refills | Status: AC

## 2024-09-04 MED ORDER — BUDESONIDE-FORMOTEROL FUMARATE 80-4.5 MCG/ACT IN AERO: 2.0000 | INHALATION_SPRAY | Freq: Every day | RESPIRATORY_TRACT | 2 refills | Status: DC

## 2024-09-04 NOTE — Patient Instructions (Addendum)

## 2024-09-04 NOTE — Progress Notes (Signed)
 Victoria Bryant , 1946-05-14, 78 y.o., female MRN: [id number] Patient Care Team    Relationship Specialty Notifications Start End  Catherine Charlies LABOR, DO PCP - General Family Medicine  06/18/16   Kandyce Sor, MD Consulting Physician Obstetrics and Gynecology  10/06/16   Dyane Rush, MD (Inactive) Consulting Physician Gastroenterology  10/08/16   Aneita Gwendlyn DASEN, MD (Inactive) Consulting Physician Gastroenterology  10/24/18   Claudene Arthea CHRISTELLA, DO Consulting Physician Family Medicine  10/24/18   Shona Rush, MD  Dermatology  10/24/18     Chief Complaint  Patient presents with   Cough    Ongoing cough for 2 weeks; cough, sinus/ear pressure. Pt has taken Robitussin.       Subjective: Victoria Bryant is a 78 y.o. Pt presents for an OV with complaints of cough of 2 weeks duration.  Associated symptoms include fatigue and generally not feeling well Patient was seen August 09, 2024 with similar symptoms treated with Augmentin  and prednisone .  Patient takes Zyrtec  nightly and Flonase  daily  Patient's symptoms rebounded after she completed the prednisone  taper. (See ROS)    01/04/2024   10:58 AM 10/12/2023    8:21 AM 04/27/2023    8:34 AM 12/29/2022   10:55 AM 11/10/2022    8:03 AM  Depression screen PHQ 2/9  Decreased Interest 0 0 0 0 0  Down, Depressed, Hopeless 0 0 0 0 0  PHQ - 2 Score 0 0 0 0 0  Altered sleeping 0 0 0 0 0  Tired, decreased energy 0 0 0 0 0  Change in appetite 0 0 0 0 0  Feeling bad or failure about yourself  0 0 0 0 0  Trouble concentrating 0 0 0 0 0  Moving slowly or fidgety/restless 0 0 0 0 0  Suicidal thoughts 0 0 0 0 0  PHQ-9 Score 0 0 0 0 0  Difficult doing work/chores Not difficult at all Not difficult at all Not difficult at all      Allergies  Allergen Reactions   Naproxen  Diarrhea   Paroxetine     REACTION: unspecified   Promethazine     Hyperactive/jittery/insomnia   Clarithromycin Nausea Only   Doxycycline Hyclate Nausea And  Vomiting   Erythromycin Ethylsuccinate Nausea Only   Lexapro  [Escitalopram  Oxalate] Other (See Comments)    Paresthesia    Social History   Social History Narrative   Married, one daughter lives next door to her.   Retired Acupuncturist.   Tob: 20 pack yr hx, quit age 40.   No alc.   Past Medical History:  Diagnosis Date   Arthritis    COLONIC POLYPS, HX OF 04/21/2007   Combined forms of age-related cataract of left eye 07/19/2019   COVID-19 11/2021   mild case   DEPRESSION 05/26/2009   Diverticulitis    Epiretinal membrane, left eye    GERD 04/08/2008   Glaucoma suspect of both eyes    [facility]   Interstitial cystitis    ? 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/2007   MENOPAUSAL SYNDROME 04/21/2007   NECK PAIN, CHRONIC 04/21/2007   Posterior vitreous detachment of both eyes    [facility]   Pseudophakia of both eyes    [facility]   Regular astigmatism, left eye 07/19/2019   Unspecified disorder of refraction    both eyes   Past Surgical History:  Procedure Laterality Date   COLONOSCOPY  x 2   First time polyps, second time no polyps: recall 10 yrs (next is approx 2024 per pt report)   INCISION AND DRAINAGE ABSCESS ANAL     POLYPECTOMY     TONSILLECTOMY     UPPER GASTROINTESTINAL ENDOSCOPY     VAGINAL HYSTERECTOMY     Nonmalignant reason.  Ovaries still in.   Family History  Problem Relation Age of Onset   Bladder Cancer Mother    Lung cancer Mother    Breast cancer Paternal Aunt    ALS Daughter    Fibromyalgia Sister    Heart disease Brother    Alcohol abuse Brother    Colon cancer Neg Hx    Colon polyps Neg Hx    Esophageal cancer Neg Hx    Rectal cancer Neg Hx    Stomach cancer Neg Hx    Allergies as of 09/04/2024       Reactions   Naproxen  Diarrhea   Paroxetine    REACTION: unspecified   Promethazine    Hyperactive/jittery/insomnia    Clarithromycin Nausea Only   Doxycycline Hyclate Nausea And Vomiting   Erythromycin Ethylsuccinate Nausea Only   Lexapro  [escitalopram  Oxalate] Other (See Comments)   Paresthesia        Medication List        Accurate as of September 04, 2024  8:38 AM. If you have any questions, ask your nurse or doctor.          STOP taking these medications    amoxicillin -clavulanate 875-125 MG tablet Commonly known as: AUGMENTIN  Stopped by: Charlies Bellini   predniSONE  20 MG tablet Commonly known as: DELTASONE  Stopped by: Charlies Bellini       TAKE these medications    ALPRAZolam  0.5 MG tablet Commonly known as: Xanax  Take 1 tablet (0.5 mg total) by mouth 2 (two) times daily as needed for anxiety.   baclofen  10 MG tablet Commonly known as: LIORESAL  Take 0.5 tablets (5 mg total) by mouth 2 (two) times daily.   budesonide-formoterol 80-4.5 MCG/ACT inhaler Commonly known as: Symbicort Inhale 2 puffs into the lungs daily. Started by: Charlies Bellini   cetirizine  10 MG tablet Commonly known as: ZYRTEC  Take 1 tablet (10 mg total) by mouth at bedtime.   fluticasone  50 MCG/ACT nasal spray Commonly known as: FLONASE  Place 1 spray into both nostrils daily.   gabapentin  100 MG capsule Commonly known as: NEURONTIN  Take 1 capsule (100 mg total) by mouth at bedtime. As needed   hyoscyamine  0.125 MG SL tablet Commonly known as: LEVSIN  SL Place 1 tablet (0.125 mg total) under the tongue every 8 (eight) hours as needed.   ipratropium 0.06 % nasal spray Commonly known as: ATROVENT Place 2 sprays into both nostrils 4 (four) times daily. Started by: Charlies Bellini   JUICE PLUS FIBRE PO Take by mouth.   loteprednol 0.5 % ophthalmic suspension Commonly known as: LOTEMAX 1 drop 3 (three) times daily.   meloxicam  15 MG tablet Commonly known as: MOBIC  Take 1 tablet (15 mg total) by mouth daily. As needed   montelukast 10 MG tablet Commonly known as: SINGULAIR Take 1 tablet (10 mg  total) by mouth at bedtime. Started by: Charlies Bellini   pantoprazole  40 MG tablet Commonly known as: PROTONIX  Take 1 tablet (40 mg total) by mouth daily.   pravastatin  20 MG tablet Commonly known as: PRAVACHOL  Take 1 tablet (20 mg total) by mouth daily.   RA PROBIOTIC GUMMIES PO  Take by mouth as needed.   tolterodine  4 MG 24 hr capsule Commonly known as: DETROL  LA Take 1 capsule (4 mg total) by mouth daily as needed.   VITAMIN B 12 PO Take by mouth.   vitamin C 1000 MG tablet Take 1,000 mg by mouth daily.   Vitamin D  (Cholecalciferol) 25 MCG (1000 UT) Caps Take 2 capsules by mouth daily.        All past medical history, surgical history, allergies, family history, immunizations andmedications were updated in the EMR today and reviewed under the history and medication portions of their EMR.     Review of Systems  Constitutional:  Positive for malaise/fatigue. Negative for chills and fever.  HENT:  Positive for congestion and ear pain. Negative for sinus pain and sore throat.   Eyes: Negative.   Respiratory:  Positive for cough and wheezing.   Gastrointestinal: Negative.   Genitourinary: Negative.   Musculoskeletal: Negative.   Neurological:  Negative for dizziness and headaches.  All other systems reviewed and are negative.  Negative, with the exception of above mentioned in HPI   Objective:  BP 130/80   Pulse 82   Temp 98.1 F (36.7 C)   Wt 140 lb 6.4 oz (63.7 kg)   SpO2 97%   BMI 26.53 kg/m  Body mass index is 26.53 kg/m. Physical Exam Vitals and nursing note reviewed.  Constitutional:      General: She is not in acute distress.    Appearance: Normal appearance. She is normal weight. She is not ill-appearing or toxic-appearing.  HENT:     Head: Normocephalic and atraumatic.     Right Ear: Tympanic membrane, ear canal and external ear normal.     Left Ear: Tympanic membrane, ear canal and external ear normal.     Nose: Rhinorrhea present. No  congestion.     Mouth/Throat:     Mouth: Mucous membranes are moist.     Pharynx: No oropharyngeal exudate or posterior oropharyngeal erythema.     Comments: Postnasal drip present Eyes:     General: No scleral icterus.       Right eye: No discharge.        Left eye: No discharge.     Extraocular Movements: Extraocular movements intact.     Conjunctiva/sclera: Conjunctivae normal.     Pupils: Pupils are equal, round, and reactive to light.  Cardiovascular:     Rate and Rhythm: Normal rate and regular rhythm.     Heart sounds: No murmur heard. Pulmonary:     Effort: Pulmonary effort is normal. No respiratory distress.     Breath sounds: Normal breath sounds. No wheezing, rhonchi or rales.  Musculoskeletal:     Cervical back: Neck supple.  Lymphadenopathy:     Cervical: No cervical adenopathy.  Skin:    Findings: No rash.  Neurological:     Mental Status: She is alert and oriented to person, place, and time. Mental status is at baseline.     Motor: No weakness.     Coordination: Coordination normal.     Gait: Gait normal.  Psychiatric:        Mood and Affect: Mood normal.        Behavior: Behavior normal.        Thought Content: Thought content normal.        Judgment: Judgment normal.      No results found. No results found. No results found for this or any previous visit (from the past 24  hours).  Assessment/Plan: Victoria Bryant is a 78 y.o. female present for OV for  Subacute cough (Primary)/Seasonal allergic rhinitis due to pollen/Hayfever Suspect her symptoms are allergy and reactive airway related secondary to hayfever season.  Symptoms rebounded after finishing prednisone  taper. Hydrate Continue Xyzal nightly Continue nasal saline flushes followed by Flonase  nasal spray twice daily Start Singulair nightly Start Atrovent nasal spray 4 times daily, with 1 dose being nightly  Mild intermittent reactive airway disease with acute exacerbation Start Symbicort 2  puffs daily for the next 4 weeks then as needed  Reviewed expectations re: course of current medical issues. Discussed self-management of symptoms. Outlined signs and symptoms indicating need for more acute intervention. Patient verbalized understanding and all questions were answered. Patient received an After-Visit Summary.    No orders of the defined types were placed in this encounter.  Meds ordered this encounter  Medications   montelukast (SINGULAIR) 10 MG tablet    Sig: Take 1 tablet (10 mg total) by mouth at bedtime.    Dispense:  30 tablet    Refill:  11   ipratropium (ATROVENT) 0.06 % nasal spray    Sig: Place 2 sprays into both nostrils 4 (four) times daily.    Dispense:  15 mL    Refill:  12   budesonide-formoterol (SYMBICORT) 80-4.5 MCG/ACT inhaler    Sig: Inhale 2 puffs into the lungs daily.    Dispense:  1 each    Refill:  2   Referral Orders  No referral(s) requested today     Note is dictated utilizing voice recognition software. Although note has been proof read prior to signing, occasional typographical errors still can be missed. If any questions arise, please do not hesitate to call for verification.   electronically signed by:  Charlies Bellini, DO  [facility] [place]

## 2024-09-20 ENCOUNTER — Emergency Department (HOSPITAL_BASED_OUTPATIENT_CLINIC_OR_DEPARTMENT_OTHER)

## 2024-09-20 ENCOUNTER — Other Ambulatory Visit: Payer: Self-pay

## 2024-09-20 ENCOUNTER — Emergency Department (HOSPITAL_BASED_OUTPATIENT_CLINIC_OR_DEPARTMENT_OTHER)
Admission: EM | Admit: 2024-09-20 | Discharge: 2024-09-20 | Disposition: A | Discharge status: Home / Self Care | Attending: Emergency Medicine | Admitting: Emergency Medicine

## 2024-09-20 ENCOUNTER — Encounter (HOSPITAL_BASED_OUTPATIENT_CLINIC_OR_DEPARTMENT_OTHER): Payer: Self-pay

## 2024-09-20 ENCOUNTER — Other Ambulatory Visit (HOSPITAL_BASED_OUTPATIENT_CLINIC_OR_DEPARTMENT_OTHER): Payer: Self-pay

## 2024-09-20 DIAGNOSIS — R109 Unspecified abdominal pain: Secondary | ICD-10-CM | POA: Diagnosis not present

## 2024-09-20 DIAGNOSIS — D72829 Elevated white blood cell count, unspecified: Secondary | ICD-10-CM | POA: Diagnosis not present

## 2024-09-20 DIAGNOSIS — K5730 Diverticulosis of large intestine without perforation or abscess without bleeding: Secondary | ICD-10-CM | POA: Diagnosis not present

## 2024-09-20 DIAGNOSIS — N3091 Cystitis, unspecified with hematuria: Secondary | ICD-10-CM | POA: Diagnosis not present

## 2024-09-20 DIAGNOSIS — Z85820 Personal history of malignant melanoma of skin: Secondary | ICD-10-CM | POA: Insufficient documentation

## 2024-09-20 DIAGNOSIS — D1803 Hemangioma of intra-abdominal structures: Secondary | ICD-10-CM | POA: Diagnosis not present

## 2024-09-20 DIAGNOSIS — R300 Dysuria: Secondary | ICD-10-CM | POA: Diagnosis present

## 2024-09-20 DIAGNOSIS — N3090 Cystitis, unspecified without hematuria: Secondary | ICD-10-CM | POA: Diagnosis not present

## 2024-09-20 LAB — CBC WITH DIFFERENTIAL/PLATELET
Abs Immature Granulocytes: 0.06 K/uL (ref 0.00–0.07)
Basophils Absolute: 0.1 K/uL (ref 0.0–0.1)
Basophils Relative: 1 %
Eosinophils Absolute: 0.4 K/uL (ref 0.0–0.5)
Eosinophils Relative: 2 %
HCT: 39.4 % (ref 36.0–46.0)
Hemoglobin: 13.3 g/dL (ref 12.0–15.0)
Immature Granulocytes: 0 %
Lymphocytes Relative: 33 %
Lymphs Abs: 4.9 K/uL — ABNORMAL HIGH (ref 0.7–4.0)
MCH: 30.6 pg (ref 26.0–34.0)
MCHC: 33.8 g/dL (ref 30.0–36.0)
MCV: 90.8 fL (ref 80.0–100.0)
Monocytes Absolute: 1.4 K/uL — ABNORMAL HIGH (ref 0.1–1.0)
Monocytes Relative: 9 %
Neutro Abs: 8.1 K/uL — ABNORMAL HIGH (ref 1.7–7.7)
Neutrophils Relative %: 55 %
Platelets: 232 K/uL (ref 150–400)
RBC: 4.34 MIL/uL (ref 3.87–5.11)
RDW: 12.4 % (ref 11.5–15.5)
Smear Review: NORMAL
WBC: 14.4 K/uL — ABNORMAL HIGH (ref 4.0–10.5)
nRBC: 0 % (ref 0.0–0.2)

## 2024-09-20 LAB — URINALYSIS, ROUTINE W REFLEX MICROSCOPIC

## 2024-09-20 LAB — URINALYSIS, MICROSCOPIC (REFLEX): RBC / HPF: >50 RBC/hpf (ref 0–5)

## 2024-09-20 LAB — BASIC METABOLIC PANEL WITH GFR
Anion gap: 11 (ref 5–15)
BUN: 11 mg/dL (ref 8–23)
CO2: 26 mmol/L (ref 22–32)
Calcium: 9.5 mg/dL (ref 8.9–10.3)
Chloride: 103 mmol/L (ref 98–111)
Creatinine, Ser: 0.77 mg/dL (ref 0.44–1.00)
GFR, Estimated: >60 mL/min (ref >60)
Glucose, Bld: 91 mg/dL (ref 70–99)
Potassium: 3.8 mmol/L (ref 3.5–5.1)
Sodium: 139 mmol/L (ref 135–145)

## 2024-09-20 MED ORDER — PHENAZOPYRIDINE HCL 200 MG PO TABS
200.0000 mg | ORAL_TABLET | Freq: Three times a day (TID) | ORAL | 0 refills | Status: DC
  Filled 2024-09-20: qty 6, 2d supply, fill #0

## 2024-09-20 MED ORDER — SODIUM CHLORIDE 0.9 % IV BOLUS
1000.0000 mL | Freq: Once | INTRAVENOUS | Status: AC
  Administered 2024-09-20: 1000 mL via INTRAVENOUS

## 2024-09-20 MED ORDER — PHENAZOPYRIDINE HCL 100 MG PO TABS
200.0000 mg | ORAL_TABLET | Freq: Once | ORAL | Status: AC
  Administered 2024-09-20: 200 mg via ORAL
  Filled 2024-09-20: qty 2

## 2024-09-20 MED ORDER — SODIUM CHLORIDE 0.9 % IV SOLN
1.0000 g | Freq: Once | INTRAVENOUS | Status: AC
  Administered 2024-09-20: 1 g via INTRAVENOUS
  Filled 2024-09-20: qty 10

## 2024-09-20 MED ORDER — KETOROLAC TROMETHAMINE 30 MG/ML IJ SOLN
30.0000 mg | Freq: Once | INTRAMUSCULAR | Status: AC
  Administered 2024-09-20: 30 mg via INTRAVENOUS
  Filled 2024-09-20: qty 1

## 2024-09-20 MED ORDER — CEPHALEXIN 500 MG PO CAPS
500.0000 mg | ORAL_CAPSULE | Freq: Three times a day (TID) | ORAL | 0 refills | Status: DC
  Filled 2024-09-20: qty 21, 7d supply, fill #0

## 2024-09-20 NOTE — ED Notes (Signed)
 Back from CT

## 2024-09-20 NOTE — ED Triage Notes (Signed)
 Pt reports burning with urination that began this am. Passing blood in urine possibly clots. States thinks she passed a kidney stone , but has no hx. Hx of UTI. Burns with urination

## 2024-09-20 NOTE — ED Notes (Signed)
 Pt getting antiobiotic

## 2024-09-20 NOTE — ED Provider Notes (Signed)
 [place] [facility] Provider Note   CSN: [id number] Arrival date & time: 09/20/24  1008     Patient presents with: Hematuria   Pat Victoria Bryant is a 78 y.o. female.   Pt is a 78 yo female with pmhx significant for depression, gerd, melanoma, arthritis, and glaucoma.  Pt presents to the ED today with hematuria and dysuria.  She had similar sx last week, took some leftover cipro  for a few days and sx went away.  They came back today.  She is really worried she has a kidney stone.  No back pain.  No fever.  No blood thinners.       Prior to Admission medications   Medication Sig Start Date End Date Taking? Authorizing Provider  cephALEXin  (KEFLEX ) 500 MG capsule Take 1 capsule (500 mg total) by mouth 3 (three) times daily. 09/20/24  Yes Dean Clarity, MD  phenazopyridine (PYRIDIUM) 200 MG tablet Take 1 tablet (200 mg total) by mouth 3 (three) times daily. 09/20/24  Yes Dean Clarity, MD  ALPRAZolam  (XANAX ) 0.5 MG tablet Take 1 tablet (0.5 mg total) by mouth 2 (two) times daily as needed for anxiety. 04/27/24   Kuneff, Renee A, DO  Ascorbic Acid (VITAMIN C) 1000 MG tablet Take 1,000 mg by mouth daily.    [provider]  baclofen  (LIORESAL ) 10 MG tablet Take 0.5 tablets (5 mg total) by mouth 2 (two) times daily. 04/27/24   Kuneff, Renee A, DO  budesonide-formoterol (SYMBICORT) 80-4.5 MCG/ACT inhaler Inhale 2 puffs into the lungs daily. 09/04/24   Kuneff, Renee A, DO  cetirizine  (ZYRTEC ) 10 MG tablet Take 1 tablet (10 mg total) by mouth at bedtime. 08/09/24   Kuneff, Renee A, DO  Cyanocobalamin  (VITAMIN B 12 PO) Take by mouth.    [provider]  fluticasone  (FLONASE ) 50 MCG/ACT nasal spray Place 1 spray into both nostrils daily. 04/27/24   Kuneff, Renee A, DO  gabapentin  (NEURONTIN ) 100 MG capsule Take 1 capsule (100 mg total) by mouth at bedtime. As needed 04/27/24   Kuneff, Renee A, DO  hyoscyamine  (LEVSIN  SL) 0.125 MG SL tablet Place 1  tablet (0.125 mg total) under the tongue every 8 (eight) hours as needed. 04/27/24   Kuneff, Renee A, DO  ipratropium (ATROVENT) 0.06 % nasal spray Place 2 sprays into both nostrils 4 (four) times daily. 09/04/24   Kuneff, Renee A, DO  loteprednol (LOTEMAX) 0.5 % ophthalmic suspension 1 drop 3 (three) times daily. 07/29/23   [provider]  meloxicam  (MOBIC ) 15 MG tablet Take 1 tablet (15 mg total) by mouth daily. As needed 04/27/24   Kuneff, Renee A, DO  montelukast (SINGULAIR) 10 MG tablet Take 1 tablet (10 mg total) by mouth at bedtime. 09/04/24   Kuneff, Renee A, DO  Nutritional Supplements (JUICE PLUS FIBRE PO) Take by mouth.    [provider]  pantoprazole  (PROTONIX ) 40 MG tablet Take 1 tablet (40 mg total) by mouth daily. 04/27/24   Kuneff, Renee A, DO  pravastatin  (PRAVACHOL ) 20 MG tablet Take 1 tablet (20 mg total) by mouth daily. 04/27/24   Kuneff, Renee A, DO  Probiotic Product (RA PROBIOTIC GUMMIES PO) Take by mouth as needed.    [provider]  tolterodine  (DETROL  LA) 4 MG 24 hr capsule Take 1 capsule (4 mg total) by mouth daily as needed. 04/27/24   Kuneff, Renee A, DO  Vitamin D , Cholecalciferol, 25 MCG (1000 UT) CAPS Take 2 capsules by mouth  daily.    [provider]    Allergies: Naproxen , Paroxetine, Promethazine, Clarithromycin, Doxycycline hyclate, Erythromycin ethylsuccinate, and Lexapro  [escitalopram  oxalate]    Review of Systems  Genitourinary:  Positive for dysuria and hematuria.  All other systems reviewed and are negative.   Updated Vital Signs BP (!) 149/66   Pulse 86   Temp 97.8 F (36.6 C) (Oral)   Resp 16   Wt 63.5 kg   SpO2 93%   BMI 26.45 kg/m   Physical Exam Vitals and nursing note reviewed.  Constitutional:      Appearance: Normal appearance.  HENT:     Head: Normocephalic and atraumatic.     Right Ear: External ear normal.     Left Ear: External ear normal.     Nose: Nose normal.     Mouth/Throat:     Mouth:  Mucous membranes are moist.     Pharynx: Oropharynx is clear.  Eyes:     Extraocular Movements: Extraocular movements intact.     Conjunctiva/sclera: Conjunctivae normal.     Pupils: Pupils are equal, round, and reactive to light.  Cardiovascular:     Rate and Rhythm: Normal rate and regular rhythm.     Pulses: Normal pulses.     Heart sounds: Normal heart sounds.  Pulmonary:     Effort: Pulmonary effort is normal.     Breath sounds: Normal breath sounds.  Abdominal:     General: Abdomen is flat. Bowel sounds are normal.     Palpations: Abdomen is soft.  Musculoskeletal:        General: Normal range of motion.     Cervical back: Normal range of motion and neck supple.  Skin:    General: Skin is warm.     Capillary Refill: Capillary refill takes less than 2 seconds.  Neurological:     General: No focal deficit present.     Mental Status: She is alert and oriented to person, place, and time.  Psychiatric:        Mood and Affect: Mood normal.        Behavior: Behavior normal.     (all labs ordered are listed, but only abnormal results are displayed) Labs Reviewed  URINALYSIS, ROUTINE W REFLEX MICROSCOPIC - Abnormal; Notable for the following components:      Result Value   Color, Urine RED (*)    APPearance TURBID (*)    Glucose, UA   (*)    Value: TEST NOT REPORTED DUE TO COLOR INTERFERENCE OF URINE PIGMENT   Hgb urine dipstick   (*)    Value: TEST NOT REPORTED DUE TO COLOR INTERFERENCE OF URINE PIGMENT   Bilirubin Urine   (*)    Value: TEST NOT REPORTED DUE TO COLOR INTERFERENCE OF URINE PIGMENT   Ketones, ur   (*)    Value: TEST NOT REPORTED DUE TO COLOR INTERFERENCE OF URINE PIGMENT   Protein, ur   (*)    Value: TEST NOT REPORTED DUE TO COLOR INTERFERENCE OF URINE PIGMENT   Nitrite   (*)    Value: TEST NOT REPORTED DUE TO COLOR INTERFERENCE OF URINE PIGMENT   Leukocytes,Ua   (*)    Value: TEST NOT REPORTED DUE TO COLOR INTERFERENCE OF URINE PIGMENT   All other  components within normal limits  CBC WITH DIFFERENTIAL/PLATELET - Abnormal; Notable for the following components:   WBC 14.4 (*)    Neutro Abs 8.1 (*)    Lymphs Abs 4.9 (*)  Monocytes Absolute 1.4 (*)    All other components within normal limits  URINALYSIS, MICROSCOPIC (REFLEX) - Abnormal; Notable for the following components:   Bacteria, UA FEW (*)    All other components within normal limits  URINE CULTURE  BASIC METABOLIC PANEL WITH GFR    EKG: None  Radiology: CT Renal Stone Study Result Date: 09/20/2024 EXAM: CT UROGRAM 09/20/2024 11:41:00 AM TECHNIQUE: CT of the abdomen and pelvis was performed without intravenous contrast as per CT urogram protocol. Multiplanar reformatted images as well as MIP urogram images are provided for review. Automated exposure control, iterative reconstruction, and/or weight based adjustment of the mA/kV was utilized to reduce the radiation dose to as low as reasonably achievable. COMPARISON: 07/25/2020 and 08/09/2020. CLINICAL HISTORY: Abdominal/flank pain, stone suspected. Pt reports burning with urination that began this am. Passing blood in urine possibly clots. States thinks she passed a kidney stone, but has no hx. Hx of UTI. Burns with urination. FINDINGS: LOWER CHEST: No acute abnormality. LIVER: Hepatic steatosis. Hemangioma noted on prior exams is not well visualized on this unenhanced exam. GALLBLADDER AND BILE DUCTS: Gallbladder is unremarkable. No biliary ductal dilatation. SPLEEN: No acute abnormality. PANCREAS: No acute abnormality. ADRENAL GLANDS: No acute abnormality. KIDNEYS, URETERS AND BLADDER: No stones in the kidneys or ureters. No hydronephrosis. No perinephric or periureteral stranding. Urinary bladder is unremarkable. GI AND BOWEL: Stomach demonstrates no acute abnormality. Sigmoid diverticulosis without inflammation. There is no bowel obstruction. PERITONEUM AND RETROPERITONEUM: No ascites. No free air. VASCULATURE: Aorta is normal in  caliber. Aortic atherosclerosis. LYMPH NODES: No lymphadenopathy. REPRODUCTIVE ORGANS: Status post hysterectomy. BONES AND SOFT TISSUES: Severe degenerative disc disease is noted at L1-L2 and L2-L3. No acute osseous abnormality. No focal soft tissue abnormality. IMPRESSION: 1. No nephrolithiasis, obstructive uropathy, or urothelial mass. 2. Sigmoid diverticulosis without inflammation. 3. Aortic atherosclerosis. 4. Hepatic steatosis. 5. Severe degenerative disc disease at L1-2 and L2-3. Electronically signed by: Lynwood Seip MD 09/20/2024 12:10 PM EDT RP Workstation: [id number]     Procedures   Medications Ordered in the ED  cefTRIAXone  (ROCEPHIN ) 1 g in sodium chloride  0.9 % 100 mL IVPB (has no administration in time range)  sodium chloride  0.9 % bolus 1,000 mL (1,000 mLs Intravenous New Bag/Given 09/20/24 1126)  phenazopyridine (PYRIDIUM) tablet 200 mg (200 mg Oral Given 09/20/24 1158)  ketorolac (TORADOL) 30 MG/ML injection 30 mg (30 mg Intravenous Given 09/20/24 1158)                                    Medical Decision Making Amount and/or Complexity of Data Reviewed Labs: ordered. Radiology: ordered.  Risk Prescription drug management.   This patient presents to the ED for concern of hematuria, this involves an extensive number of treatment options, and is a complaint that carries with it a high risk of complications and morbidity.  The differential diagnosis includes kidney stone, uti, pyelo   Co morbidities that complicate the patient evaluation  depression, gerd, melanoma, arthritis, and glaucoma   Additional history obtained:  Additional history obtained from epic chart review  Lab Tests:  I Ordered, and personally interpreted labs.  The pertinent results include:  ua + gross blood, cbc with wbc elevated at 14.4, bmp nl   Imaging Studies ordered:  I ordered imaging studies including ct renal  I independently visualized and interpreted imaging which showed   No  nephrolithiasis, obstructive uropathy, or urothelial mass.  2. Sigmoid diverticulosis without inflammation.  3. Aortic atherosclerosis.  4. Hepatic steatosis.  5. Severe degenerative disc disease at L1-2 and L2-3.   I agree with the radiologist interpretation   Cardiac Monitoring:  The patient was maintained on a cardiac monitor.  I personally viewed and interpreted the cardiac monitored which showed an underlying rhythm of: nsr   Medicines ordered and prescription drug management:  I ordered medication including ivfs, toradol, pyridium, rocephin   for sx  Reevaluation of the patient after these medicines showed that the patient improved I have reviewed the patients home medicines and have made adjustments as needed   Test Considered:  ct   Critical Interventions:  Ivfs/abx   Problem List / ED Course:  Hematuria:  likely hemorrhagic cystitis. Pt had sx last week and partially treated uti.  Pt started on rocephin  in ed and d/c with keflex .  Urine sent for cx.  Pt is stable for d/c.  Return if worse.   Reevaluation:  After the interventions noted above, I reevaluated the patient and found that they have :improved   Social Determinants of Health:  Lives at home   Dispostion:  After consideration of the diagnostic results and the patients response to treatment, I feel that the patent would benefit from discharge with outpatient f/u.       Final diagnoses:  Hemorrhagic cystitis    ED Discharge Orders          Ordered    cephALEXin  (KEFLEX ) 500 MG capsule  3 times daily        09/20/24 1216    phenazopyridine (PYRIDIUM) 200 MG tablet  3 times daily        09/20/24 1216               Dean Clarity, MD 09/20/24 1232

## 2024-09-21 LAB — URINE CULTURE: Culture: <10000 — AB

## 2024-10-15 ENCOUNTER — Ambulatory Visit: Admitting: Family Medicine

## 2024-10-15 ENCOUNTER — Encounter: Payer: Self-pay | Admitting: Family Medicine

## 2024-10-15 VITALS — BP 130/78 | HR 82 | Temp 97.9°F | Wt 139.2 lb

## 2024-10-15 DIAGNOSIS — K219 Gastro-esophageal reflux disease without esophagitis: Secondary | ICD-10-CM

## 2024-10-15 DIAGNOSIS — F419 Anxiety disorder, unspecified: Secondary | ICD-10-CM

## 2024-10-15 DIAGNOSIS — I700 Atherosclerosis of aorta: Secondary | ICD-10-CM

## 2024-10-15 DIAGNOSIS — N3946 Mixed incontinence: Secondary | ICD-10-CM

## 2024-10-15 DIAGNOSIS — N3010 Interstitial cystitis (chronic) without hematuria: Secondary | ICD-10-CM | POA: Diagnosis not present

## 2024-10-15 DIAGNOSIS — G479 Sleep disorder, unspecified: Secondary | ICD-10-CM

## 2024-10-15 DIAGNOSIS — M5386 Other specified dorsopathies, lumbar region: Secondary | ICD-10-CM

## 2024-10-15 DIAGNOSIS — L659 Nonscarring hair loss, unspecified: Secondary | ICD-10-CM | POA: Diagnosis not present

## 2024-10-15 DIAGNOSIS — K580 Irritable bowel syndrome with diarrhea: Secondary | ICD-10-CM | POA: Diagnosis not present

## 2024-10-15 DIAGNOSIS — M8589 Other specified disorders of bone density and structure, multiple sites: Secondary | ICD-10-CM

## 2024-10-15 LAB — IBC + FERRITIN
Ferritin: 45.7 ng/mL (ref 10.0–291.0)
Iron: 91 ug/dL (ref 42–145)
Saturation Ratios: 28.9 % (ref 20.0–50.0)
TIBC: 315 ug/dL (ref 250.0–450.0)
Transferrin: 225 mg/dL (ref 212.0–360.0)

## 2024-10-15 LAB — TSH: TSH: 5.85 u[IU]/mL — ABNORMAL HIGH (ref 0.35–5.50)

## 2024-10-15 MED ORDER — BUPROPION HCL ER (XL) 150 MG PO TB24: 150.0000 mg | ORAL_TABLET | Freq: Every day | ORAL | 0 refills | Status: DC

## 2024-10-15 MED ORDER — ALPRAZOLAM 0.5 MG PO TABS: 0.5000 mg | ORAL_TABLET | Freq: Two times a day (BID) | ORAL | 1 refills | Status: AC | PRN

## 2024-10-15 MED ORDER — BACLOFEN 10 MG PO TABS
5.0000 mg | ORAL_TABLET | Freq: Two times a day (BID) | ORAL | 1 refills | Status: AC
Start: 2024-10-15 — End: ?

## 2024-10-15 MED ORDER — PRAVASTATIN SODIUM 20 MG PO TABS: 20.0000 mg | ORAL_TABLET | Freq: Every day | ORAL | 3 refills | Status: AC

## 2024-10-15 MED ORDER — HYOSCYAMINE SULFATE 0.125 MG SL SUBL: 0.1250 mg | SUBLINGUAL_TABLET | Freq: Three times a day (TID) | SUBLINGUAL | 1 refills | Status: AC | PRN

## 2024-10-15 MED ORDER — MELOXICAM 15 MG PO TABS
15.0000 mg | ORAL_TABLET | Freq: Every day | ORAL | 1 refills | Status: AC
Start: 2024-10-15 — End: ?

## 2024-10-15 MED ORDER — GABAPENTIN 100 MG PO CAPS: 100.0000 mg | ORAL_CAPSULE | Freq: Every day | ORAL | 1 refills | Status: AC

## 2024-10-15 MED ORDER — BUPROPION HCL ER (XL) 150 MG PO TB24: 150.0000 mg | ORAL_TABLET | Freq: Every day | ORAL | 1 refills | Status: DC

## 2024-10-15 MED ORDER — PANTOPRAZOLE SODIUM 40 MG PO TBEC
40.0000 mg | DELAYED_RELEASE_TABLET | Freq: Every day | ORAL | 3 refills | Status: AC
Start: 2024-10-15 — End: ?

## 2024-10-15 MED ORDER — TOLTERODINE TARTRATE ER 4 MG PO CP24: 4.0000 mg | ORAL_CAPSULE | Freq: Every day | ORAL | 1 refills | Status: AC | PRN

## 2024-10-15 NOTE — Progress Notes (Signed)
 Patient ID: Victoria Bryant, female  DOB: 07/26/1946, 78 y.o.   MRN: [id number] Patient Care Team    Relationship Specialty Notifications Start End  Catherine Charlies LABOR, DO PCP - General Family Medicine  06/18/16   Kandyce Sor, MD Consulting Physician Obstetrics and Gynecology  10/06/16   Dyane Rush, MD (Inactive) Consulting Physician Gastroenterology  10/08/16   Aneita Gwendlyn DASEN, MD (Inactive) Consulting Physician Gastroenterology  10/24/18   Claudene Arthea CHRISTELLA, DO Consulting Physician Family Medicine  10/24/18   Shona Rush, MD  Dermatology  10/24/18     Chief Complaint  Patient presents with   Anxiety    Subjective: Victoria Bryant is a 78 y.o.  Female  present for chronic condition management All past medical history, surgical history, allergies, family history, immunizations, medications and social history were updated in the electronic medical record today. All recent labs, ED visits and hospitalizations within the last year were reviewed.  Depression with anxiety/sleep disturbance: Patient reports her anxiety is okay, but she feels lack of motivation and increase in her depressive symptoms.  She uses Xanax  as needed when having increased anxiety or difficulty falling asleep.   She reports she had been on Wellbutrin  in the past and felt like it did help her.  Atherosclerosis of aorta [facility]) Patient reports she has been watching her diet and going to the gym.  She is compliant with pravastatin . She did not desire to take Crestor .   GERD: Condition is well-controlled on protonix  daily. Unable to wean off without recurrent symptoms. She is supplementing b12 and vit d.    Pain emptying bladder/urinary incontinence:  She reports detrol  La is helping with her urinary incontinence.  Recently seen in the ED for hemorrhagic cystitis.  CT negative for any stone, mildly elevated WBC treated with Rocephin  and Keflex .   Scoliosis/left sciatica lumbar region degenerative disease:   Reports her pain is still well-controlled with Mobic  and baclofen  when needed.  Hair thinning: She has noticed hair thinning over the last 6 months.  She does have increased stress.  Thyroid  levels are checked yearly and normal last May.  Vitamin D  levels are well supplemented.  Iron has not been checked in some time.       10/15/2024    8:15 AM 01/04/2024   10:58 AM 10/12/2023    8:21 AM 04/27/2023    8:34 AM 12/29/2022   10:55 AM  Depression screen PHQ 2/9  Decreased Interest 3 0 0 0 0  Down, Depressed, Hopeless 2 0 0 0 0  PHQ - 2 Score 5 0 0 0 0  Altered sleeping 0 0 0 0 0  Tired, decreased energy 3 0 0 0 0  Change in appetite 0 0 0 0 0  Feeling bad or failure about yourself  0 0 0 0 0  Trouble concentrating 0 0 0 0 0  Moving slowly or fidgety/restless 0 0 0 0 0  Suicidal thoughts 0 0 0 0 0  PHQ-9 Score 8 0  0  0  0   Difficult doing work/chores Not difficult at all Not difficult at all Not difficult at all Not difficult at all      Data saved with a previous flowsheet row definition      10/15/2024    8:15 AM 10/12/2023    8:22 AM 04/27/2023    8:34 AM 11/10/2022    8:07 AM  GAD 7 : Generalized Anxiety Score  Nervous, Anxious, on Edge 0  0 0 0  Control/stop worrying 0 0 0 1  Worry too much - different things 0 0 0 0  Trouble relaxing 0 0 0 0  Restless 0 0 0 0  Easily annoyed or irritable 0 0 0 0  Afraid - awful might happen 0 0 0 0  Total GAD 7 Score 0 0 0 1  Anxiety Difficulty Not difficult at all Not difficult at all Not difficult at all       12/29/2022   10:57 AM 04/27/2023    8:34 AM 10/12/2023    8:21 AM 01/04/2024   10:54 AM 10/15/2024    8:15 AM  Fall Risk  Falls in the past year? 0 0 0 0 0  Was there an injury with Fall? 0 0 0 0 0  Fall Risk Category Calculator 0  0 0 0  Patient at Risk for Falls Due to No Fall Risks      Fall risk Follow up Falls evaluation completed    Falls evaluation completed;Education provided;Falls prevention discussed Falls  evaluation completed     Data saved with a previous flowsheet row definition     Immunization History  Administered Date(s) Administered   Fluad Quad(high Dose 65+) 08/07/2019, 08/22/2020   Fluad Trivalent(High Dose 65+) 10/12/2023   INFLUENZA, HIGH DOSE SEASONAL PF 10/06/2015, 12/26/2017, 09/08/2018   Influenza Split 09/20/2011, 09/25/2012   Influenza Whole 08/27/2010   Influenza,inj,Quad PF,6+ Mos 09/26/2013   Influenza-Unspecified 09/17/2014, 09/05/2021, 09/05/2022   Moderna Sars-Covid-2 Vaccination 02/16/2020, 03/15/2020, 10/03/2020   Pneumococcal Conjugate-13 10/06/2015   Pneumococcal Polysaccharide-23 09/20/2011   Td 05/26/2009   Tdap 01/05/2021, 04/05/2021   Zoster Recombinant(Shingrix) 02/08/2022   Zoster, Live 09/25/2012     Past Medical History:  Diagnosis Date   Arthritis    COLONIC POLYPS, HX OF 04/21/2007   Combined forms of age-related cataract of left eye 07/19/2019   COVID-19 11/2021   mild case   DEPRESSION 05/26/2009   Diverticulitis    Diverticulosis of intestine without bleeding 06/09/2017   Epiretinal membrane, left eye    GERD 04/08/2008   Glaucoma suspect of both eyes    [facility]   Interstitial cystitis    ? per pt: was told by urologist she has this   Liver hemangioma 08/27/2020   MELANOMA, MALIGNANT, UPPER LIMB 04/21/2007   MENOPAUSAL SYNDROME 04/21/2007   NECK PAIN, CHRONIC 04/21/2007   Posterior vitreous detachment of both eyes    [facility]   Pseudophakia of both eyes    [facility]   Regular astigmatism, left eye 07/19/2019   Seasonal allergic rhinitis due to pollen 09/04/2024   Unspecified disorder of refraction    both eyes   Allergies  Allergen Reactions   Naproxen  Diarrhea   Paroxetine     REACTION: unspecified   Promethazine     Hyperactive/jittery/insomnia   Clarithromycin Nausea Only   Doxycycline Hyclate Nausea And Vomiting   Erythromycin Ethylsuccinate Nausea Only    Lexapro  [Escitalopram  Oxalate] Other (See Comments)    Paresthesia    Past Surgical History:  Procedure Laterality Date   COLONOSCOPY  x 2   First time polyps, second time no polyps: recall 10 yrs (next is approx 2024 per pt report)   INCISION AND DRAINAGE ABSCESS ANAL     POLYPECTOMY     TONSILLECTOMY     UPPER GASTROINTESTINAL ENDOSCOPY     VAGINAL HYSTERECTOMY     Nonmalignant reason.  Ovaries still in.   Family History  Problem Relation Age of Onset   Bladder Cancer Mother    Lung cancer Mother    Breast cancer Paternal Aunt    ALS Daughter    Fibromyalgia Sister    Heart disease Brother    Alcohol abuse Brother    Colon cancer Neg Hx    Colon polyps Neg Hx    Esophageal cancer Neg Hx    Rectal cancer Neg Hx    Stomach cancer Neg Hx    Social History   Social History Narrative   Married, one daughter lives next door to her.   Retired acupuncturist.   Tob: 20 pack yr hx, quit age 46.   No alc.    Allergies as of 10/15/2024       Reactions   Naproxen  Diarrhea   Paroxetine    REACTION: unspecified   Promethazine    Hyperactive/jittery/insomnia   Clarithromycin Nausea Only   Doxycycline Hyclate Nausea And Vomiting   Erythromycin Ethylsuccinate Nausea Only   Lexapro  [escitalopram  Oxalate] Other (See Comments)   Paresthesia        Medication List        Accurate as of October 15, 2024  9:24 AM. If you have any questions, ask your nurse or doctor.          STOP taking these medications    cephALEXin  500 MG capsule Commonly known as: KEFLEX  Stopped by: Charlies Bellini   phenazopyridine 200 MG tablet Commonly known as: PYRIDIUM Stopped by: Charlies Bellini       TAKE these medications    ALPRAZolam  0.5 MG tablet Commonly known as: Xanax  Take 1 tablet (0.5 mg total) by mouth 2 (two) times daily as needed for anxiety.   baclofen  10 MG tablet Commonly known as: LIORESAL  Take 0.5 tablets (5 mg total) by mouth 2 (two) times daily.    budesonide-formoterol 80-4.5 MCG/ACT inhaler Commonly known as: Symbicort Inhale 2 puffs into the lungs daily.   buPROPion  150 MG 24 hr tablet Commonly known as: Wellbutrin  XL Take 1 tablet (150 mg total) by mouth daily. Started by: Charlies Bellini   cetirizine  10 MG tablet Commonly known as: ZYRTEC  Take 1 tablet (10 mg total) by mouth at bedtime.   fluticasone  50 MCG/ACT nasal spray Commonly known as: FLONASE  Place 1 spray into both nostrils daily.   gabapentin  100 MG capsule Commonly known as: NEURONTIN  Take 1 capsule (100 mg total) by mouth at bedtime. As needed   hyoscyamine  0.125 MG SL tablet Commonly known as: LEVSIN  SL Place 1 tablet (0.125 mg total) under the tongue every 8 (eight) hours as needed.   ipratropium 0.06 % nasal spray Commonly known as: ATROVENT Place 2 sprays into both nostrils 4 (four) times daily.   JUICE PLUS FIBRE PO Take by mouth.   loteprednol 0.5 % ophthalmic suspension Commonly known as: LOTEMAX 1 drop 3 (three) times daily.   meloxicam  15 MG tablet Commonly known as: MOBIC  Take 1 tablet (15 mg total) by mouth daily. As needed   montelukast 10 MG tablet Commonly known as: SINGULAIR Take 1 tablet (10 mg total) by mouth at bedtime.   pantoprazole  40 MG tablet Commonly known as: PROTONIX  Take 1 tablet (40 mg total) by mouth daily.   pravastatin  20 MG tablet Commonly known as: PRAVACHOL  Take 1 tablet (20 mg total) by mouth daily.   RA PROBIOTIC GUMMIES PO Take by mouth as needed.   tolterodine  4 MG 24 hr capsule Commonly known as: DETROL  LA Take 1  capsule (4 mg total) by mouth daily as needed.   VITAMIN B 12 PO Take by mouth.   vitamin C 1000 MG tablet Take 1,000 mg by mouth daily.   Vitamin D  (Cholecalciferol) 25 MCG (1000 UT) Caps Take 2 capsules by mouth daily.        All past medical history, surgical history, allergies, family history, immunizations andmedications were updated in the EMR today and reviewed under the  history and medication portions of their EMR.       No results found.   Review of Systems  All other systems reviewed and are negative.  14 pt review of systems performed and negative (unless mentioned in an HPI)  Objective: BP 130/78   Pulse 82   Temp 97.9 F (36.6 C)   Wt 139 lb 3.2 oz (63.1 kg)   SpO2 98%   BMI 26.30 kg/m  Physical Exam Vitals and nursing note reviewed.  Constitutional:      General: She is not in acute distress.    Appearance: Normal appearance. She is not ill-appearing, toxic-appearing or diaphoretic.     Comments: Generalized hair thinning  HENT:     Head: Normocephalic and atraumatic.  Eyes:     General: No scleral icterus.       Right eye: No discharge.        Left eye: No discharge.     Extraocular Movements: Extraocular movements intact.     Conjunctiva/sclera: Conjunctivae normal.     Pupils: Pupils are equal, round, and reactive to light.  Cardiovascular:     Rate and Rhythm: Normal rate and regular rhythm.     Heart sounds: No murmur heard. Pulmonary:     Effort: Pulmonary effort is normal. No respiratory distress.     Breath sounds: Normal breath sounds. No wheezing, rhonchi or rales.  Musculoskeletal:     Cervical back: Neck supple.     Right lower leg: No edema.     Left lower leg: No edema.  Skin:    General: Skin is warm.     Findings: No rash.  Neurological:     Mental Status: She is alert and oriented to person, place, and time. Mental status is at baseline.     Motor: No weakness.     Gait: Gait normal.  Psychiatric:        Mood and Affect: Mood normal.        Behavior: Behavior normal.        Thought Content: Thought content normal.        Judgment: Judgment normal.    No results found.  Assessment/plan: BERNADENE GARSIDE is a 78 y.o. female present for chronic conditions management pain emptying bladder/urinary incontinence:  Stable Continue Detrol  LA.    Scoliosis, left sciatica lumbar region degenerative  disease:  Stable Continue baclofen  1/2 tab BID PRN Continue Mobic  daily.   Sleep disturbance/anxiety/depression Stable Continue Xanax  nightly as needed, when having difficulty sleeping secondary to anxiety. [place]  controlled substance database reviewed 10/15/24 Add Wellbutrin  150 XL today to help with increase in motivation and depressive symptoms.  Leg cramps Stable Continue gabapentin  prn (rarely uses)  GERD long-term current use of proton pump inhibitor therapy Stable Continue Protonix  40 mg daily Vitamin D , B12 and magnesium UTD 2025  Lipid screening/Atherosclerosis of aorta (HCC) Continue pravastatin . Labs up-to-date 2025  irritable bowel syndrome with diarrhea Continue bentyl prn  Osteopenia of multiple sites DEXA- UTD 2025 (-1.5) Continue vitamin D  supplementation  Hair thinning:  Discussed rash and illness can cause hair thinning. TSH and iron panel collected today   Return in about 28 weeks (around 04/29/2025) for cpe (20 min), Routine chronic condition follow-up.  Orders Placed This Encounter  Procedures   TSH   IBC + Ferritin   Meds ordered this encounter  Medications   gabapentin  (NEURONTIN ) 100 MG capsule    Sig: Take 1 capsule (100 mg total) by mouth at bedtime. As needed    Dispense:  90 capsule    Refill:  1   hyoscyamine  (LEVSIN  SL) 0.125 MG SL tablet    Sig: Place 1 tablet (0.125 mg total) under the tongue every 8 (eight) hours as needed.    Dispense:  90 tablet    Refill:  1   meloxicam  (MOBIC ) 15 MG tablet    Sig: Take 1 tablet (15 mg total) by mouth daily. As needed    Dispense:  90 tablet    Refill:  1   tolterodine  (DETROL  LA) 4 MG 24 hr capsule    Sig: Take 1 capsule (4 mg total) by mouth daily as needed.    Dispense:  90 capsule    Refill:  1   baclofen  (LIORESAL ) 10 MG tablet    Sig: Take 0.5 tablets (5 mg total) by mouth 2 (two) times daily.    Dispense:  45 each    Refill:  1   pantoprazole  (PROTONIX ) 40 MG tablet     Sig: Take 1 tablet (40 mg total) by mouth daily.    Dispense:  90 tablet    Refill:  3   pravastatin  (PRAVACHOL ) 20 MG tablet    Sig: Take 1 tablet (20 mg total) by mouth daily.    Dispense:  90 tablet    Refill:  3   DISCONTD: buPROPion  (WELLBUTRIN  XL) 150 MG 24 hr tablet    Sig: Take 1 tablet (150 mg total) by mouth daily.    Dispense:  90 tablet    Refill:  1   buPROPion  (WELLBUTRIN  XL) 150 MG 24 hr tablet    Sig: Take 1 tablet (150 mg total) by mouth daily.    Dispense:  30 tablet    Refill:  0   ALPRAZolam  (XANAX ) 0.5 MG tablet    Sig: Take 1 tablet (0.5 mg total) by mouth 2 (two) times daily as needed for anxiety.    Dispense:  180 tablet    Refill:  1   Referral Orders  No referral(s) requested today     Electronically signed by: Charlies Bellini, DO [facility]

## 2024-10-15 NOTE — Patient Instructions (Addendum)

## 2024-10-16 ENCOUNTER — Ambulatory Visit: Payer: Self-pay | Admitting: Family Medicine

## 2024-10-17 NOTE — Telephone Encounter (Signed)
 No further action needed at this time.

## 2024-11-11 ENCOUNTER — Other Ambulatory Visit: Payer: Self-pay | Admitting: Family Medicine

## 2024-12-03 ENCOUNTER — Other Ambulatory Visit: Payer: Self-pay | Admitting: Family Medicine

## 2024-12-16 ENCOUNTER — Encounter: Payer: Self-pay | Admitting: Family Medicine

## 2024-12-21 ENCOUNTER — Ambulatory Visit: Admitting: Sports Medicine

## 2024-12-21 ENCOUNTER — Encounter: Payer: Self-pay | Admitting: Sports Medicine

## 2024-12-21 VITALS — BP 148/84 | HR 82 | Temp 97.7°F | Wt 137.1 lb

## 2024-12-21 DIAGNOSIS — R0982 Postnasal drip: Secondary | ICD-10-CM | POA: Diagnosis not present

## 2024-12-21 DIAGNOSIS — H9193 Unspecified hearing loss, bilateral: Secondary | ICD-10-CM

## 2024-12-21 DIAGNOSIS — I10 Essential (primary) hypertension: Secondary | ICD-10-CM | POA: Diagnosis not present

## 2024-12-21 DIAGNOSIS — H938X3 Other specified disorders of ear, bilateral: Secondary | ICD-10-CM

## 2024-12-21 NOTE — Progress Notes (Signed)
 "  Careteam: Patient Care Team: Catherine Charlies LABOR, DO as PCP - General (Family Medicine) Kandyce Sor, MD as Consulting Physician (Obstetrics and Gynecology) Dyane Rush, MD (Inactive) as Consulting Physician (Gastroenterology) Aneita Gwendlyn DASEN, MD (Inactive) as Consulting Physician (Gastroenterology) Claudene Arthea HERO, DO as Consulting Physician (Family Medicine) Shona Rush, MD (Dermatology)  Allergies[1]  Chief Complaint  Patient presents with   Ear Fullness    Pt has been having ear fullness since July. Pt has been having issues and would like a referral to ENT and audiology. She was seen at [facility] and was not happy with them.    Discussed the use of AI scribe software for clinical note transcription with the patient, who gave verbal consent to proceed.  History of Present Illness  Victoria Bryant is a 79 year old female who presents with bilateral ear fullness and hearing loss.  She has experienced a sensation of ear fullness and decreased hearing since July 2025, following an episode of cold and flu-like symptoms. The symptoms have persisted for approximately six months, affecting both ears. She describes the sensation as similar to the pressure experienced at high altitudes, with a popping feeling, and notes difficulty hearing conversations unless she is nearby.  She has visited urgent care twice and was referred to an ENT in [place], where she was diagnosed with moderate to severe hearing loss but no fluid in the ears. However, a subsequent visit to another doctor revealed fluid in the ears, and she was prescribed a nasal spray, Singulair , and Zyrtec , which she took at night without relief. She discontinued the allergy medications due to severe dry mouth and gum recession, which were noted by her dentist.  She experiences post-nasal drip mainly at night and has a sensation of fluid or water in the ears, described as 'ocean' and 'crickets' sounds. No ringing in  the ears, cough, or sore throat. She reports occasional dizziness and balance issues, such as bumping into door frames, but no falls related to these symptoms.  She takes Flonase  regularly. She reports that her blood pressure is usually normal at home. No chest pain, shortness of breath, or other systemic symptoms.    Review of Systems:  Review of Systems  Constitutional:  Negative for chills and fever.  HENT:  Positive for hearing loss and tinnitus. Negative for congestion and sore throat.        Ear fullness  Eyes:  Negative for double vision.  Respiratory:  Negative for cough, sputum production and shortness of breath.   Cardiovascular:  Negative for chest pain, palpitations and leg swelling.  Gastrointestinal:  Negative for abdominal pain, heartburn and nausea.  Genitourinary:  Negative for dysuria, frequency and hematuria.  Musculoskeletal:  Negative for falls and myalgias.  Neurological:  Negative for dizziness, sensory change and focal weakness.   Negative unless indicated in HPI.   Patient Active Problem List   Diagnosis Date Noted   Reactive airway disease 09/04/2024   Osteopenia of multiple sites 04/27/2024   Facet arthritis, degenerative, cervical spine 04/05/2024   Anxiousness 11/10/2022   Irritable bowel syndrome with diarrhea 05/25/2022   Urinary incontinence 05/25/2022   Long-term current use of proton pump inhibitor therapy 06/02/2021   Interstitial cystitis    Scoliosis of lumbar region due to degenerative disease of spine in adult 05/31/2018   Atherosclerosis of aorta 05/31/2018   Sciatica of right side associated with disorder of lumbar spine 05/04/2018   Sleep disturbance 06/30/2017   Elevated TSH 06/09/2017  GERD 04/08/2008   History of colonic polyps 04/21/2007   Past Medical History:  Diagnosis Date   Allergy    Arthritis    COLONIC POLYPS, HX OF 04/21/2007   Combined forms of age-related cataract of left eye 07/19/2019   COVID-19 11/2021   mild  case   DEPRESSION 05/26/2009   Diverticulitis    Diverticulosis of intestine without bleeding 06/09/2017   Epiretinal membrane, left eye    GERD 04/08/2008   Glaucoma suspect of both eyes    [facility]   Interstitial cystitis    ? per pt: was told by urologist she has this   Liver hemangioma 08/27/2020   MELANOMA, MALIGNANT, UPPER LIMB 04/21/2007   MENOPAUSAL SYNDROME 04/21/2007   NECK PAIN, CHRONIC 04/21/2007   Posterior vitreous detachment of both eyes    [facility]   Pseudophakia of both eyes    [facility]   Regular astigmatism, left eye 07/19/2019   Seasonal allergic rhinitis due to pollen 09/04/2024   Unspecified disorder of refraction    both eyes   Past Surgical History:  Procedure Laterality Date   COLONOSCOPY  x 2   First time polyps, second time no polyps: recall 10 yrs (next is approx 2024 per pt report)   INCISION AND DRAINAGE ABSCESS ANAL     POLYPECTOMY     TONSILLECTOMY     UPPER GASTROINTESTINAL ENDOSCOPY     VAGINAL HYSTERECTOMY     Nonmalignant reason.  Ovaries still in.   Social History[2] Family History  Problem Relation Age of Onset   Bladder Cancer Mother    Lung cancer Mother    Breast cancer Paternal Aunt    ALS Daughter    Fibromyalgia Sister    Heart disease Brother    Alcohol abuse Brother    Colon cancer Neg Hx    Colon polyps Neg Hx    Esophageal cancer Neg Hx    Rectal cancer Neg Hx    Stomach cancer Neg Hx    Allergies[3]  Medications: Patient's Medications  New Prescriptions   No medications on file  Previous Medications   ALPRAZOLAM  (XANAX ) 0.5 MG TABLET    Take 1 tablet (0.5 mg total) by mouth 2 (two) times daily as needed for anxiety.   ASCORBIC ACID (VITAMIN C) 1000 MG TABLET    Take 1,000 mg by mouth daily.   BACLOFEN  (LIORESAL ) 10 MG TABLET    Take 0.5 tablets (5 mg total) by mouth 2 (two) times daily.   BUDESONIDE -FORMOTEROL  (SYMBICORT ) 80-4.5 MCG/ACT INHALER    INHALE 2  PUFFS BY MOUTH INTO THE LUNGS DAILY   BUPROPION  (WELLBUTRIN  XL) 150 MG 24 HR TABLET    TAKE 1 TABLET BY MOUTH EVERY DAY   CETIRIZINE  (ZYRTEC ) 10 MG TABLET    Take 1 tablet (10 mg total) by mouth at bedtime.   CYANOCOBALAMIN  (VITAMIN B 12 PO)    Take by mouth.   FLUTICASONE  (FLONASE ) 50 MCG/ACT NASAL SPRAY    Place 1 spray into both nostrils daily.   GABAPENTIN  (NEURONTIN ) 100 MG CAPSULE    Take 1 capsule (100 mg total) by mouth at bedtime. As needed   HYOSCYAMINE  (LEVSIN  SL) 0.125 MG SL TABLET    Place 1 tablet (0.125 mg total) under the tongue every 8 (eight) hours as needed.   IPRATROPIUM (ATROVENT ) 0.06 % NASAL SPRAY    Place 2 sprays into both nostrils 4 (four) times daily.   LOTEPREDNOL (LOTEMAX) 0.5 % OPHTHALMIC  SUSPENSION    1 drop 3 (three) times daily.   MELOXICAM  (MOBIC ) 15 MG TABLET    Take 1 tablet (15 mg total) by mouth daily. As needed   MONTELUKAST  (SINGULAIR ) 10 MG TABLET    Take 1 tablet (10 mg total) by mouth at bedtime.   NUTRITIONAL SUPPLEMENTS (JUICE PLUS FIBRE PO)    Take by mouth.   PANTOPRAZOLE  (PROTONIX ) 40 MG TABLET    Take 1 tablet (40 mg total) by mouth daily.   PRAVASTATIN  (PRAVACHOL ) 20 MG TABLET    Take 1 tablet (20 mg total) by mouth daily.   PROBIOTIC PRODUCT (RA PROBIOTIC GUMMIES PO)    Take by mouth as needed.   TOLTERODINE  (DETROL  LA) 4 MG 24 HR CAPSULE    Take 1 capsule (4 mg total) by mouth daily as needed.   VITAMIN D , CHOLECALCIFEROL, 25 MCG (1000 UT) CAPS    Take 2 capsules by mouth daily.  Modified Medications   No medications on file  Discontinued Medications   No medications on file    Physical Exam: Vitals:   12/21/24 1322 12/21/24 1331  BP: (!) 154/83 (!) 148/84  Pulse: 82   Temp: 97.7 F (36.5 C)   TempSrc: Oral   SpO2: 97%   Weight: 137 lb 1.9 oz (62.2 kg)    Body mass index is 25.91 kg/m. BP Readings from Last 3 Encounters:  12/21/24 (!) 148/84  10/15/24 130/78  09/20/24 (!) 155/70   Wt Readings from Last 3 Encounters:   12/21/24 137 lb 1.9 oz (62.2 kg)  10/15/24 139 lb 3.2 oz (63.1 kg)  09/20/24 140 lb (63.5 kg)    Physical Exam Constitutional:      Appearance: Normal appearance.  HENT:     Head: Normocephalic and atraumatic.     Right Ear: Tympanic membrane normal.     Left Ear: Tympanic membrane normal.     Mouth/Throat:     Pharynx: No oropharyngeal exudate or posterior oropharyngeal erythema.     Comments: No sinus tenderness Cardiovascular:     Rate and Rhythm: Normal rate and regular rhythm.  Pulmonary:     Effort: Pulmonary effort is normal. No respiratory distress.     Breath sounds: Normal breath sounds. No wheezing.  Abdominal:     General: Bowel sounds are normal. There is no distension.     Tenderness: There is no abdominal tenderness. There is no guarding or rebound.     Comments:    Musculoskeletal:        General: No swelling or tenderness.  Skin:    General: Skin is dry.  Neurological:     Mental Status: She is alert. Mental status is at baseline.     Sensory: No sensory deficit.     Motor: No weakness.     Labs reviewed: Basic Metabolic Panel: Recent Labs    04/27/24 0830 09/20/24 1123 10/15/24 0845  NA 140 139  --   K 4.7 3.8  --   CL 100 103  --   CO2 32 26  --   GLUCOSE 91 91  --   BUN 16 11  --   CREATININE 0.77 0.77  --   CALCIUM  9.9 9.5  --   MG 2.2  --   --   TSH 4.75  --  5.85*   Liver Function Tests: Recent Labs    04/27/24 0830  AST 18  ALT 15  ALKPHOS 70  BILITOT 0.5  PROT 7.3  ALBUMIN  4.8   No results for input(s): LIPASE, AMYLASE in the last 8760 hours. No results for input(s): AMMONIA in the last 8760 hours. CBC: Recent Labs    04/27/24 0830 09/20/24 1123  WBC 8.5 14.4*  NEUTROABS  --  8.1*  HGB 13.3 13.3  HCT 40.4 39.4  MCV 90.6 90.8  PLT 274.0 232   Lipid Panel: Recent Labs    04/27/24 0830  CHOL 189  HDL 77.70  LDLCALC 87  TRIG 122.0  CHOLHDL 2   TSH: Recent Labs    04/27/24 0830 10/15/24 0845  TSH  4.75 5.85*   A1C: Lab Results  Component Value Date   HGBA1C 5.8 04/27/2024    Assessment & Plan Sensation of fullness in both ears Pt c/o ear fullness, tinnitus , diminished hearing bilaterally  Rinne test positive AC > BC  Weber test - equal on both sides Orders:   Ambulatory referral to Audiology   Ambulatory referral to ENT  Diminished hearing, bilateral  Orders:   Ambulatory referral to Audiology   Ambulatory referral to ENT  Primary hypertension Bp slightly high  Pt states home bp readings are below 140's  Monitor bp daily and keep a log Avoid salty foods Exercise regularly     Post-nasal drip Post pharyngeal wall - no erythema No sinus tenderness Cont with flonase  Take antihistamine     No follow-ups on file.:   Victoria Bryant     [1]  Allergies Allergen Reactions   Naproxen  Diarrhea   Paroxetine     REACTION: unspecified   Promethazine     Hyperactive/jittery/insomnia   Clarithromycin Nausea Only   Doxycycline Hyclate Nausea And Vomiting   Erythromycin Ethylsuccinate Nausea Only   Lexapro  [Escitalopram  Oxalate] Other (See Comments)    Paresthesia   [2]  Social History Tobacco Use   Smoking status: Former    Current packs/day: 0.00    Types: Cigarettes    Quit date: 12/06/1973    Years since quitting: 51.0   Smokeless tobacco: Never  Vaping Use   Vaping status: Never Used  Substance Use Topics   Alcohol use: No   Drug use: No  [3]  Allergies Allergen Reactions   Naproxen  Diarrhea   Paroxetine     REACTION: unspecified   Promethazine     Hyperactive/jittery/insomnia   Clarithromycin Nausea Only   Doxycycline Hyclate Nausea And Vomiting   Erythromycin Ethylsuccinate Nausea Only   Lexapro  [Escitalopram  Oxalate] Other (See Comments)    Paresthesia    "

## 2024-12-28 ENCOUNTER — Telehealth: Payer: Self-pay

## 2024-12-28 DIAGNOSIS — Z85828 Personal history of other malignant neoplasm of skin: Secondary | ICD-10-CM

## 2024-12-28 NOTE — Telephone Encounter (Signed)
 Copied from CRM [id number]. Topic: Referral - Request for Referral >> Dec 28, 2024 11:37 AM [name] wrote: Did the patient discuss referral with their provider in the last year? Yes (If No - schedule appointment) (If Yes - send message)  Appointment offered? No  Type of order/referral and detailed reason for visit: Annual check up with Dermatology. Patient needing referral for [facility].  Preference of office, provider, location: Dr. Norleen Hurst - Dermatologist [address] [facility]  If referral order, have you been seen by this specialty before? Yes (If Yes, this issue or another issue? When? Where?  Can we respond through MyChart? Yes

## 2025-01-01 ENCOUNTER — Encounter: Payer: Self-pay | Admitting: Family Medicine

## 2025-01-01 ENCOUNTER — Ambulatory Visit: Admitting: Audiologist

## 2025-01-01 ENCOUNTER — Telehealth: Payer: Self-pay

## 2025-01-01 DIAGNOSIS — M1711 Unilateral primary osteoarthritis, right knee: Secondary | ICD-10-CM

## 2025-01-01 NOTE — Telephone Encounter (Signed)
 Referral placed

## 2025-01-01 NOTE — Telephone Encounter (Signed)
 Okay to place referral?

## 2025-01-01 NOTE — Addendum Note (Signed)
 Addended by: GEORGEAN BEEN A on: 01/01/2025 01:33 PM   Modules accepted: Orders

## 2025-01-01 NOTE — Telephone Encounter (Signed)
 Referral placed for [facility]. Please advise.

## 2025-01-01 NOTE — Telephone Encounter (Signed)
 Copied from CRM #[id number]. Topic: Referral - Request for Referral >> Dec 28, 2024  4:19 PM Rea ORN wrote: Did the patient discuss referral with their provider in the last year? No (If No - schedule appointment) (If Yes - send message)  Appointment offered? Yes, Pt stated she has been going to [facility] for 2 years and now [facility] wants a referral done.   Type of order/referral and detailed reason for visit: Right knee Gel injections every 6 months, 3 shots one week apart. Next appt 01/04/25, then 01/11/25 and last 01/18/25  Preference of office, provider, location: [facility] [address] [place], [place] [address], Dr Medford Pae Phone: [phone] Fax: [phone]  If referral order, have you been seen by this specialty before? Yes (If Yes, this issue or another issue? When? Where?  Can we respond through MyChart? No  Pt asking to be called back to advise if she can have this done prior to her 1/30 appt, [phone]

## 2025-01-02 NOTE — Telephone Encounter (Signed)
 Done

## 2025-01-02 NOTE — Telephone Encounter (Signed)
 No further action needed at this time.

## 2025-01-03 ENCOUNTER — Encounter (INDEPENDENT_AMBULATORY_CARE_PROVIDER_SITE_OTHER): Payer: Self-pay | Admitting: Physician Assistant

## 2025-01-03 ENCOUNTER — Ambulatory Visit (INDEPENDENT_AMBULATORY_CARE_PROVIDER_SITE_OTHER): Admitting: Physician Assistant

## 2025-01-03 VITALS — BP 154/94 | HR 79 | Ht 62.0 in | Wt 137.0 lb

## 2025-01-03 DIAGNOSIS — H905 Unspecified sensorineural hearing loss: Secondary | ICD-10-CM

## 2025-01-03 DIAGNOSIS — H9313 Tinnitus, bilateral: Secondary | ICD-10-CM

## 2025-01-03 DIAGNOSIS — H6993 Unspecified Eustachian tube disorder, bilateral: Secondary | ICD-10-CM

## 2025-01-03 NOTE — Progress Notes (Signed)
 Patient not on BP medication. Patient explains that she had coffee before coming in. She also explains that her BP is usually in a normal range. Patient also says that she has white coat syndrome. Advised that patient take BP when she gets home. If still elevated see PCP.

## 2025-01-03 NOTE — Progress Notes (Signed)
 "     Dear Dr. Sherlynn, Here is my assessment for our mutual patient, Victoria Bryant. Thank you for allowing me the opportunity to care for your patient. Please do not hesitate to contact me should you have any other questions. Sincerely, Chyrl Cohen PA-C  Otolaryngology Clinic Note Referring provider: Dr. Sherlynn HPI:  Victoria Bryant is a 79 y.o. female kindly referred by Dr. Sherlynn   Discussed the use of AI scribe software for clinical note transcription with the patient, who gave verbal consent to proceed.  History of Present Illness   Victoria Bryant is a 79 year old female who presents with persistent bilateral aural fullness, hearing loss, and tinnitus following an upper respiratory infection.  Since a severe upper respiratory infection in July 2025, she has experienced persistent bilateral aural fullness, described as a sensation of pressure similar to altitude changes, with intermittent popping and cracking. The fullness is present daily without significant fluctuation, and the right ear is more affected than the left.  She also reports bilateral tinnitus, described as crickets and ocean sound. Hearing loss has been noted since symptom onset, with increased difficulty in communication, including missing words and frequently asking for repetition, which has impacted her daily interactions and cognitive engagement. She has no prior history of otologic disease, infections, or trauma, and no significant history of allergies.  She underwent prior ENT evaluation with audiometry demonstrating moderate to severe hearing loss. Her primary care provider subsequently noted ears full of fluid and initiated treatment with oral steroids, montelukast , and cetirizine , without symptomatic improvement. She has also used intranasal fluticasone  daily for several months without benefit and reports dry mouth as a side effect. Despite these interventions, she continues to experience  thick postnasal drainage, particularly at night, with frequent sensation of drainage down the posterior pharynx.           Independent Review of Additional Tests or Records:  none   PMH/Meds/All/SocHx/FamHx/ROS:   Past Medical History:  Diagnosis Date   Allergy    Arthritis    COLONIC POLYPS, HX OF 04/21/2007   Combined forms of age-related cataract of left eye 07/19/2019   COVID-19 11/2021   mild case   DEPRESSION 05/26/2009   Diverticulitis    Diverticulosis of intestine without bleeding 06/09/2017   Epiretinal membrane, left eye    GERD 04/08/2008   Glaucoma suspect of both eyes    [facility]   Interstitial cystitis    ? per pt: was told by urologist she has this   Liver hemangioma 08/27/2020   MELANOMA, MALIGNANT, UPPER LIMB 04/21/2007   MENOPAUSAL SYNDROME 04/21/2007   NECK PAIN, CHRONIC 04/21/2007   Posterior vitreous detachment of both eyes    [facility]   Pseudophakia of both eyes    [facility]   Regular astigmatism, left eye 07/19/2019   Seasonal allergic rhinitis due to pollen 09/04/2024   Unspecified disorder of refraction    both eyes     Past Surgical History:  Procedure Laterality Date   COLONOSCOPY  x 2   First time polyps, second time no polyps: recall 10 yrs (next is approx 2024 per pt report)   INCISION AND DRAINAGE ABSCESS ANAL     POLYPECTOMY     TONSILLECTOMY     UPPER GASTROINTESTINAL ENDOSCOPY     VAGINAL HYSTERECTOMY     Nonmalignant reason.  Ovaries still in.    Family History  Problem Relation Age of Onset   Bladder Cancer  Mother    Lung cancer Mother    Breast cancer Paternal Aunt    ALS Daughter    Fibromyalgia Sister    Heart disease Brother    Alcohol abuse Brother    Colon cancer Neg Hx    Colon polyps Neg Hx    Esophageal cancer Neg Hx    Rectal cancer Neg Hx    Stomach cancer Neg Hx      Social Connections: Unknown (10/14/2024)   Social Connection and Isolation Panel     Frequency of Communication with Friends and Family: Patient declined    Frequency of Social Gatherings with Friends and Family: Patient declined    Attends Religious Services: Patient declined    Database Administrator or Organizations: Patient declined    Attends Engineer, Structural: Not on file    Marital Status: Patient declined     Current Medications[1]   Physical Exam:   BP (!) 154/94   Pulse 79   Ht 5' 2 (1.575 m)   Wt 137 lb (62.1 kg)   SpO2 96%   BMI 25.06 kg/m   Pertinent Findings  CN II-XII grossly intact Bilateral EAC clear and TM intact with well pneumatized middle ear spaces Anterior rhinoscopy: Septum midline; bilateral inferior turbinates with no hypertrophy No lesions of oral cavity/oropharynx; dentition wnl No obviously palpable neck masses/lymphadenopathy/thyromegaly No respiratory distress or stridor      Seprately Identifiable Procedures:  None  Impression & Plans:  Victoria Bryant is a 79 y.o. female with the following   Assessment and Plan    Eustachian tube dysfunction Persistent bilateral aural fullness, popping, and crackling consistent with Eustachian tube dysfunction. Previous treatments ineffective. - Discontinued Flonase , Zyrtec , and Singulair  as they have not provided relief and causing sides effects.  - Recommended completion of scheduled audiogram at [facility] audiology to evaluate for conductive hearing loss. - Scheduled follow-up phone visit to review audiogram results   Sensorineural hearing loss Moderate to severe hearing loss with abrupt onset post-upper respiratory infection. Etiology unclear; further audiometric evaluation required. - Instructed completion of scheduled audiogram at [facility] audiology to assess for sensorineural versus conductive hearing loss. - Planned follow-up phone visit to review audiogram results and discuss potential benefit of hearing aids if sensorineural hearing loss is  confirmed.  Tinnitus Bilateral tinnitus likely secondary to underlying hearing loss. Hearing aids may improve symptoms if significant hearing loss is present. - Discussed that hearing aids may improve tinnitus symptoms if significant hearing loss is present. Will revisit management after audiogram results are available          - f/u phone office visit with audio results    Thank you for allowing me the opportunity to care for your patient. Please do not hesitate to contact me should you have any other questions.  Sincerely, Chyrl Cohen PA-C [facility] Phone: [phone] Fax: [phone]  01/03/2025, 2:29 PM        [1]  Current Outpatient Medications:    ALPRAZolam  (XANAX ) 0.5 MG tablet, Take 1 tablet (0.5 mg total) by mouth 2 (two) times daily as needed for anxiety., Disp: 180 tablet, Rfl: 1   Ascorbic Acid (VITAMIN C) 1000 MG tablet, Take 1,000 mg by mouth daily., Disp: , Rfl:    baclofen  (LIORESAL ) 10 MG tablet, Take 0.5 tablets (5 mg total) by mouth 2 (two) times daily., Disp: 45 each, Rfl: 1   budesonide -formoterol  (SYMBICORT ) 80-4.5 MCG/ACT inhaler, INHALE 2 PUFFS BY MOUTH INTO THE  LUNGS DAILY, Disp: 10.2 each, Rfl: 1   buPROPion  (WELLBUTRIN  XL) 150 MG 24 hr tablet, TAKE 1 TABLET BY MOUTH EVERY DAY, Disp: 30 tablet, Rfl: 0   cetirizine  (ZYRTEC ) 10 MG tablet, Take 1 tablet (10 mg total) by mouth at bedtime. (Patient not taking: Reported on 12/21/2024), Disp: 30 tablet, Rfl: 11   Cyanocobalamin  (VITAMIN B 12 PO), Take by mouth., Disp: , Rfl:    fluticasone  (FLONASE ) 50 MCG/ACT nasal spray, Place 1 spray into both nostrils daily., Disp: 48 g, Rfl: 3   gabapentin  (NEURONTIN ) 100 MG capsule, Take 1 capsule (100 mg total) by mouth at bedtime. As needed, Disp: 90 capsule, Rfl: 1   hyoscyamine  (LEVSIN  SL) 0.125 MG SL tablet, Place 1 tablet (0.125 mg total) under the tongue every 8 (eight) hours as needed., Disp: 90 tablet, Rfl: 1   ipratropium (ATROVENT ) 0.06 %  nasal spray, Place 2 sprays into both nostrils 4 (four) times daily., Disp: 15 mL, Rfl: 12   loteprednol (LOTEMAX) 0.5 % ophthalmic suspension, 1 drop 3 (three) times daily., Disp: , Rfl:    meloxicam  (MOBIC ) 15 MG tablet, Take 1 tablet (15 mg total) by mouth daily. As needed, Disp: 90 tablet, Rfl: 1   montelukast  (SINGULAIR ) 10 MG tablet, Take 1 tablet (10 mg total) by mouth at bedtime., Disp: 30 tablet, Rfl: 11   Nutritional Supplements (JUICE PLUS FIBRE PO), Take by mouth., Disp: , Rfl:    pantoprazole  (PROTONIX ) 40 MG tablet, Take 1 tablet (40 mg total) by mouth daily., Disp: 90 tablet, Rfl: 3   pravastatin  (PRAVACHOL ) 20 MG tablet, Take 1 tablet (20 mg total) by mouth daily., Disp: 90 tablet, Rfl: 3   Probiotic Product (RA PROBIOTIC GUMMIES PO), Take by mouth as needed., Disp: , Rfl:    tolterodine  (DETROL  LA) 4 MG 24 hr capsule, Take 1 capsule (4 mg total) by mouth daily as needed., Disp: 90 capsule, Rfl: 1   Vitamin D , Cholecalciferol, 25 MCG (1000 UT) CAPS, Take 2 capsules by mouth daily., Disp: , Rfl:   "

## 2025-01-10 ENCOUNTER — Ambulatory Visit: Admitting: Audiology

## 2025-01-11 NOTE — Telephone Encounter (Signed)
 No further action needed.

## 2025-01-14 ENCOUNTER — Ambulatory Visit (INDEPENDENT_AMBULATORY_CARE_PROVIDER_SITE_OTHER): Admitting: Physician Assistant

## 2025-01-23 ENCOUNTER — Ambulatory Visit: Admitting: Audiologist

## 2025-02-13 ENCOUNTER — Ambulatory Visit

## 2025-04-30 ENCOUNTER — Encounter: Admitting: Family Medicine
